# Patient Record
Sex: Female | Born: 1950
Health system: Southern US, Community
[De-identification: ages and names within clinical notes are randomized; demographics above are authoritative.]

## PROBLEM LIST (undated history)

## (undated) DIAGNOSIS — K635 Polyp of colon: Secondary | ICD-10-CM

## (undated) DIAGNOSIS — K219 Gastro-esophageal reflux disease without esophagitis: Secondary | ICD-10-CM

## (undated) DIAGNOSIS — N809 Endometriosis, unspecified: Secondary | ICD-10-CM

## (undated) DIAGNOSIS — J309 Allergic rhinitis, unspecified: Secondary | ICD-10-CM

## (undated) DIAGNOSIS — I10 Essential (primary) hypertension: Secondary | ICD-10-CM

## (undated) DIAGNOSIS — M1990 Unspecified osteoarthritis, unspecified site: Secondary | ICD-10-CM

## (undated) DIAGNOSIS — Z9889 Other specified postprocedural states: Secondary | ICD-10-CM

## (undated) DIAGNOSIS — T7840XA Allergy, unspecified, initial encounter: Secondary | ICD-10-CM

## (undated) DIAGNOSIS — Z5189 Encounter for other specified aftercare: Secondary | ICD-10-CM

## (undated) DIAGNOSIS — D126 Benign neoplasm of colon, unspecified: Secondary | ICD-10-CM

## (undated) DIAGNOSIS — K589 Irritable bowel syndrome without diarrhea: Secondary | ICD-10-CM

## (undated) DIAGNOSIS — E785 Hyperlipidemia, unspecified: Secondary | ICD-10-CM

## (undated) DIAGNOSIS — E119 Type 2 diabetes mellitus without complications: Secondary | ICD-10-CM

## (undated) DIAGNOSIS — Z86010 Personal history of colonic polyps: Secondary | ICD-10-CM

## (undated) DIAGNOSIS — R112 Nausea with vomiting, unspecified: Secondary | ICD-10-CM

## (undated) DIAGNOSIS — H269 Unspecified cataract: Secondary | ICD-10-CM

## (undated) HISTORY — PX: COLONOSCOPY W/ POLYPECTOMY: SHX1380

## (undated) HISTORY — DX: Allergy, unspecified, initial encounter: T78.40XA

## (undated) HISTORY — DX: Gastro-esophageal reflux disease without esophagitis: K21.9

## (undated) HISTORY — DX: Unspecified cataract: H26.9

## (undated) HISTORY — DX: Endometriosis, unspecified: N80.9

## (undated) HISTORY — DX: Essential (primary) hypertension: I10

## (undated) HISTORY — DX: Irritable bowel syndrome, unspecified: K58.9

## (undated) HISTORY — DX: Benign neoplasm of colon, unspecified: D12.6

## (undated) HISTORY — DX: Polyp of colon: K63.5

## (undated) HISTORY — DX: Hyperlipidemia, unspecified: E78.5

## (undated) HISTORY — DX: Allergic rhinitis, unspecified: J30.9

## (undated) HISTORY — DX: Type 2 diabetes mellitus without complications: E11.9

## (undated) HISTORY — DX: Encounter for other specified aftercare: Z51.89

## (undated) HISTORY — DX: Personal history of colonic polyps: Z86.010

## (undated) HISTORY — DX: Unspecified osteoarthritis, unspecified site: M19.90

---
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
## 1983-01-11 HISTORY — PX: MANDIBLE SURGERY: SHX707

## 1984-01-11 HISTORY — PX: LAPAROSCOPY: SHX197

## 1985-01-10 DIAGNOSIS — Z5189 Encounter for other specified aftercare: Secondary | ICD-10-CM

## 1985-01-10 HISTORY — DX: Encounter for other specified aftercare: Z51.89

## 1988-01-11 HISTORY — PX: BREAST BIOPSY: SHX20

## 1997-06-10 ENCOUNTER — Other Ambulatory Visit: Admission: RE | Admit: 1997-06-10 | Discharge: 1997-06-10 | Payer: Self-pay | Admitting: Obstetrics and Gynecology

## 1999-07-26 ENCOUNTER — Other Ambulatory Visit: Admission: RE | Admit: 1999-07-26 | Discharge: 1999-07-26 | Payer: Self-pay | Admitting: Obstetrics and Gynecology

## 2000-12-18 ENCOUNTER — Other Ambulatory Visit: Admission: RE | Admit: 2000-12-18 | Discharge: 2000-12-18 | Payer: Self-pay | Admitting: Obstetrics and Gynecology

## 2002-05-23 ENCOUNTER — Other Ambulatory Visit: Admission: RE | Admit: 2002-05-23 | Discharge: 2002-05-23 | Payer: Self-pay | Admitting: Obstetrics and Gynecology

## 2003-06-03 ENCOUNTER — Other Ambulatory Visit: Admission: RE | Admit: 2003-06-03 | Discharge: 2003-06-03 | Payer: Self-pay | Admitting: Obstetrics and Gynecology

## 2003-06-16 DIAGNOSIS — Z860100 Personal history of colon polyps, unspecified: Secondary | ICD-10-CM

## 2003-06-16 HISTORY — DX: Personal history of colon polyps, unspecified: Z86.0100

## 2003-06-16 HISTORY — DX: Personal history of colonic polyps: Z86.010

## 2003-06-16 LAB — HM COLONOSCOPY: HM Colonoscopy: NORMAL

## 2007-02-12 ENCOUNTER — Ambulatory Visit: Payer: Self-pay | Admitting: Internal Medicine

## 2007-02-12 DIAGNOSIS — J309 Allergic rhinitis, unspecified: Secondary | ICD-10-CM | POA: Insufficient documentation

## 2007-02-12 DIAGNOSIS — Z86010 Personal history of colonic polyps: Secondary | ICD-10-CM

## 2007-02-12 LAB — CONVERTED CEMR LAB
Albumin: 4.3 g/dL (ref 3.5–5.2)
Alkaline Phosphatase: 76 units/L (ref 39–117)
BUN: 19 mg/dL (ref 6–23)
Basophils Absolute: 0 10*3/uL (ref 0.0–0.1)
Cholesterol: 211 mg/dL (ref 0–200)
Creatinine, Ser: 0.8 mg/dL (ref 0.4–1.2)
Direct LDL: 137.5 mg/dL
Eosinophils Absolute: 0.1 10*3/uL (ref 0.0–0.6)
GFR calc Af Amer: 95 mL/min
GFR calc non Af Amer: 79 mL/min
Hemoglobin: 13.5 g/dL (ref 12.0–15.0)
MCHC: 33.2 g/dL (ref 30.0–36.0)
MCV: 91.6 fL (ref 78.0–100.0)
Monocytes Absolute: 0.6 10*3/uL (ref 0.2–0.7)
Monocytes Relative: 8 % (ref 3.0–11.0)
Neutro Abs: 4 10*3/uL (ref 1.4–7.7)
Potassium: 5.4 meq/L — ABNORMAL HIGH (ref 3.5–5.1)
RDW: 12.3 % (ref 11.5–14.6)
Sodium: 143 meq/L (ref 135–145)
TSH: 0.99 microintl units/mL (ref 0.35–5.50)
Total CHOL/HDL Ratio: 4.3

## 2008-05-05 ENCOUNTER — Encounter (INDEPENDENT_AMBULATORY_CARE_PROVIDER_SITE_OTHER): Payer: Self-pay | Admitting: *Deleted

## 2010-04-06 ENCOUNTER — Encounter: Payer: Self-pay | Admitting: Internal Medicine

## 2010-07-19 ENCOUNTER — Other Ambulatory Visit (INDEPENDENT_AMBULATORY_CARE_PROVIDER_SITE_OTHER): Payer: BC Managed Care – PPO

## 2010-07-19 DIAGNOSIS — Z0000 Encounter for general adult medical examination without abnormal findings: Secondary | ICD-10-CM

## 2010-07-19 LAB — CBC WITH DIFFERENTIAL/PLATELET
Basophils Relative: 0.2 % (ref 0.0–3.0)
Eosinophils Absolute: 0.1 10*3/uL (ref 0.0–0.7)
Eosinophils Relative: 0.8 % (ref 0.0–5.0)
Hemoglobin: 13.1 g/dL (ref 12.0–15.0)
Lymphocytes Relative: 35.2 % (ref 12.0–46.0)
MCHC: 34.5 g/dL (ref 30.0–36.0)
MCV: 91.3 fl (ref 78.0–100.0)
Monocytes Absolute: 0.5 10*3/uL (ref 0.1–1.0)
Neutro Abs: 3.8 10*3/uL (ref 1.4–7.7)
Neutrophils Relative %: 56.5 % (ref 43.0–77.0)
RBC: 4.15 Mil/uL (ref 3.87–5.11)
WBC: 6.7 10*3/uL (ref 4.5–10.5)

## 2010-07-19 LAB — TSH: TSH: 1.15 u[IU]/mL (ref 0.35–5.50)

## 2010-07-19 LAB — HEPATIC FUNCTION PANEL
AST: 19 U/L (ref 0–37)
Albumin: 4.5 g/dL (ref 3.5–5.2)
Total Bilirubin: 0.4 mg/dL (ref 0.3–1.2)

## 2010-07-19 LAB — LIPID PANEL
HDL: 43.5 mg/dL (ref >39.00)
LDL Cholesterol: 109 mg/dL — ABNORMAL HIGH (ref 0–99)
Total CHOL/HDL Ratio: 4
Triglycerides: 154 mg/dL — ABNORMAL HIGH (ref 0.0–149.0)

## 2010-07-19 LAB — BASIC METABOLIC PANEL
CO2: 27 mEq/L (ref 19–32)
Chloride: 107 mEq/L (ref 96–112)
Potassium: 4.2 mEq/L (ref 3.5–5.1)
Sodium: 142 mEq/L (ref 135–145)

## 2010-07-19 LAB — POCT URINALYSIS DIPSTICK
Bilirubin, UA: NEGATIVE
Glucose, UA: NEGATIVE
Nitrite, UA: NEGATIVE
Urobilinogen, UA: 0.2

## 2010-07-21 ENCOUNTER — Encounter: Payer: Self-pay | Admitting: Internal Medicine

## 2010-07-26 ENCOUNTER — Ambulatory Visit (INDEPENDENT_AMBULATORY_CARE_PROVIDER_SITE_OTHER): Payer: BC Managed Care – PPO | Admitting: Internal Medicine

## 2010-07-26 ENCOUNTER — Encounter: Payer: Self-pay | Admitting: Internal Medicine

## 2010-07-26 VITALS — BP 138/80 | HR 80 | Temp 98.1°F | Resp 16 | Ht 62.0 in | Wt 145.0 lb

## 2010-07-26 DIAGNOSIS — Z0000 Encounter for general adult medical examination without abnormal findings: Secondary | ICD-10-CM

## 2010-07-26 MED ORDER — FLUTICASONE PROPIONATE 50 MCG/ACT NA SUSP: 2.0000 | Freq: Every day | NASAL | Status: AC

## 2010-07-26 NOTE — Progress Notes (Signed)
Subjective:    Patient ID: Stephanie Grimes, female    DOB: January 02, 1951, 60 y.o.   MRN: [id number]  HPI    History of Present Illness:   60 year-old patient seen today to reestablish with our practice. She has a history of vasomotor rhinitis resulted in rhinorrhea and some chronic cough. Otherwise, she has no concerns or complaints. She did have a colonoscopy with Dr. Jarold Motto in 2007 was did reveal a single polyp. Sees Dr. Katrinka Blazing for gynecologic here annually  Her main complaint is frequent paroxysms of coughing. This is not a new complaint and has been a problem for years. She has been evaluated by allergy and skin test and on 2 occasions has been normal. She was told that antihistamines would not be effective and this has been confirmed clinically. She does seem to benefit from nasal steroids.  Current Allergies:   ! SULF-10 (SULFACETAMIDE SODIUM)   Past Medical History:  Colonic polyps, hx of  vasomotor rhinitis   Past Surgical History:  jaw surgery in 1986  breast biopsy 1987   Family History:  father died age 40, bladder cancer, possibly pancreatic cancer  mother died age 43, lung cancer-nonsmoker  One brother one sister are in good health   Social History:  Retired Engineer, site with a masters degree  Married  presently residing at the household is a 80 year old mother-in-law, a daughter and grandchild; son-in-law is in [place]    Review of Systems  Constitutional: Negative for fever, appetite change, fatigue and unexpected weight change.  HENT: Negative for hearing loss, ear pain, nosebleeds, congestion, sore throat, mouth sores, trouble swallowing, neck stiffness, dental problem, voice change, sinus pressure and tinnitus.   Eyes: Negative for photophobia, pain, redness and visual disturbance.  Respiratory: Negative for cough, chest tightness and shortness of breath.   Cardiovascular: Negative for chest pain, palpitations and leg swelling.  Gastrointestinal: Negative for  nausea, vomiting, abdominal pain, diarrhea, constipation, blood in stool, abdominal distention and rectal pain.  Genitourinary: Negative for dysuria, urgency, frequency, hematuria, flank pain, vaginal bleeding, vaginal discharge, difficulty urinating, genital sores, vaginal pain, menstrual problem and pelvic pain.  Musculoskeletal: Negative for back pain and arthralgias.  Skin: Negative for rash.  Neurological: Negative for dizziness, syncope, speech difficulty, weakness, light-headedness, numbness and headaches.  Hematological: Negative for adenopathy. Does not bruise/bleed easily.  Psychiatric/Behavioral: Negative for suicidal ideas, behavioral problems, self-injury, dysphoric mood and agitation. The patient is not nervous/anxious.        Objective:   Physical Exam  Constitutional: She is oriented to person, place, and time. She appears well-developed and well-nourished.  HENT:  Head: Normocephalic and atraumatic.  Right Ear: External ear normal.  Left Ear: External ear normal.  Mouth/Throat: Oropharynx is clear and moist.  Eyes: Conjunctivae and EOM are normal.  Neck: Normal range of motion. Neck supple. No JVD present. No thyromegaly present.  Cardiovascular: Normal rate, regular rhythm, normal heart sounds and intact distal pulses.   No murmur heard. Pulmonary/Chest: Effort normal and breath sounds normal. She has no wheezes. She has no rales.  Abdominal: Soft. Bowel sounds are normal. She exhibits no distension and no mass. There is no tenderness. There is no rebound and no guarding.  Musculoskeletal: Normal range of motion. She exhibits no edema and no tenderness.  Neurological: She is alert and oriented to person, place, and time. She has normal reflexes. No cranial nerve deficit. She exhibits normal muscle tone. Coordination normal.  Skin: Skin is warm and dry. No rash noted.  Psychiatric: She has a normal mood and affect. Her behavior is normal.          Assessment & Plan:     Annual health examination History colonic polyps.  With our GI contacted the patient earlier this year after a five-year interval and told her she did not need a followup examination at this time. I suspect the patient perhaps had a single hyperplastic polyp 5 years ago. She will call the GI office to further investigate

## 2010-07-26 NOTE — Patient Instructions (Signed)
It is important that you exercise regularly, at least 20 minutes 3 to 4 times per week.  If you develop chest pain or shortness of breath seek  medical attention.  Call or return to clinic prn if these symptoms worsen or fail to improve as anticipated.  

## 2010-09-01 ENCOUNTER — Encounter: Payer: Self-pay | Admitting: Gastroenterology

## 2010-09-23 ENCOUNTER — Encounter: Payer: Self-pay | Admitting: *Deleted

## 2010-09-24 ENCOUNTER — Encounter: Payer: Self-pay | Admitting: Gastroenterology

## 2010-09-24 ENCOUNTER — Ambulatory Visit (INDEPENDENT_AMBULATORY_CARE_PROVIDER_SITE_OTHER): Payer: BC Managed Care – PPO | Admitting: Gastroenterology

## 2010-09-24 VITALS — BP 128/80 | HR 76 | Ht 62.0 in | Wt 143.0 lb

## 2010-09-24 DIAGNOSIS — R0989 Other specified symptoms and signs involving the circulatory and respiratory systems: Secondary | ICD-10-CM

## 2010-09-24 DIAGNOSIS — K219 Gastro-esophageal reflux disease without esophagitis: Secondary | ICD-10-CM | POA: Insufficient documentation

## 2010-09-24 DIAGNOSIS — I7300 Raynaud's syndrome without gangrene: Secondary | ICD-10-CM

## 2010-09-24 DIAGNOSIS — F458 Other somatoform disorders: Secondary | ICD-10-CM

## 2010-09-24 MED ORDER — RABEPRAZOLE SODIUM 20 MG PO TBEC: 20.0000 mg | DELAYED_RELEASE_TABLET | Freq: Every day | ORAL | Status: DC

## 2010-09-24 NOTE — Progress Notes (Signed)
History of Present Illness:  This is a 60 year old Caucasian female with several months of a globus sensation in her throat, hoarseness, and coughing without true reflux symptoms. Allergy workup has been normal, chest x-ray normal, she otherwise is in good health. There is no history of dysphagia, anorexia, weight loss, or any systemic or hepatobiliary symptoms. She has not had previous endoscopy or barium swallows. She does have a past history of irritable bowel syndrome. Previous colonoscopy was unremarkable. On close questioning, the patient does seem to have Raynaud's phenomenon but no real arthritis or recurrent skin rashes. She does suffer from recurrent oral stomatitis. She is up-to-date on her dental exams. She follows a regular diet and has no specific food intolerances.  I have reviewed this patient's present history, medical and surgical past history, allergies and medications.     ROS: The remainder of the 10 point ROS is negative     Physical Exam: Examination of the oral pharyngeal area is unremarkable General well developed well nourished patient in no acute distress, appearing her stated age Eyes PERRLA, no icterus, fundoscopic exam per opthamologist Skin no lesions noted Neck supple, no adenopathy, no thyroid enlargement, no tenderness Chest clear to percussion and auscultation Heart no significant murmurs, gallops or rubs noted Abdomen no hepatosplenomegaly masses or tenderness, BS normal. . Extremities no acute joint lesions, edema, phlebitis or evidence of cellulitis. Neurologic patient oriented x 3, cranial nerves intact, no focal neurologic deficits noted. Psychological mental status normal and normal affect.  Assessment and plan: Possible Raynaud's phenomenon with associated esophageal motility disturbance that leads to chronic GERD and hypersensitive esophagus. With a normal chest x-ray, negative allergy workup, and no history of ACE medications, it is highly likely  that her cough is related to GERD. I reviewed her reflux regime with this patient and will start AcipHex 20 mg a day. She's been advised to take this before the first meal of the day. Previous therapy with omeprazole caused severe diarrhea. If she does well I will see her back in 2 months time, and explained to her that these atypical GERD manifestations require months of regular acid suppressive therapy to improve her symptoms. Colonoscopy followup as per clinical protocol.  No diagnosis found.

## 2010-09-24 NOTE — Patient Instructions (Signed)
Take Aciphex once a day 30 min before breakfast. Make an office visit for 2 months to see Dr Jarold Motto  Gastroesophageal Reflux Disease (GERD) Your caregiver has diagnosed your chest discomfort as caused by gastroesophageal reflux disease (GERD). GERD is caused by a reflux of acid from your stomach into the digestive tube between your mouth and stomach (esophagus). Acid in contact with the esophagus causes soreness (inflammation) resulting in heartburn or chest pain. It may cause small holes in the lining of the esophagus (ulcers). CAUSES  Increased body weight puts pressure on the stomach, making acid rise.   Smoking increases acid production.   Alcohol decreases pressure on the valve between the stomach and esophagus (lower esophageal sphincter), allowing acid from the stomach into the esophagus.   Late evening meals and a full stomach increase pressure and acid production.   Lower esophageal sphincter is malformed.   Sometimes, no reason is found.  HOME CARE INSTRUCTIONS  Change the factors that you can control. Weight, smoking, or alcohol changes may be difficult to change on your own. Your caregiver can provide guidance and medical therapy.   Raising the head of your bed may help you to sleep.   Over-the-counter medicines will decrease acid production. Your caregiver can also prescribe medicines for this. Only take over-the-counter or prescription medicines for pain, discomfort, or fever as directed by your caregiver.   1/2 to 1 teaspoon of an antacid taken every hour while awake, with meals, and at bedtime, can neutralize acid.   DO NOT take aspirin, ibuprofen, or other nonsteroidal anti-inflammatory drugs (NSAIDs).  SEEK IMMEDIATE MEDICAL CARE IF:  The pain changes in location (radiates into arms, neck, jaw, teeth, or back), intensity, or duration.   You start feeling sick to your stomach (nauseous), start throwing up (vomiting), or sweating (diaphoresis).   You develop left  arm or jaw pain.   You develop pain going into your back, shortness of breath, or you pass out.   There is vomiting of fluid that is green, yellow, or looks like coffee grounds or blood.  These symptoms could signal other problems, such as heart disease. MAKE SURE YOU:  Understand these instructions.   Will watch your condition.   Will get help right away if you are not doing well or get worse.  Document Released: 10/06/2004 Document Re-Released: 03/23/2009 [facility] Patient Information 2011 [place], [place].    Diet for GERD or PUD Nutrition therapy can help ease the discomfort of gastroesophageal reflux disease (GERD) and peptic ulcer disease (PUD).  HOME CARE INSTRUCTIONS  Eat your meals slowly, in a relaxed setting.   Eat 5 to 6 small meals per day.   If a food causes distress, stop eating it for a period of time.  FOODS TO AVOID:  Coffee, regular or decaffeinated.  Cola beverages, regular or low calorie.   Tea, regular or decaffeinated.   Pepper.   Cocoa.   High fat foods including meats.   Butter, margarine, hydrogenated oil (trans fats).  Peppermint or spearmint (if you have GERD).   Fruits and vegetables as tolerated.   Alcoholic beverages.   Nicotine (smoking or chewing). This is one of the most potent stimulants to acid production in the gastrointestinal tract.   Any food that seems to aggravate your condition.   If you have questions regarding your diet, call your caregiver's office or a registered dietitian. OTHER TIPS IF YOU HAVE GERD:  Lying flat may make symptoms worse. Keep the head of your bed  raised 6 to 9 inches by using a foam wedge or blocks under the legs of the bed.   Do not lay down until 3 hours after eating a meal.   Daily physical activity may help reduce symptoms.  MAKE SURE YOU:   Understand these instructions.   Will watch your condition.   Will get help right away if you are not doing well or get worse.  Document Released:  12/27/2004 Document Re-Released: 05/15/2008 [facility] Patient Information 2011 [place], [place].

## 2010-09-27 ENCOUNTER — Encounter: Payer: Self-pay | Admitting: Gastroenterology

## 2013-05-31 ENCOUNTER — Other Ambulatory Visit (INDEPENDENT_AMBULATORY_CARE_PROVIDER_SITE_OTHER): Payer: BC Managed Care – PPO

## 2013-05-31 DIAGNOSIS — Z0000 Encounter for general adult medical examination without abnormal findings: Secondary | ICD-10-CM

## 2013-05-31 LAB — BASIC METABOLIC PANEL
BUN: 20 mg/dL (ref 6–23)
CALCIUM: 9.2 mg/dL (ref 8.4–10.5)
CO2: 28 mEq/L (ref 19–32)
Chloride: 106 mEq/L (ref 96–112)
Creatinine, Ser: 0.7 mg/dL (ref 0.4–1.2)
GFR: 97.91 mL/min (ref >60.00)
GLUCOSE: 115 mg/dL — AB (ref 70–99)
Potassium: 4.1 mEq/L (ref 3.5–5.1)
SODIUM: 141 meq/L (ref 135–145)

## 2013-05-31 LAB — CBC WITH DIFFERENTIAL/PLATELET
BASOS ABS: 0 10*3/uL (ref 0.0–0.1)
Basophils Relative: 0.4 % (ref 0.0–3.0)
Eosinophils Absolute: 0.1 10*3/uL (ref 0.0–0.7)
Eosinophils Relative: 1.4 % (ref 0.0–5.0)
HEMATOCRIT: 38.8 % (ref 36.0–46.0)
Hemoglobin: 13.1 g/dL (ref 12.0–15.0)
LYMPHS ABS: 2.4 10*3/uL (ref 0.7–4.0)
Lymphocytes Relative: 33.6 % (ref 12.0–46.0)
MCHC: 33.8 g/dL (ref 30.0–36.0)
MCV: 91.6 fl (ref 78.0–100.0)
MONO ABS: 0.5 10*3/uL (ref 0.1–1.0)
Monocytes Relative: 7.3 % (ref 3.0–12.0)
Neutro Abs: 4.1 10*3/uL (ref 1.4–7.7)
Neutrophils Relative %: 57.3 % (ref 43.0–77.0)
PLATELETS: 232 10*3/uL (ref 150.0–400.0)
RBC: 4.24 Mil/uL (ref 3.87–5.11)
RDW: 13.6 % (ref 11.5–15.5)
WBC: 7.2 10*3/uL (ref 4.0–10.5)

## 2013-05-31 LAB — LIPID PANEL
Cholesterol: 198 mg/dL (ref 0–200)
HDL: 47 mg/dL (ref >39.00)
LDL Cholesterol: 127 mg/dL — ABNORMAL HIGH (ref 0–99)
TRIGLYCERIDES: 121 mg/dL (ref 0.0–149.0)
Total CHOL/HDL Ratio: 4
VLDL: 24.2 mg/dL (ref 0.0–40.0)

## 2013-05-31 LAB — POCT URINALYSIS DIPSTICK
BILIRUBIN UA: NEGATIVE
Glucose, UA: NEGATIVE
KETONES UA: NEGATIVE
Nitrite, UA: POSITIVE
PH UA: 5
Protein, UA: NEGATIVE
Spec Grav, UA: 1.025
Urobilinogen, UA: 0.2

## 2013-05-31 LAB — HEPATIC FUNCTION PANEL
ALK PHOS: 64 U/L (ref 39–117)
ALT: 20 U/L (ref 0–35)
AST: 16 U/L (ref 0–37)
Albumin: 3.9 g/dL (ref 3.5–5.2)
BILIRUBIN DIRECT: 0 mg/dL (ref 0.0–0.3)
TOTAL PROTEIN: 6.8 g/dL (ref 6.0–8.3)
Total Bilirubin: 0.6 mg/dL (ref 0.2–1.2)

## 2013-05-31 LAB — TSH: TSH: 0.93 u[IU]/mL (ref 0.35–4.50)

## 2013-06-07 ENCOUNTER — Ambulatory Visit (INDEPENDENT_AMBULATORY_CARE_PROVIDER_SITE_OTHER): Payer: BC Managed Care – PPO | Admitting: Internal Medicine

## 2013-06-07 ENCOUNTER — Encounter: Payer: Self-pay | Admitting: Internal Medicine

## 2013-06-07 VITALS — BP 160/90 | HR 80 | Temp 98.2°F | Resp 20 | Ht 61.5 in | Wt 144.0 lb

## 2013-06-07 DIAGNOSIS — Z23 Encounter for immunization: Secondary | ICD-10-CM

## 2013-06-07 DIAGNOSIS — K219 Gastro-esophageal reflux disease without esophagitis: Secondary | ICD-10-CM

## 2013-06-07 DIAGNOSIS — I7300 Raynaud's syndrome without gangrene: Secondary | ICD-10-CM

## 2013-06-07 DIAGNOSIS — J309 Allergic rhinitis, unspecified: Secondary | ICD-10-CM

## 2013-06-07 DIAGNOSIS — Z0000 Encounter for general adult medical examination without abnormal findings: Secondary | ICD-10-CM

## 2013-06-07 DIAGNOSIS — Z86010 Personal history of colonic polyps: Secondary | ICD-10-CM

## 2013-06-07 DIAGNOSIS — R7302 Impaired glucose tolerance (oral): Secondary | ICD-10-CM | POA: Insufficient documentation

## 2013-06-07 DIAGNOSIS — R7309 Other abnormal glucose: Secondary | ICD-10-CM

## 2013-06-07 NOTE — Progress Notes (Signed)
   Subjective:    Patient ID: Stephanie Grimes, female    DOB: 05/14/1950, 63 y.o.   MRN: [id number]  HPI 63 year-old patient seen today for a health maintenance exam.  She has a history of vasomotor rhinitis with  rhinorrhea and some chronic cough. Otherwise, she has no concerns or complaints. She did have a colonoscopy with Dr. Sharlett Iles in 2005  was did reveal a single polyp. Sees Dr. Tamala Julian for gynecologic here annually   Current Allergies:  ! SULF-10 (SULFACETAMIDE SODIUM)   Past Medical History:  Colonic polyps, hx of  vasomotor rhinitis  GERD IGT  Past Surgical History:  jaw surgery in 1986  breast biopsy 1987   Family History:  father died age 63, bladder cancer, possibly pancreatic cancer  mother died age 49, lung cancer-nonsmoker  One brother one sister are in good health    Social History:  Retired Education officer, museum with a masters degree  Married 2 grandchildren      Review of Systems  Constitutional: Negative for fever, appetite change, fatigue and unexpected weight change.  HENT: Positive for postnasal drip. Negative for congestion, dental problem, ear pain, hearing loss, mouth sores, nosebleeds, sinus pressure, sore throat, tinnitus, trouble swallowing and voice change.   Eyes: Negative for photophobia, pain, redness and visual disturbance.  Respiratory: Positive for cough. Negative for chest tightness and shortness of breath.   Cardiovascular: Negative for chest pain, palpitations and leg swelling.  Gastrointestinal: Negative for nausea, vomiting, abdominal pain, diarrhea, constipation, blood in stool, abdominal distention and rectal pain.  Genitourinary: Negative for dysuria, urgency, frequency, hematuria, flank pain, vaginal bleeding, vaginal discharge, difficulty urinating, genital sores, vaginal pain, menstrual problem and pelvic pain.  Musculoskeletal: Negative for arthralgias, back pain and neck stiffness.  Skin: Negative for rash.  Neurological: Negative for  dizziness, syncope, speech difficulty, weakness, light-headedness, numbness and headaches.  Hematological: Negative for adenopathy. Does not bruise/bleed easily.  Psychiatric/Behavioral: Negative for suicidal ideas, behavioral problems, self-injury, dysphoric mood and agitation. The patient is not nervous/anxious.        Objective:   Physical Exam  Constitutional: She is oriented to person, place, and time. She appears well-developed and well-nourished.  HENT:  Head: Normocephalic and atraumatic.  Right Ear: External ear normal.  Left Ear: External ear normal.  Mouth/Throat: Oropharynx is clear and moist.  Eyes: Conjunctivae and EOM are normal.  Neck: Normal range of motion. Neck supple. No JVD present. No thyromegaly present.  Cardiovascular: Normal rate, regular rhythm, normal heart sounds and intact distal pulses.   No murmur heard. Pulmonary/Chest: Effort normal and breath sounds normal. She has no wheezes. She has no rales.  Abdominal: Soft. Bowel sounds are normal. She exhibits no distension and no mass. There is no tenderness. There is no rebound and no guarding.  Musculoskeletal: Normal range of motion. She exhibits no edema and no tenderness.  Neurological: She is alert and oriented to person, place, and time. She has normal reflexes. No cranial nerve deficit. She exhibits normal muscle tone. Coordination normal.  Skin: Skin is warm and dry. No rash noted.  Psychiatric: She has a normal mood and affect. Her behavior is normal.          Assessment & Plan:  Preventive health examination Vasomotor rhinitis.  Continue nasal steroids  Needs  follow up colonoscopy

## 2013-06-07 NOTE — Progress Notes (Signed)
Pre-visit discussion using our clinic review tool. No additional management support is needed unless otherwise documented below in the visit note.  

## 2013-06-07 NOTE — Patient Instructions (Addendum)
Health Maintenance, Female A healthy lifestyle and preventative care can promote health and wellness.  Maintain regular health, dental, and eye exams.  Eat a healthy diet. Foods like vegetables, fruits, whole grains, low-fat dairy products, and lean protein foods contain the nutrients you need without too many calories. Decrease your intake of foods high in solid fats, added sugars, and salt. Get information about a proper diet from your caregiver, if necessary.  Regular physical exercise is one of the most important things you can do for your health. Most adults should get at least 150 minutes of moderate-intensity exercise (any activity that increases your heart rate and causes you to sweat) each week. In addition, most adults need muscle-strengthening exercises on 2 or more days a week.   Maintain a healthy weight. The body mass index (BMI) is a screening tool to identify possible weight problems. It provides an estimate of body fat based on height and weight. Your caregiver can help determine your BMI, and can help you achieve or maintain a healthy weight. For adults 20 years and older:  A BMI below 18.5 is considered underweight.  A BMI of 18.5 to 24.9 is normal.  A BMI of 25 to 29.9 is considered overweight.  A BMI of 30 and above is considered obese.  Maintain normal blood lipids and cholesterol by exercising and minimizing your intake of saturated fat. Eat a balanced diet with plenty of fruits and vegetables. Blood tests for lipids and cholesterol should begin at age 45 and be repeated every 5 years. If your lipid or cholesterol levels are high, you are over 50, or you are a high risk for heart disease, you may need your cholesterol levels checked more frequently.Ongoing high lipid and cholesterol levels should be treated with medicines if diet and exercise are not effective.  If you smoke, find out from your caregiver how to quit. If you do not use tobacco, do not start.  Lung  cancer screening is recommended for adults aged 63 80 years who are at high risk for developing lung cancer because of a history of smoking. Yearly low-dose computed tomography (CT) is recommended for people who have at least a 30-pack-year history of smoking and are a current smoker or have quit within the past 15 years. A pack year of smoking is smoking an average of 1 pack of cigarettes a day for 1 year (for example: 1 pack a day for 30 years or 2 packs a day for 15 years). Yearly screening should continue until the smoker has stopped smoking for at least 15 years. Yearly screening should also be stopped for people who develop a health problem that would prevent them from having lung cancer treatment.  If you are pregnant, do not drink alcohol. If you are breastfeeding, be very cautious about drinking alcohol. If you are not pregnant and choose to drink alcohol, do not exceed 1 drink per day. One drink is considered to be 12 ounces (355 mL) of beer, 5 ounces (148 mL) of wine, or 1.5 ounces (44 mL) of liquor.  Avoid use of street drugs. Do not share needles with anyone. Ask for help if you need support or instructions about stopping the use of drugs.  High blood pressure causes heart disease and increases the risk of stroke. Blood pressure should be checked at least every 1 to 2 years. Ongoing high blood pressure should be treated with medicines, if weight loss and exercise are not effective.  If you are 55 to  63 years old, ask your caregiver if you should take aspirin to prevent strokes.  Diabetes screening involves taking a blood sample to check your fasting blood sugar level. This should be done once every 3 years, after age 10, if you are within normal weight and without risk factors for diabetes. Testing should be considered at a younger age or be carried out more frequently if you are overweight and have at least 1 risk factor for diabetes.  Breast cancer screening is essential preventative care  for women. You should practice "breast self-awareness." This means understanding the normal appearance and feel of your breasts and may include breast self-examination. Any changes detected, no matter how small, should be reported to a caregiver. Women in their 56s and 30s should have a clinical breast exam (CBE) by a caregiver as part of a regular health exam every 1 to 3 years. After age 41, women should have a CBE every year. Starting at age 2, women should consider having a mammogram (breast X-ray) every year. Women who have a family history of breast cancer should talk to their caregiver about genetic screening. Women at a high risk of breast cancer should talk to their caregiver about having an MRI and a mammogram every year.  Breast cancer gene (BRCA)-related cancer risk assessment is recommended for women who have family members with BRCA-related cancers. BRCA-related cancers include breast, ovarian, tubal, and peritoneal cancers. Having family members with these cancers may be associated with an increased risk for harmful changes (mutations) in the breast cancer genes BRCA1 and BRCA2. Results of the assessment will determine the need for genetic counseling and BRCA1 and BRCA2 testing.  The Pap test is a screening test for cervical cancer. Women should have a Pap test starting at age 30. Between ages 53 and 36, Pap tests should be repeated every 2 years. Beginning at age 25, you should have a Pap test every 3 years as long as the past 3 Pap tests have been normal. If you had a hysterectomy for a problem that was not cancer or a condition that could lead to cancer, then you no longer need Pap tests. If you are between ages 34 and 44, and you have had normal Pap tests going back 10 years, you no longer need Pap tests. If you have had past treatment for cervical cancer or a condition that could lead to cancer, you need Pap tests and screening for cancer for at least 20 years after your treatment. If Pap  tests have been discontinued, risk factors (such as a new sexual partner) need to be reassessed to determine if screening should be resumed. Some women have medical problems that increase the chance of getting cervical cancer. In these cases, your caregiver may recommend more frequent screening and Pap tests.  The human papillomavirus (HPV) test is an additional test that may be used for cervical cancer screening. The HPV test looks for the virus that can cause the cell changes on the cervix. The cells collected during the Pap test can be tested for HPV. The HPV test could be used to screen women aged 68 years and older, and should be used in women of any age who have unclear Pap test results. After the age of 26, women should have HPV testing at the same frequency as a Pap test.  Colorectal cancer can be detected and often prevented. Most routine colorectal cancer screening begins at the age of 24 and continues through age 27. However, your caregiver  may recommend screening at an earlier age if you have risk factors for colon cancer. On a yearly basis, your caregiver may provide home test kits to check for hidden blood in the stool. Use of a small camera at the end of a tube, to directly examine the colon (sigmoidoscopy or colonoscopy), can detect the earliest forms of colorectal cancer. Talk to your caregiver about this at age 48, when routine screening begins. Direct examination of the colon should be repeated every 5 to 10 years through age 57, unless early forms of pre-cancerous polyps or small growths are found.  Hepatitis C blood testing is recommended for all people born from [birth year] through 1965 and any individual with known risks for hepatitis C.  Practice safe sex. Use condoms and avoid high-risk sexual practices to reduce the spread of sexually transmitted infections (STIs). Sexually active women aged 74 and younger should be checked for Chlamydia, which is a common sexually transmitted infection.  Older women with new or multiple partners should also be tested for Chlamydia. Testing for other STIs is recommended if you are sexually active and at increased risk.  Osteoporosis is a disease in which the bones lose minerals and strength with aging. This can result in serious bone fractures. The risk of osteoporosis can be identified using a bone density scan. Women ages 21 and over and women at risk for fractures or osteoporosis should discuss screening with their caregivers. Ask your caregiver whether you should be taking a calcium supplement or vitamin D to reduce the rate of osteoporosis.  Menopause can be associated with physical symptoms and risks. Hormone replacement therapy is available to decrease symptoms and risks. You should talk to your caregiver about whether hormone replacement therapy is right for you.  Use sunscreen. Apply sunscreen liberally and repeatedly throughout the day. You should seek shade when your shadow is shorter than you. Protect yourself by wearing long sleeves, pants, a wide-brimmed hat, and sunglasses year round, whenever you are outdoors.  Notify your caregiver of new moles or changes in moles, especially if there is a change in shape or color. Also notify your caregiver if a mole is larger than the size of a pencil eraser.  Stay current with your immunizations. Document Released: 07/12/2010 Document Revised: 04/23/2012 Document Reviewed: 07/12/2010 [facility] Patient Information 2014 [place].  Schedule your colonoscopy to help detect colon cancer.

## 2013-06-12 ENCOUNTER — Encounter: Payer: Self-pay | Admitting: Internal Medicine

## 2013-07-09 ENCOUNTER — Encounter: Payer: Self-pay | Admitting: Gynecology

## 2013-07-18 ENCOUNTER — Ambulatory Visit (AMBULATORY_SURGERY_CENTER): Payer: BC Managed Care – PPO | Admitting: *Deleted

## 2013-07-18 VITALS — Ht 61.75 in | Wt 143.6 lb

## 2013-07-18 DIAGNOSIS — Z1211 Encounter for screening for malignant neoplasm of colon: Secondary | ICD-10-CM

## 2013-07-18 NOTE — Progress Notes (Signed)
No allergies to eggs or soy. No problems with anesthesia.  No oxygen use  No diet drug use  During PV pt says she is having problems with reflux and chronic cough due to reflux. She is taking Pepcid and says that cough is getting worse.  She says that she would like to see Dr Hilarie Fredrickson about this problem.  Colonoscopy cancelled and New pt appt scheduled for 9/15 with Dr. Hilarie Fredrickson.

## 2013-07-24 ENCOUNTER — Encounter: Payer: Self-pay | Admitting: Gynecology

## 2013-07-24 ENCOUNTER — Ambulatory Visit (INDEPENDENT_AMBULATORY_CARE_PROVIDER_SITE_OTHER): Payer: BC Managed Care – PPO | Admitting: Gynecology

## 2013-07-24 VITALS — BP 118/84 | HR 72 | Resp 16 | Ht 61.0 in | Wt 142.0 lb

## 2013-07-24 DIAGNOSIS — Z01419 Encounter for gynecological examination (general) (routine) without abnormal findings: Secondary | ICD-10-CM

## 2013-07-24 DIAGNOSIS — N762 Acute vulvitis: Secondary | ICD-10-CM

## 2013-07-24 DIAGNOSIS — R053 Chronic cough: Secondary | ICD-10-CM

## 2013-07-24 DIAGNOSIS — Z124 Encounter for screening for malignant neoplasm of cervix: Secondary | ICD-10-CM

## 2013-07-24 DIAGNOSIS — N760 Acute vaginitis: Secondary | ICD-10-CM

## 2013-07-24 DIAGNOSIS — E2839 Other primary ovarian failure: Secondary | ICD-10-CM

## 2013-07-24 DIAGNOSIS — N3941 Urge incontinence: Secondary | ICD-10-CM

## 2013-07-24 DIAGNOSIS — R05 Cough: Secondary | ICD-10-CM

## 2013-07-24 DIAGNOSIS — R059 Cough, unspecified: Secondary | ICD-10-CM

## 2013-07-24 NOTE — Patient Instructions (Addendum)
cetaphil liquid to vulvar area. Avoid harsh detergents and sprays.  Cotton underwear, keep area dry-avoid pantishields, incontinence pads absorb better, change from moist clothing when able Blow dry on cool setting after bathing. May use a small amount of aveeno in cup to make a slurry and pour over for relief of burning No loofa or wash cloths May use cocoanut oil or EVOO to keep area moist, vit E

## 2013-07-24 NOTE — Progress Notes (Signed)
63 y.o. Married Caucasian female   G1P1001 here for annual exam. Pt reports menses are absent due to Menopause.  She does not report hot flashes, does not have night sweats, does have vaginal dryness.  She is not using lubricants [place] Jelly.  She does not report post-menopasual bleeding.  Pt reports 6w h/o vulvar itching/pinching, worse at end of day, no discharge, no change in medications, detergents.  Pt reports slight loss of urine with urgency and has a cough that is reflux related.  Pt may wear a pantishield or menstrual pad. occassionally will need will to change.  Sex makes itching worse.    Patient's last menstrual period was 01/10/1998.          Sexually active: Yes.    The current method of family planning is post menopausal status.    Exercising: Yes.    walking 3x/wk Last pap: 4 years ago- WNL  Abnormal PAP: no Mammogram: 04/2013- Normal BSE: yes   Colonoscopy: June 2005- Polyp f/u in 10 years ago  DEXA:  Never had one  Alcohol: 3 drinks/wk (wine with dinner) Tobacco: no  Labs: Bluford Kaufmann, MD  Health Maintenance  Topic Date Due  . Pap Smear  08/07/1968  . Zostavax  08/08/2010  . Colonoscopy  06/15/2013  . Influenza Vaccine  08/10/2013  . Mammogram  02/05/2014  . Tetanus/tdap  06/08/2023    Family History  Problem Relation Age of Onset  . Lung cancer Mother     non smoker  . Cancer Father     bladder that spread to pancreas  . Heart attack Father   . Diabetes Father   . Colon polyps Father   . Colon cancer Neg Hx   . Prostate cancer Brother   . Diabetes Paternal Grandfather   . Heart disease Maternal Grandmother   . Heart disease Paternal Grandmother   . Hypertension Maternal Grandmother   . Hypertension Paternal Grandmother   . Osteoporosis Maternal Grandmother     Patient Active Problem List   Diagnosis Date Noted  . Impaired glucose tolerance 06/07/2013  . Esophageal reflux 09/24/2010  . Globus sensation 09/24/2010  . Raynauds phenomenon  09/24/2010  . VASOMOTOR RHINITIS 02/12/2007  . COLONIC POLYPS, HX OF 02/12/2007    Past Medical History  Diagnosis Date  . Personal history of colonic polyps 06/16/2003    hyperplastic  . VASOMOTOR RHINITIS   . IBS (irritable bowel syndrome)   . Arthritis   . Cataract   . GERD (gastroesophageal reflux disease)   . Blood transfusion without reported diagnosis 1987    after childbirth  . Endometriosis     Past Surgical History  Procedure Laterality Date  . Breast biopsy Right 1990  . Mandible surgery  1985  . Tonsillectomy  1957  . Maxillary cyst excision Left 1968    Allergies: Omeprazole and Sulfa antibiotics  Current Outpatient Prescriptions  Medication Sig Dispense Refill  . Calcium Carbonate-Vitamin D (CALCIUM 600+D) 600-200 MG-UNIT TABS Take 1 tablet by mouth daily.        . Famotidine (PEPCID PO) Take by mouth daily.      . fluticasone (FLONASE) 50 MCG/ACT nasal spray Place 2 sprays into the nose daily.  16 g  6  . Multiple Vitamin (MULTIVITAMIN) capsule Take 1 capsule by mouth daily.         No current facility-administered medications for this visit.    ROS: Pertinent items are noted in HPI.  Exam:    BP 118/84  Pulse 72  Resp 16  Ht 5\' 1"  (1.549 m)  Wt 142 lb (64.411 kg)  BMI 26.84 kg/m2  LMP 01/10/1998 Weight change: @WEIGHTCHANGE @ Last 3 height recordings:  Ht Readings from Last 3 Encounters:  07/24/13 5\' 1"  (1.549 m)  07/18/13 5' 1.75" (1.568 m)  06/07/13 5' 1.5" (1.562 m)   General appearance: alert, cooperative and appears stated age Head: Normocephalic, without obvious abnormality, atraumatic Neck: no adenopathy, no carotid bruit, no JVD, supple, symmetrical, trachea midline and thyroid not enlarged, symmetric, no tenderness/mass/nodules Lungs: clear to auscultation bilaterally Breasts: normal appearance, no masses or tenderness Heart: regular rate and rhythm, S1, S2 normal, no murmur, click, rub or gallop Abdomen: soft, non-tender; bowel  sounds normal; no masses,  no organomegaly Extremities: extremities normal, atraumatic, no cyanosis or edema Skin: Skin color, texture, turgor normal. No rashes or lesions Lymph nodes: Cervical, supraclavicular, and axillary nodes normal. no inguinal nodes palpated Neurologic: Grossly normal   Pelvic: External genitalia:  no lesions and postmenopausal changes              Urethra: normal appearing urethra with no masses, tenderness or lesions              Bartholins and Skenes: Bartholin's, Urethra, Skene's normal                 Vagina: atrophic, cystocele, grade II with valsalva              Cervix: normal appearance              Pap taken: Yes.          Bimanual Exam:  Uterus:  uterus is normal size, shape, consistency and nontender, mod descent with valsalva                                      Adnexa:    no masses                                      Rectovaginal: Confirms, rectocele                                      Anus:  normal sphincter tone, no lesions     1. Routine gynecological examination counseled on breast self exam, mammography screening, osteoporosis, adequate intake of calcium and vitamin D, diet and exercise return annually or prn Discussed PAP guideline changes, importance of weight bearing exercises, calcium, vit D and balanced diet.   2. Screening for cervical cancer  - Pap Test with HP (IPS)  3. Vulvitis No lesions appreciated today, may be related to small amounts of urine loss with cough Genital hygiene discussed Vulvar lubricants to protect skin  4. Urge incontinence of urine Most likely related to cough, recommend continence pads over menstrual  5. Estrogen deficiency Baseline DEXA - DG Bone Density; Future  6. Chronic cough Pelvic laxity associated with recurrent valsalva reviewed, expect improvement after cough resolves, can consider pessary if symptoms worsen   An After Visit Summary was printed and given to the patient.

## 2013-07-26 LAB — IPS PAP TEST WITH HPV

## 2013-07-30 ENCOUNTER — Encounter: Payer: BC Managed Care – PPO | Admitting: Internal Medicine

## 2013-09-24 ENCOUNTER — Encounter: Payer: Self-pay | Admitting: Internal Medicine

## 2013-09-24 ENCOUNTER — Ambulatory Visit: Payer: BC Managed Care – PPO | Admitting: Internal Medicine

## 2013-09-24 ENCOUNTER — Ambulatory Visit (INDEPENDENT_AMBULATORY_CARE_PROVIDER_SITE_OTHER): Payer: BC Managed Care – PPO | Admitting: Internal Medicine

## 2013-09-24 VITALS — BP 150/90 | HR 78 | Ht 61.0 in | Wt 142.8 lb

## 2013-09-24 DIAGNOSIS — Z1211 Encounter for screening for malignant neoplasm of colon: Secondary | ICD-10-CM

## 2013-09-24 DIAGNOSIS — R059 Cough, unspecified: Secondary | ICD-10-CM

## 2013-09-24 DIAGNOSIS — R05 Cough: Secondary | ICD-10-CM

## 2013-09-24 DIAGNOSIS — K219 Gastro-esophageal reflux disease without esophagitis: Secondary | ICD-10-CM

## 2013-09-24 DIAGNOSIS — R053 Chronic cough: Secondary | ICD-10-CM

## 2013-09-24 MED ORDER — MOVIPREP 100 G PO SOLR
1.0000 | Freq: Once | ORAL | Status: DC
Start: 2013-09-24 — End: 2013-10-23

## 2013-09-24 MED ORDER — PANTOPRAZOLE SODIUM 40 MG PO TBEC: 40.0000 mg | DELAYED_RELEASE_TABLET | Freq: Every day | ORAL | Status: DC

## 2013-09-24 NOTE — Patient Instructions (Addendum)
You have been scheduled for an endoscopy and colonoscopy. Please follow the written instructions given to you at your visit today. Please pick up your prep at the pharmacy within the next 1-3 days. If you use inhalers (even only as needed), please bring them with you on the day of your procedure. Your physician has requested that you go to www.[email or web address] and enter the access code given to you at your visit today. This web site gives a general overview about your procedure. However, you should still follow specific instructions given to you by our office regarding your preparation for the procedure. We have sent the following medications to your pharmacy for you to pick up at your convenience: Pantoprazole 40 mg 20-30 minutes prior to breakfast.   Stop the Pepcid. CC:  Bluford Kaufmann MD

## 2013-09-24 NOTE — Progress Notes (Signed)
Patient ID: Stephanie Grimes, female   DOB: 04-30-1950, 63 y.o.   MRN: [id number] HPI: Stephanie Grimes is a 63 year old female with a past medical history of GERD and chronic cough who is seen to evaluate chronic cough and possible reflux disease and also discuss repeat screening colonoscopy. She is here alone today. She was previously seen by Dr. Verl Blalock in September 2012 to evaluate globus sensation, hoarseness and coughing without true reflux symptoms. She is previously had an allergy workup, normal chest x-ray and had remained in good health.  She reports the cough has continued and he can be quite severe. She denies true heartburn though this will occur on occasion. She tries to watch her diet because overeating makes her symptoms worse including bloating. She occasionally has a foul taste or sour taste in her mouth. Occasional nausea but no vomiting. She is using Pepcid once daily in the morning. Cough seems to be worse in the evening and so she feels Pepcid has benefited her to some degree. Occasionally she will have a coughing "spasm" just after lying down and after waking in the morning, though cough has not awakened her from sleep. She denies dyspnea or chest pain. In the past she tried AcipHex though this was too expensive. Regarding dysphagia occasionally she feels like foods such as bread and peanut butter will "hangup" in her chest. No odynophagia. Bowel habits have been regular without blood in her stool or melena. No family history of colon cancer. Last colonoscopy was in 2005 with one hyperplastic sigmoid polyp removed  Past Medical History  Diagnosis Date  . Personal history of colonic polyps 06/16/2003    hyperplastic  . VASOMOTOR RHINITIS   . IBS (irritable bowel syndrome)   . Arthritis   . Cataract   . GERD (gastroesophageal reflux disease)   . Blood transfusion without reported diagnosis 1987    after childbirth  . Endometriosis     Past Surgical History  Procedure Laterality  Date  . Breast biopsy Right 1990  . Mandible surgery  1985  . Tonsillectomy  1957  . Maxillary cyst excision Left 1968  . Laparoscopy  1986    endometriosis-minimal    Outpatient Prescriptions Prior to Visit  Medication Sig Dispense Refill  . Calcium Carbonate-Vitamin D (CALCIUM 600+D) 600-200 MG-UNIT TABS Take 1 tablet by mouth daily.        . fluticasone (FLONASE) 50 MCG/ACT nasal spray Place 2 sprays into the nose daily.  16 g  6  . Multiple Vitamin (MULTIVITAMIN) capsule Take 1 capsule by mouth daily.        . Famotidine (PEPCID PO) Take by mouth daily.       No facility-administered medications prior to visit.    Allergies  Allergen Reactions  . Omeprazole     diarrhea  . Sulfa Antibiotics Swelling    Family History  Problem Relation Age of Onset  . Lung cancer Mother     non smoker  . Cancer Father     bladder that spread to pancreas  . Heart attack Father   . Diabetes Father   . Colon polyps Father   . Colon cancer Neg Hx   . Prostate cancer Brother   . Diabetes Paternal Grandfather   . Heart disease Maternal Grandmother   . Heart disease Paternal Grandmother   . Hypertension Maternal Grandmother   . Hypertension Paternal Grandmother   . Osteoporosis Maternal Grandmother     History  Substance Use Topics  . Smoking  status: Never Smoker   . Smokeless tobacco: Never Used  . Alcohol Use: Yes     Comment: rarely    ROS: As per history of present illness, otherwise negative  BP 150/90  Pulse 78  Ht 5\' 1"  (1.549 m)  Wt 142 lb 12.8 oz (64.774 kg)  BMI 27.00 kg/m2  LMP 01/10/1998 Constitutional: Well-developed and well-nourished. No distress. HEENT: Normocephalic and atraumatic. Oropharynx is clear and moist. No oropharyngeal exudate. Conjunctivae are normal.  No scleral icterus. Neck: Neck supple. Trachea midline. Cardiovascular: Normal rate, regular rhythm and intact distal pulses. No M/R/G Pulmonary/chest: Effort normal and breath sounds normal. No  wheezing, rales or rhonchi. Abdominal: Soft, nontender, nondistended. Bowel sounds active throughout. There are no masses palpable. No hepatosplenomegaly. Extremities: no clubbing, cyanosis, or edema Lymphadenopathy: No cervical adenopathy noted. Neurological: Alert and oriented to person place and time. Skin: Skin is warm and dry. No rashes noted. Psychiatric: Normal mood and affect. Behavior is normal.  RELEVANT LABS AND IMAGING: CBC    Component Value Date/Time   WBC 7.2 05/31/2013 0850   RBC 4.24 05/31/2013 0850   HGB 13.1 05/31/2013 0850   HCT 38.8 05/31/2013 0850   PLT 232.0 05/31/2013 0850   MCV 91.6 05/31/2013 0850   MCHC 33.8 05/31/2013 0850   RDW 13.6 05/31/2013 0850   LYMPHSABS 2.4 05/31/2013 0850   MONOABS 0.5 05/31/2013 0850   EOSABS 0.1 05/31/2013 0850   BASOSABS 0.0 05/31/2013 0850    CMP     Component Value Date/Time   NA 141 05/31/2013 0850   K 4.1 05/31/2013 0850   CL 106 05/31/2013 0850   CO2 28 05/31/2013 0850   GLUCOSE 115* 05/31/2013 0850   BUN 20 05/31/2013 0850   CREATININE 0.7 05/31/2013 0850   CALCIUM 9.2 05/31/2013 0850   PROT 6.8 05/31/2013 0850   ALBUMIN 3.9 05/31/2013 0850   AST 16 05/31/2013 0850   ALT 20 05/31/2013 0850   ALKPHOS 64 05/31/2013 0850   BILITOT 0.6 05/31/2013 0850   GFRNONAA 79 02/12/2007 1046   GFRAA 95 02/12/2007 1046    ASSESSMENT/PLAN: 63 year old female with a past medical history of GERD and chronic cough who is seen to evaluate chronic cough and possible reflux disease and also discuss repeat screening colonoscopy.   1. GERD with chronic cough -- it does seem that cough is primarily GERD related. She describes some intermittent and very mild dysphagia. She has never had an upper endoscopy, and I have recommended this test. We discussed the risks and benefits and she is agreeable to proceed. She has benefited from once daily H2 blocker and so I will prescribe pantoprazole 40 mg daily, 30 minutes before breakfast, in an attempt to control acid  more completely and help cough. She can move this dose to 30 minutes before her last meal of the day if this is more effective for her. Continue GERD diet and avoid overeating. Further recommendations based on findings at endoscopy and response to PPI  2. CRC screening -- due for repeat screening colonoscopy now. No history of adenomatous polyp or family history of colon cancer. Colonoscopy will be performed on the same day as her upper endoscopy, and we discussed the risk and benefits after which she is agreeable to proceed.

## 2013-10-23 ENCOUNTER — Ambulatory Visit (AMBULATORY_SURGERY_CENTER): Payer: BC Managed Care – PPO | Admitting: Internal Medicine

## 2013-10-23 ENCOUNTER — Encounter: Payer: Self-pay | Admitting: Internal Medicine

## 2013-10-23 VITALS — BP 160/72 | HR 73 | Temp 97.5°F | Resp 20 | Ht 61.0 in | Wt 142.0 lb

## 2013-10-23 DIAGNOSIS — D124 Benign neoplasm of descending colon: Secondary | ICD-10-CM

## 2013-10-23 DIAGNOSIS — K219 Gastro-esophageal reflux disease without esophagitis: Secondary | ICD-10-CM

## 2013-10-23 DIAGNOSIS — D122 Benign neoplasm of ascending colon: Secondary | ICD-10-CM

## 2013-10-23 DIAGNOSIS — Z1211 Encounter for screening for malignant neoplasm of colon: Secondary | ICD-10-CM

## 2013-10-23 DIAGNOSIS — R053 Chronic cough: Secondary | ICD-10-CM

## 2013-10-23 DIAGNOSIS — R05 Cough: Secondary | ICD-10-CM

## 2013-10-23 MED ORDER — SODIUM CHLORIDE 0.9 % IV SOLN: 500.0000 mL | INTRAVENOUS | Status: DC

## 2013-10-23 NOTE — Progress Notes (Signed)
Called to room to assist during endoscopic procedure.  Patient ID and intended procedure confirmed with present staff. Received instructions for my participation in the procedure from the performing physician.  

## 2013-10-23 NOTE — Progress Notes (Signed)
Rosanne Sack RN contacted per Dr. Caffie Damme request to schedule ENT referral with Dr. Janace Hoard HC:[id number] cough.   Vaughan Basta will call her back tomorrow Pt. Given this informaton.

## 2013-10-23 NOTE — Progress Notes (Signed)
A/ox3, pleased with MAC, report to RN 

## 2013-10-23 NOTE — Op Note (Signed)
[place]  [address]. [place], [id number]   COLONOSCOPY PROCEDURE REPORT  PATIENT: Donya, Hitch  MR#: [id number] BIRTHDATE: [birth year] , 16  yrs. old GENDER: female ENDOSCOPIST: Jerene Bears, MD PROCEDURE DATE:  10/23/2013 PROCEDURE:   Colonoscopy with snare polypectomy and Submucosal injection, any substance First Screening Colonoscopy - Avg.  risk and is 50 yrs.  old or older - No.  Prior Negative Screening - Now for repeat screening. 10 or more years since last screening  History of Adenoma - Now for follow-up colonoscopy & has been > or = to 3 yrs.  N/A  Polyps Removed Today? Yes. ASA CLASS:   Class II INDICATIONS:average risk for colorectal cancer and last colonoscopy completed  years ago. MEDICATIONS: Monitored anesthesia care and Propofol 300 mg IV  DESCRIPTION OF PROCEDURE:   After the risks benefits and alternatives of the procedure were thoroughly explained, informed consent was obtained.  The digital rectal exam revealed no rectal mass.   The LB [id number]-H190 [id number]  endoscope was introduced through the anus and advanced to the cecum, which was identified by both the appendix and ileocecal valve. No adverse events experienced. The quality of the prep was good, using MoviPrep  The instrument was then slowly withdrawn as the colon was fully examined.      COLON FINDINGS: A semi-pedunculated, multi-lobulated, polyp ranging from 25 to 11mm in size was found in the ascending colon.  A 1:100,000 Epinephrine solution + saline Injection was given to lift the mucosal wall.  A polypectomy was performed in a piecemeal fashion using snare cautery.  The resection was complete, the polyp tissue was completely retrieved and sent to histology. The polypectomy base was examined in forward and retroflexed view and is located approximately 2 folds distal to the IC valve. No target sign was seen. A tattoo was applied on either side of the polypectomy base with 4 cc  of Niger ink.   Two sessile polyps ranging from 5 to 5mm in size were found in the ascending colon. Polypectomies were performed.  The resection was complete, the polyp tissue was completely retrieved and sent to histology. Three sessile polyps ranging from 4 to 53mm in size were found in the descending colon.  Polypectomies were performed using snare cautery (1) and with a cold snare (2).  The resection was complete, the polyp tissue was completely retrieved and sent to histology. Retroflexed views revealed no abnormalities. The time to cecum=5 minutes 17 seconds.  Withdrawal time=36 minutes 16 seconds.  The scope was withdrawn and the procedure completed.  COMPLICATIONS: There were no immediate complications.  ENDOSCOPIC IMPRESSION: 1.   Semi-pedunculated polyp ranging from 25 to 63mm in size was found in the ascending colon; 1:10,000 Epinephrine solution was given to lift the mucosal wall.; polypectomy was performed in a piecemeal fashion using snare cautery; a tattoo was applied 2.   Two sessile polyps ranging from 5 to 83mm in size were found in the ascending colon; polypectomies were performed 3.   Three sessile polyps ranging from 4 to 41mm in size were found in the descending colon; polypectomies were performed using snare cautery and with a cold snare  RECOMMENDATIONS: 1.  Hold Aspirin and all other NSAIDS for 3 weeks. 2.  Await pathology results 3.  Repeat colonoscopy in 3-6 months in hospital setting (given availability of APC) to ensure complete resection   eSigned:  Jerene Bears, MD 10/23/2013 4:32 PM   cc: The Patient and Doretha Sou  Burnice Logan, MD   PATIENT NAME:  Marijane, Trower MR#: [id number]

## 2013-10-23 NOTE — Patient Instructions (Addendum)
YOU HAD AN ENDOSCOPIC PROCEDURE TODAY AT THE [facility]: Refer to the procedure report that was given to you for any specific questions about what was found during the examination.  If the procedure report does not answer your questions, please call your gastroenterologist to clarify.  If you requested that your care partner not be given the details of your procedure findings, then the procedure report has been included in a sealed envelope for you to review at your convenience later.  YOU SHOULD EXPECT: Some feelings of bloating in the abdomen. Passage of more gas than usual.  Walking can help get rid of the air that was put into your GI tract during the procedure and reduce the bloating. If you had a lower endoscopy (such as a colonoscopy or flexible sigmoidoscopy) you may notice spotting of blood in your stool or on the toilet paper. If you underwent a bowel prep for your procedure, then you may not have a normal bowel movement for a few days.  DIET: Your first meal following the procedure should be a light meal and then it is ok to progress to your normal diet.  A half-sandwich or bowl of soup is an example of a good first meal.  Heavy or fried foods are harder to digest and may make you feel nauseous or bloated.  Likewise meals heavy in dairy and vegetables can cause extra gas to form and this can also increase the bloating.  Drink plenty of fluids but you should avoid alcoholic beverages for 24 hours.  ACTIVITY: Your care partner should take you home directly after the procedure.  You should plan to take it easy, moving slowly for the rest of the day.  You can resume normal activity the day after the procedure however you should NOT DRIVE or use heavy machinery for 24 hours (because of the sedation medicines used during the test).    SYMPTOMS TO REPORT IMMEDIATELY: A gastroenterologist can be reached at any hour.  During normal business hours, 8:30 AM to 5:00 PM Monday through Friday,  call [phone].  After hours and on weekends, please call the GI answering service at [phone] who will take a message and have the physician on call contact you.   Following lower endoscopy (colonoscopy or flexible sigmoidoscopy):  Excessive amounts of blood in the stool  Significant tenderness or worsening of abdominal pains  Swelling of the abdomen that is new, acute  Fever of 100F or higher  Following upper endoscopy (EGD)  Vomiting of blood or coffee ground material  New chest pain or pain under the shoulder blades  Painful or persistently difficult swallowing  New shortness of breath  Fever of 100F or higher  Black, tarry-looking stools  FOLLOW UP: If any biopsies were taken you will be contacted by phone or by letter within the next 1-3 weeks.  Call your gastroenterologist if you have not heard about the biopsies in 3 weeks.  Our staff will call the home number listed on your records the next business day following your procedure to check on you and address any questions or concerns that you may have at that time regarding the information given to you following your procedure. This is a courtesy call and so if there is no answer at the home number and we have not heard from you through the emergency physician on call, we will assume that you have returned to your regular daily activities without incident.  SIGNATURES/CONFIDENTIALITY: You and/or your care   partner have signed paperwork which will be entered into your electronic medical record.  These signatures attest to the fact that that the information above on your After Visit Summary has been reviewed and is understood.  Full responsibility of the confidentiality of this discharge information lies with you and/or your care-partner.  Normal upper endoscopy.  Polyp information given.  Hold aspirin and all other NSAIDS for 3 weeks.  Await pathology reports.  Repeat colonoscopy in 3-6 months to ensure complete  resection.  To be scheduled for consult with ENT with Dr. Janace Hoard.

## 2013-10-23 NOTE — Op Note (Signed)
[place]  [address]. [place], [address]   ENDOSCOPY PROCEDURE REPORT  PATIENT: Stephanie, Grimes  MR#: [id number] BIRTHDATE: Jul 25, 1950 , 63  yrs. old GENDER: female ENDOSCOPIST: Jerene Bears, MD REFERRED BY:  Bluford Kaufmann, M.D. PROCEDURE DATE:  10/23/2013 PROCEDURE:  EGD, diagnostic ASA CLASS:     Class II INDICATIONS:  heartburn and history of GERD.  chronic cough. MEDICATIONS: Propofol 150 mg IV and Monitored anesthesia care TOPICAL ANESTHETIC: none  DESCRIPTION OF PROCEDURE: After the risks benefits and alternatives of the procedure were thoroughly explained, informed consent was obtained.  The LB [id number] endoscope was introduced through the mouth and advanced to the second portion of the duodenum , Without limitations.  The instrument was slowly withdrawn as the mucosa was fully examined.      EXAM: The esophagus and gastroesophageal junction were completely normal in appearance.  The stomach was entered and closely examined.The antrum, angularis, and lesser curvature were well visualized, including a retroflexed view of the cardia and fundus. The stomach wall was normally distensible.  The scope passed easily through the pylorus into the duodenum.  Retroflexed views revealed no abnormalities.     The scope was then withdrawn from the patient and the procedure completed.  COMPLICATIONS: There were no immediate complications.  ENDOSCOPIC IMPRESSION: Normal appearing esophagus and GE junction, the stomach was well visualized and normal in appearance, normal appearing duodenum  RECOMMENDATIONS: Continue daily PPI.  If cough continues would recommend referral to pulmonology.   eSigned:  Jerene Bears, MD 10/23/2013 4:35 PM    CC:The Patient and Marletta Lor, MD

## 2013-10-24 ENCOUNTER — Telehealth: Payer: Self-pay

## 2013-10-24 ENCOUNTER — Telehealth: Payer: Self-pay | Admitting: *Deleted

## 2013-10-24 NOTE — Telephone Encounter (Signed)
Per Dr. Hilarie Fredrickson pt needs to be seen by Dr. Janace Hoard, ENT, for chronic cough. Pt scheduled to see Dr. Janace Hoard 10/29/13 Tuesday at 3:10pm. Pt to arrive there 80min early. Spoke with pt and she is aware of appt.

## 2013-10-24 NOTE — Telephone Encounter (Signed)
  Follow up Call-  Call back number 10/23/2013  Post procedure Call Back phone  # [phone]  Permission to leave phone message Yes     Patient questions:  Do you have a fever, pain , or abdominal swelling? No. Pain Score  0 *  Have you tolerated food without any problems? Yes.    Have you been able to return to your normal activities? Yes.    Do you have any questions about your discharge instructions: Diet   No. Medications  No. Follow up visit  No.  Do you have questions or concerns about your Care? No.  Actions: * If pain score is 4 or above: No action needed, pain <4.

## 2013-10-29 ENCOUNTER — Telehealth: Payer: Self-pay | Admitting: Internal Medicine

## 2013-10-29 ENCOUNTER — Encounter: Payer: Self-pay | Admitting: Internal Medicine

## 2013-10-29 NOTE — Telephone Encounter (Signed)
Discuss pathology results with patient, polyps were tubular adenoma without evidence for malignancy She was obviously relieved My recommendation for repeat colonoscopy in 3-6 months in the hospital setting for availability of APC stands. She is aware of this recommendation

## 2013-11-11 ENCOUNTER — Encounter: Payer: Self-pay | Admitting: Internal Medicine

## 2013-12-02 ENCOUNTER — Telehealth: Payer: Self-pay | Admitting: *Deleted

## 2013-12-02 NOTE — Telephone Encounter (Signed)
I have gotten prior authorization for patient's pantoprazole from [employer] from 11/02/13-12/02/14. Case ID [id number].

## 2014-01-15 ENCOUNTER — Encounter: Payer: Self-pay | Admitting: Internal Medicine

## 2014-06-03 ENCOUNTER — Telehealth: Payer: Self-pay | Admitting: Internal Medicine

## 2014-06-03 ENCOUNTER — Other Ambulatory Visit: Payer: Self-pay

## 2014-06-03 DIAGNOSIS — Z86010 Personal history of colonic polyps: Secondary | ICD-10-CM

## 2014-06-03 NOTE — Telephone Encounter (Signed)
Left message for pt to call back. Pt scheduled for colon with mac at [facility] 06/24/14@10 :30am. Pt to arrive there at 9am.

## 2014-06-03 NOTE — Telephone Encounter (Signed)
Pt scheduled for previsit for colon 06/13/14@9am . Pt aware of appts.

## 2014-06-04 ENCOUNTER — Telehealth: Payer: Self-pay | Admitting: Family Medicine

## 2014-06-04 ENCOUNTER — Other Ambulatory Visit: Payer: Self-pay | Admitting: Family Medicine

## 2014-06-04 DIAGNOSIS — Z1239 Encounter for other screening for malignant neoplasm of breast: Secondary | ICD-10-CM

## 2014-06-04 NOTE — Telephone Encounter (Signed)
Spoke to the pt.  She has not had her yearly mammogram.  Order placed in the system and the pt notified that she will be contacted.

## 2014-06-13 ENCOUNTER — Encounter (HOSPITAL_COMMUNITY): Payer: Self-pay | Admitting: *Deleted

## 2014-06-13 ENCOUNTER — Ambulatory Visit (AMBULATORY_SURGERY_CENTER): Payer: Self-pay

## 2014-06-13 VITALS — Ht 62.0 in | Wt 145.0 lb

## 2014-06-13 DIAGNOSIS — Z860100 Personal history of colon polyps, unspecified: Secondary | ICD-10-CM

## 2014-06-13 MED ORDER — SUPREP BOWEL PREP KIT 17.5-3.13-1.6 GM/177ML PO SOLN
1.0000 | Freq: Once | ORAL | Status: DC
Start: 2014-06-13 — End: 2014-10-29

## 2014-06-13 NOTE — Progress Notes (Signed)
No allergies to eggs or soy No diet/weight loss meds No home oxygen No past problems with anesthesia EXCEPT PONV  Has email  Refused Emmi instructions

## 2014-06-23 ENCOUNTER — Encounter: Payer: Self-pay | Admitting: Gastroenterology

## 2014-06-24 ENCOUNTER — Encounter (HOSPITAL_COMMUNITY): Payer: Self-pay

## 2014-06-24 ENCOUNTER — Ambulatory Visit (HOSPITAL_COMMUNITY): Payer: BC Managed Care – PPO | Admitting: Certified Registered Nurse Anesthetist

## 2014-06-24 ENCOUNTER — Ambulatory Visit (HOSPITAL_COMMUNITY)
Admission: RE | Admit: 2014-06-24 | Discharge: 2014-06-24 | Disposition: A | Discharge status: Home / Self Care | Payer: BC Managed Care – PPO | Source: Ambulatory Visit | Attending: Internal Medicine | Admitting: Internal Medicine

## 2014-06-24 ENCOUNTER — Encounter (HOSPITAL_COMMUNITY)
Admission: RE | Disposition: A | Discharge status: Home / Self Care | Payer: Self-pay | Source: Ambulatory Visit | Attending: Internal Medicine

## 2014-06-24 DIAGNOSIS — K589 Irritable bowel syndrome without diarrhea: Secondary | ICD-10-CM | POA: Diagnosis not present

## 2014-06-24 DIAGNOSIS — M19049 Primary osteoarthritis, unspecified hand: Secondary | ICD-10-CM | POA: Insufficient documentation

## 2014-06-24 DIAGNOSIS — D123 Benign neoplasm of transverse colon: Secondary | ICD-10-CM | POA: Diagnosis not present

## 2014-06-24 DIAGNOSIS — K635 Polyp of colon: Secondary | ICD-10-CM | POA: Diagnosis not present

## 2014-06-24 DIAGNOSIS — Z86010 Personal history of colonic polyps: Secondary | ICD-10-CM | POA: Insufficient documentation

## 2014-06-24 DIAGNOSIS — Z882 Allergy status to sulfonamides status: Secondary | ICD-10-CM | POA: Insufficient documentation

## 2014-06-24 DIAGNOSIS — D122 Benign neoplasm of ascending colon: Secondary | ICD-10-CM | POA: Insufficient documentation

## 2014-06-24 DIAGNOSIS — K219 Gastro-esophageal reflux disease without esophagitis: Secondary | ICD-10-CM | POA: Insufficient documentation

## 2014-06-24 DIAGNOSIS — Z888 Allergy status to other drugs, medicaments and biological substances status: Secondary | ICD-10-CM | POA: Diagnosis not present

## 2014-06-24 HISTORY — PX: HOT HEMOSTASIS: SHX5433

## 2014-06-24 HISTORY — DX: Other specified postprocedural states: R11.2

## 2014-06-24 HISTORY — DX: Other specified postprocedural states: Z98.890

## 2014-06-24 HISTORY — PX: COLONOSCOPY: SHX5424

## 2014-06-24 SURGERY — COLONOSCOPY
Anesthesia: Monitor Anesthesia Care

## 2014-06-24 MED ORDER — LIDOCAINE HCL (CARDIAC) 20 MG/ML IV SOLN
INTRAVENOUS | Status: AC
  Filled 2014-06-24: qty 5

## 2014-06-24 MED ORDER — PROPOFOL INFUSION 10 MG/ML OPTIME
INTRAVENOUS | Status: DC | PRN
  Administered 2014-06-24: 140 ug/kg/min via INTRAVENOUS

## 2014-06-24 MED ORDER — PROPOFOL 10 MG/ML IV BOLUS
INTRAVENOUS | Status: AC
  Filled 2014-06-24: qty 20

## 2014-06-24 MED ORDER — ONDANSETRON HCL 4 MG/2ML IJ SOLN
INTRAMUSCULAR | Status: AC
  Filled 2014-06-24: qty 2

## 2014-06-24 MED ORDER — LIDOCAINE HCL (CARDIAC) 20 MG/ML IV SOLN
INTRAVENOUS | Status: DC | PRN
  Administered 2014-06-24: 100 mg via INTRAVENOUS

## 2014-06-24 MED ORDER — SODIUM CHLORIDE 0.9 % IV SOLN: INTRAVENOUS | Status: DC

## 2014-06-24 MED ORDER — ONDANSETRON HCL 4 MG/2ML IJ SOLN
INTRAMUSCULAR | Status: DC | PRN
  Administered 2014-06-24: 4 mg via INTRAVENOUS

## 2014-06-24 MED ORDER — LACTATED RINGERS IV SOLN
INTRAVENOUS | Status: DC
  Administered 2014-06-24: 1000 mL via INTRAVENOUS

## 2014-06-24 NOTE — Op Note (Signed)
[facility] [place], [address]   COLONOSCOPY PROCEDURE REPORT  PATIENT: Stephanie Grimes, Stephanie Grimes  MR#: [id number] BIRTHDATE: 1950/04/23 , 80  yrs. old GENDER: female ENDOSCOPIST: Jerene Bears, MD PROCEDURE DATE:  06/24/2014 PROCEDURE:   Colonoscopy, surveillance , Colonoscopy with cold biopsy polypectomy, and Colonoscopy with snare polypectomy First Screening Colonoscopy - Avg.  risk and is 50 yrs.  old or older - No.  Prior Negative Screening - Now for repeat screening. N/A  History of Adenoma - Now for follow-up colonoscopy & has been > or = to 3 yrs.  No.  It has been less than 3 yrs since last colonoscopy.  Medical reason.  Polyps removed today? Yes ASA CLASS:   Class II INDICATIONS:surveillance of previously piecemeal resection of semi-pedunculated adenomatous colon polyp in the ascending colon, last colonoscopy 10/23/2013. MEDICATIONS: Monitored anesthesia care and Per Anesthesia  DESCRIPTION OF PROCEDURE:   After the risks benefits and alternatives of the procedure were thoroughly explained, informed consent was obtained.  The digital rectal exam revealed no rectal mass.   The Pentax Ped Colon [id number]  endoscope was introduced through the anus and advanced to the terminal ileum which was intubated for a short distance. No adverse events experienced. The quality of the prep was good.  (Suprep was used)  The instrument was then slowly withdrawn as the colon was fully examined. Estimated blood loss is zero unless otherwise noted in this procedure report.   COLON FINDINGS: The examined terminal ileum appeared to be normal. Mucosal tattoo in the ascending colon at site of prior polypectomy. The site was carefully examined in forward and retroflexed view and no residual adenomatous tissue seen.   Four sessile polyps ranging from 2 to 56mm in size were found in the ascending colon (3) and at the ileocecal valve (1) (none of these diminutive polyps was near  mucosal tattoo).  Polypectomies were performed with a cold snare (3) and with cold forceps (1).  The resection was complete, the polyp tissue was completely retrieved and sent to histology. Two sessile polyps ranging between 3-26mm in size were found in the transverse colon.  Polypectomies were performed with a cold snare. The resection was complete, the polyp tissue was completely retrieved and sent to histology.   The examination was otherwise normal.  Retroflexed views revealed no abnormalities. The time to cecum = 4.8 Withdrawal time = 17.2   The scope was withdrawn and the procedure completed. COMPLICATIONS: There were no immediate complications.  ENDOSCOPIC IMPRESSION: 1.   The examined terminal ileum appeared to be normal 2.   Mucosal tattoo in the ascending colon at site of prior polypectomy.  The site was carefully examined in forward and retroflexed view and no residual adenomatous tissue seen 3.   Four sessile polyps ranging from 2 to 72mm in size were found in the ascending colon and at the ileocecal valve; polypectomies were performed with a cold snare and with cold forceps 4.   Two sessile polyps ranging between 3-51mm in size were found in the transverse colon; polypectomies were performed with a cold snare 5.   The examination was otherwise normal  RECOMMENDATIONS: 1.  Await pathology results 2.  Timing of repeat colonoscopy will be determined by pathology findings. 3.  You will receive a letter within 1-2 weeks with the results of your biopsy as well as final recommendations.  Please call my office if you have not received a letter after 3 weeks.  eSigned:  Lajuan Lines  Pyrtle, MD 06/24/2014 10:10 AM   cc: Marletta Lor, MD and The Patient   PATIENT NAME:  Stephanie Grimes, Stephanie Grimes MR#: [id number]

## 2014-06-24 NOTE — Anesthesia Preprocedure Evaluation (Signed)
Anesthesia Evaluation  Patient identified by MRN, date of birth, ID band Patient awake    Reviewed: Allergy & Precautions, H&P , NPO status , Patient's Chart, lab work & pertinent test results  History of Anesthesia Complications (+) PONV  Airway Mallampati: II  TM Distance: >3 FB Neck ROM: full    Dental no notable dental hx. (+) Teeth Intact, Dental Advisory Given   Pulmonary neg pulmonary ROS,  breath sounds clear to auscultation  Pulmonary exam normal       Cardiovascular Exercise Tolerance: Good negative cardio ROS Normal cardiovascular examRhythm:regular Rate:Normal     Neuro/Psych Raynaud's syndrome negative neurological ROS  negative psych ROS   GI/Hepatic negative GI ROS, Neg liver ROS, GERD-  Medicated and Controlled,  Endo/Other  negative endocrine ROSImpaired glucose tolerance  Renal/GU negative Renal ROS  negative genitourinary   Musculoskeletal   Abdominal   Peds  Hematology negative hematology ROS (+)   Anesthesia Other Findings   Reproductive/Obstetrics negative OB ROS                             Anesthesia Physical Anesthesia Plan  ASA: II  Anesthesia Plan: MAC   Post-op Pain Management:    Induction:   Airway Management Planned:   Additional Equipment:   Intra-op Plan:   Post-operative Plan:   Informed Consent: I have reviewed the patients History and Physical, chart, labs and discussed the procedure including the risks, benefits and alternatives for the proposed anesthesia with the patient or authorized representative who has indicated his/her understanding and acceptance.   Dental Advisory Given  Plan Discussed with: CRNA and Surgeon  Anesthesia Plan Comments:         Anesthesia Quick Evaluation

## 2014-06-24 NOTE — Transfer of Care (Signed)
Immediate Anesthesia Transfer of Care Note  Patient: Stephanie Grimes  Procedure(s) Performed: Procedure(s): COLONOSCOPY (N/A) HOT HEMOSTASIS (ARGON PLASMA COAGULATION/BICAP) (N/A)  Patient Location: endoscopy  Anesthesia Type:MAC  Level of Consciousness:  awake, patient cooperative and responds to stimulation  Airway & Oxygen Therapy:Patient Spontanous Breathing and Patient connected to face mask oxgen  Post-op Assessment:  Report given to endo RN and Post -op Vital signs reviewed and stable  Post vital signs:  Reviewed and stable  Last Vitals:  Filed Vitals:   06/24/14 1011  BP: 134/73  Pulse:   Temp:   Resp: 21    Complications: No apparent anesthesia complications

## 2014-06-24 NOTE — H&P (Signed)
HPI: Stephanie Grimes is a 64 year old female with a history of adenomatous colon polyps, IBS, GERD, remote endometriosis who presents for surveillance colonoscopy. She underwent screening colonoscopy on 10/23/2013. This revealed a 3 cm semi-pedunculated polyp in the ascending colon. EMR was performed in piecemeal fashion and a tattoo was applied at the polypectomy face. 5 other polyps were removed from the right and left colon at that colonoscopy. Given piecemeal resection the decision was made to repeat the colonoscopy in 3-6 months to ensure complete resection. She denies complaint today including abdominal pain. No nausea or vomiting. No blood in her stool or melena. Bowel habits been regular for her. She denies chest pain and dyspnea.  Past Medical History  Diagnosis Date  . Personal history of colonic polyps 06/16/2003    hyperplastic  . VASOMOTOR RHINITIS   . IBS (irritable bowel syndrome)   . Cataract   . GERD (gastroesophageal reflux disease)   . Blood transfusion without reported diagnosis 1987    after childbirth  . Endometriosis   . PONV (postoperative nausea and vomiting)   . Arthritis     fingers    Past Surgical History  Procedure Laterality Date  . Breast biopsy Right 1990  . Mandible surgery  1985  . Tonsillectomy  1957  . Maxillary cyst excision Left 1968  . Laparoscopy  1986    endometriosis-minimal  . Colonoscopy w/ polypectomy       (Not in an outpatient encounter)  Allergies  Allergen Reactions  . Omeprazole     diarrhea  . Sulfa Antibiotics Swelling    Family History  Problem Relation Age of Onset  . Lung cancer Mother     non smoker  . Cancer Father     bladder that spread to pancreas  . Heart attack Father   . Diabetes Father   . Colon polyps Father   . Colon cancer Neg Hx   . Prostate cancer Brother   . Diabetes Paternal Grandfather   . Heart disease Maternal Grandmother   . Heart disease Paternal Grandmother   . Hypertension Maternal  Grandmother   . Hypertension Paternal Grandmother   . Osteoporosis Maternal Grandmother     History  Substance Use Topics  . Smoking status: Never Smoker   . Smokeless tobacco: Never Used  . Alcohol Use: Yes     Comment: rarely    ROS: As per history of present illness, otherwise negative  PE BP 162/76 mmHg  Pulse 70  Temp(Src) 97.8 F (36.6 C) (Oral)  Resp 22  Ht 5\' 2"  (1.575 m)  Wt 145 lb (65.772 kg)  BMI 26.51 kg/m2  SpO2 94%  LMP 01/10/1998 Gen: awake, alert, NAD HEENT: anicteric, op clear CV: RRR, no mrg Pulm: CTA b/l Abd: soft, NT/ND, +BS throughout Ext: no c/c/e Neuro: nonfocal    RELEVANT LABS AND IMAGING: CBC    Component Value Date/Time   WBC 7.2 05/31/2013 0850   RBC 4.24 05/31/2013 0850   HGB 13.1 05/31/2013 0850   HCT 38.8 05/31/2013 0850   PLT 232.0 05/31/2013 0850   MCV 91.6 05/31/2013 0850   MCHC 33.8 05/31/2013 0850   RDW 13.6 05/31/2013 0850   LYMPHSABS 2.4 05/31/2013 0850   MONOABS 0.5 05/31/2013 0850   EOSABS 0.1 05/31/2013 0850   BASOSABS 0.0 05/31/2013 0850    CMP     Component Value Date/Time   NA 141 05/31/2013 0850   K 4.1 05/31/2013 0850   CL 106 05/31/2013 0850   CO2  28 05/31/2013 0850   GLUCOSE 115* 05/31/2013 0850   BUN 20 05/31/2013 0850   CREATININE 0.7 05/31/2013 0850   CALCIUM 9.2 05/31/2013 0850   PROT 6.8 05/31/2013 0850   ALBUMIN 3.9 05/31/2013 0850   AST 16 05/31/2013 0850   ALT 20 05/31/2013 0850   ALKPHOS 64 05/31/2013 0850   BILITOT 0.6 05/31/2013 0850   GFRNONAA 79 02/12/2007 1046   GFRAA 95 02/12/2007 1046    ASSESSMENT/PLAN: 64 year old female with a history of adenomatous colon polyps, IBS, GERD, remote endometriosis who presents for surveillance colonoscopy.  1. Surveillance colonoscopy/history of adenomatous colon polyps -- patient presents for surveillance colonoscopy at short interval given piecemeal resection of large ascending colon polyp. The nature of the procedure, as well as the risks,  benefits, and alternatives were carefully and thoroughly reviewed with the patient. Ample time for discussion and questions allowed. The patient understood, was satisfied, and agreed to proceed.

## 2014-06-24 NOTE — Anesthesia Postprocedure Evaluation (Signed)
  Anesthesia Post-op Note  Patient: Stephanie Grimes  Procedure(s) Performed: Procedure(s) (LRB): COLONOSCOPY (N/A) HOT HEMOSTASIS (ARGON PLASMA COAGULATION/BICAP) (N/A)  Patient Location: PACU  Anesthesia Type: MAC  Level of Consciousness: awake and alert   Airway and Oxygen Therapy: Patient Spontanous Breathing  Post-op Pain: mild  Post-op Assessment: Post-op Vital signs reviewed, Patient's Cardiovascular Status Stable, Respiratory Function Stable, Patent Airway and No signs of Nausea or vomiting  Last Vitals:  Filed Vitals:   06/24/14 1033  BP: 168/72  Pulse: 64  Temp:   Resp: 18    Post-op Vital Signs: stable   Complications: No apparent anesthesia complications

## 2014-06-24 NOTE — Discharge Instructions (Signed)

## 2014-06-25 ENCOUNTER — Encounter (HOSPITAL_COMMUNITY): Payer: Self-pay | Admitting: Internal Medicine

## 2014-06-26 ENCOUNTER — Encounter: Payer: Self-pay | Admitting: Internal Medicine

## 2014-07-28 ENCOUNTER — Ambulatory Visit: Payer: BC Managed Care – PPO | Admitting: Gynecology

## 2014-07-29 ENCOUNTER — Encounter: Payer: Self-pay | Admitting: Nurse Practitioner

## 2014-07-29 ENCOUNTER — Ambulatory Visit (INDEPENDENT_AMBULATORY_CARE_PROVIDER_SITE_OTHER): Payer: BC Managed Care – PPO | Admitting: Nurse Practitioner

## 2014-07-29 VITALS — BP 130/88 | HR 78 | Ht 62.0 in | Wt 143.8 lb

## 2014-07-29 DIAGNOSIS — Z01419 Encounter for gynecological examination (general) (routine) without abnormal findings: Secondary | ICD-10-CM

## 2014-07-29 DIAGNOSIS — N3941 Urge incontinence: Secondary | ICD-10-CM

## 2014-07-29 DIAGNOSIS — Z860100 Personal history of colon polyps, unspecified: Secondary | ICD-10-CM

## 2014-07-29 NOTE — Progress Notes (Signed)
64 y.o. G42P1001 Married  Caucasian Fe here for annual exam.  Having no new problems.  Cough is better - seeing ENT and may have damage to the nerve.  May have "damaged the nerves".  Some stress incontinence and wears a pad prn.  Patient's last menstrual period was 01/10/1998.          Sexually active: Yes.    The current method of family planning is post menopausal status.    Exercising: Yes.    walk every day 7x / wk Smoker:  no  Health Maintenance: Pap:  07/24/13 wnl neg HR HPV MMG:  03/31/10 bi-rads 2: benign findings; February 2015 normal per pt. Colonoscopy:  06/24/14 benign f/u in 2019 BMD:   Never had one TDaP:  06/07/13 Shingles: not yet Prevnar 13: not yet Labs: PCP ; Urine: PCP   reports that she has never smoked. She has never used smokeless tobacco. She reports that she drinks alcohol. She reports that she does not use illicit drugs.  Past Medical History  Diagnosis Date  . Personal history of colonic polyps 06/16/2003    hyperplastic  . VASOMOTOR RHINITIS   . IBS (irritable bowel syndrome)   . Cataract   . GERD (gastroesophageal reflux disease)   . Blood transfusion without reported diagnosis 1987    after childbirth  . Endometriosis   . PONV (postoperative nausea and vomiting)   . Arthritis     fingers    Past Surgical History  Procedure Laterality Date  . Breast biopsy Right 1990  . Mandible surgery  1985  . Tonsillectomy  1957  . Maxillary cyst excision Left 1968  . Laparoscopy  1986    endometriosis-minimal  . Colonoscopy w/ polypectomy    . Colonoscopy N/A 06/24/2014    Procedure: COLONOSCOPY;  Surgeon: Jerene Bears, MD;  Location: [facility];  Service: Gastroenterology;  Laterality: N/A;  . Hot hemostasis N/A 06/24/2014    Procedure: HOT HEMOSTASIS (ARGON PLASMA COAGULATION/BICAP);  Surgeon: Jerene Bears, MD;  Location: [facility];  Service: Gastroenterology;  Laterality: N/A;    Current Outpatient Prescriptions  Medication Sig Dispense Refill  .  Calcium Carbonate-Vitamin D (CALCIUM 600+D) 600-200 MG-UNIT TABS Take 1 tablet by mouth daily.      . fluticasone (FLONASE) 50 MCG/ACT nasal spray Place 2 sprays into the nose daily. 16 g 6  . ibuprofen (ADVIL,MOTRIN) 200 MG tablet Take 200 mg by mouth every 6 (six) hours as needed for mild pain.    . Multiple Vitamin (MULTIVITAMIN) capsule Take 1 capsule by mouth daily.      . pantoprazole (PROTONIX) 40 MG tablet TK 1 T PO BID 30 MIN B BRE AND DINNER  5  . SUPREP BOWEL PREP SOLN Take 1 kit by mouth once. (Patient not taking: Reported on 07/29/2014) 354 mL 0   No current facility-administered medications for this visit.    Family History  Problem Relation Age of Onset  . Lung cancer Mother 11    non smoker  . Bladder Cancer Father 8    bladder that spread to pancreas  . Heart attack Father   . Diabetes Father   . Colon polyps Father   . Colon cancer Neg Hx   . Prostate cancer Brother   . Diabetes Paternal Grandfather   . Heart disease Maternal Grandmother   . Hypertension Maternal Grandmother   . Osteoporosis Maternal Grandmother   . Heart disease Paternal Grandmother   . Hypertension Paternal Grandmother  ROS:  Pertinent items are noted in HPI.  Otherwise, a comprehensive ROS was negative.  Exam:   BP 130/88 mmHg  Pulse 78  Ht 5' 2"  (1.575 m)  Wt 143 lb 12.8 oz (65.227 kg)  BMI 26.29 kg/m2  LMP 01/10/1998 Height: 5' 2"  (157.5 cm) Ht Readings from Last 3 Encounters:  07/29/14 5' 2"  (1.575 m)  06/24/14 5' 2"  (1.575 m)  06/13/14 5' 2"  (1.575 m)    General appearance: alert, cooperative and appears stated age Head: Normocephalic, without obvious abnormality, atraumatic Neck: no adenopathy, supple, symmetrical, trachea midline and thyroid normal to inspection and palpation Lungs: clear to auscultation bilaterally Breasts: normal appearance, no masses or tenderness Heart: regular rate and rhythm Abdomen: soft, non-tender; no masses,  no organomegaly Extremities:  extremities normal, atraumatic, no cyanosis or edema Skin: Skin color, texture, turgor normal. No rashes or lesions Lymph nodes: Cervical, supraclavicular, and axillary nodes normal. No abnormal inguinal nodes palpated Neurologic: Grossly normal   Pelvic: External genitalia:  no lesions              Urethra:  normal appearing urethra with no masses, tenderness or lesions              Bartholin's and Skene's: normal                 Vagina: normal appearing vagina with normal color and discharge, no lesions, grade I cystocele              Cervix: anteverted              Pap taken: No. Bimanual Exam:  Uterus:  normal size, contour, position, consistency, mobility, non-tender              Adnexa: no mass, fullness, tenderness               Rectovaginal: Confirms               Anus:  normal sphincter tone, no lesions  Chaperone present: No  A:  Well Woman with normal exam  Postmenopausal no HRT  Chronic cough   Stress incontinence - controlled at times  History of colonic polyps - recheck every 3 years   P:   Reviewed health and wellness pertinent to exam  Pap smear as above  Mammogram is past due and will schedule  -note for BMD faxed to [facility] on breast self exam, mammography screening, adequate intake of calcium and vitamin D, diet and exercise return annually or prn  An After Visit Summary was printed and given to the patient.

## 2014-07-29 NOTE — Patient Instructions (Addendum)

## 2014-07-30 NOTE — Progress Notes (Signed)
Encounter reviewed by Dr. Brook Amundson C. Silva.  

## 2014-10-07 ENCOUNTER — Telehealth: Payer: Self-pay

## 2014-10-07 NOTE — Telephone Encounter (Signed)
Spoke with patient. Advised BMD from 09/17/2014 shows Osteoporosis per Melvia Heaps CNM paper result note. Patient will need consult with Dr.Silva. Patient is agreeable. Appointment scheduled for 10/29/2014 at 9 am with Dr.Silva. Agreeable to date and time.  Routing to provider for final review. Patient agreeable to disposition. Will close encounter.

## 2014-10-29 ENCOUNTER — Ambulatory Visit (INDEPENDENT_AMBULATORY_CARE_PROVIDER_SITE_OTHER): Payer: BC Managed Care – PPO | Admitting: Obstetrics and Gynecology

## 2014-10-29 ENCOUNTER — Encounter: Payer: Self-pay | Admitting: Obstetrics and Gynecology

## 2014-10-29 VITALS — BP 158/80 | HR 70 | Ht 62.0 in | Wt 144.8 lb

## 2014-10-29 DIAGNOSIS — M810 Age-related osteoporosis without current pathological fracture: Secondary | ICD-10-CM | POA: Diagnosis not present

## 2014-10-29 MED ORDER — RALOXIFENE HCL 60 MG PO TABS: 60.0000 mg | ORAL_TABLET | Freq: Every day | ORAL | Status: DC

## 2014-10-29 NOTE — Progress Notes (Signed)
Patient ID: Stephanie Grimes, female   DOB: 05/02/1950, 64 y.o.   MRN: [id number] GYNECOLOGY  VISIT   HPI: 64 y.o.   Married  Caucasian  female   G1P1001 with Patient's last menstrual period was 01/10/1998.   here for consult to discuss bone density results.   Bone density showing T score of left hip -2.5, right hip -2.4, spine 0.2.  Menopause age 64 or 7.  No HRT.  Taking calcium irregularly due to "upset stomach." Calcium in 600 mg and vit D 200 mg.   Has reflux and globus sensation.  Has had a normal evaluation with ENT. Has a chronic cough for 20 years.  Has allergies.  Treating with Protonix.   Has IBS. This is diarrhea a couple of times per month due to dietary choices.  No prior fracture.   Nonsmoker.  1 glass of wine couple of times per week.   States her grandmother has osteoporosis.  FH of fatal stroke in grandmother.   She had uncontrolled HTN.   GYNECOLOGIC HISTORY: Patient's last menstrual period was 01/10/1998. Contraception:Postmenopausal Menopausal hormone therapy: None Last mammogram: 09-17-14 Density Cat.B/Neg/BiRads 1:[facility]. Last pap smear:  07-24-13 Neg:Neg HR HPV        OB History    Gravida Para Term Preterm AB TAB SAB Ectopic Multiple Living   1 1 1  0 0 0 0 0 1 1         Patient Active Problem List   Diagnosis Date Noted  . History of colonic polyps   . Benign neoplasm of ascending colon   . Benign neoplasm of transverse colon   . Impaired glucose tolerance 06/07/2013  . Esophageal reflux 09/24/2010  . Globus sensation 09/24/2010  . Raynauds phenomenon 09/24/2010  . VASOMOTOR RHINITIS 02/12/2007  . COLONIC POLYPS, HX OF 02/12/2007    Past Medical History  Diagnosis Date  . Personal history of colonic polyps 06/16/2003    hyperplastic  . VASOMOTOR RHINITIS   . IBS (irritable bowel syndrome)   . Cataract   . GERD (gastroesophageal reflux disease)   . Blood transfusion without reported diagnosis 1987    after childbirth  . Endometriosis   .  PONV (postoperative nausea and vomiting)   . Arthritis     fingers    Past Surgical History  Procedure Laterality Date  . Breast biopsy Right 1990  . Mandible surgery  1985  . Tonsillectomy  1957  . Maxillary cyst excision Left 1968  . Laparoscopy  1986    endometriosis-minimal  . Colonoscopy w/ polypectomy    . Colonoscopy N/A 06/24/2014    Procedure: COLONOSCOPY;  Surgeon: Jerene Bears, MD;  Location: [facility];  Service: Gastroenterology;  Laterality: N/A;  . Hot hemostasis N/A 06/24/2014    Procedure: HOT HEMOSTASIS (ARGON PLASMA COAGULATION/BICAP);  Surgeon: Jerene Bears, MD;  Location: [facility];  Service: Gastroenterology;  Laterality: N/A;    Current Outpatient Prescriptions  Medication Sig Dispense Refill  . Calcium Carbonate-Vitamin D (CALCIUM 600+D) 600-200 MG-UNIT TABS Take 1 tablet by mouth daily.      . fluticasone (FLONASE) 50 MCG/ACT nasal spray Place 2 sprays into the nose daily. 16 g 6  . ibuprofen (ADVIL,MOTRIN) 200 MG tablet Take 200 mg by mouth every 6 (six) hours as needed for mild pain.    . Multiple Vitamin (MULTIVITAMIN) capsule Take 1 capsule by mouth daily.      . pantoprazole (PROTONIX) 40 MG tablet TK 1 T PO BID 30 MIN B BRE  AND DINNER  5   No current facility-administered medications for this visit.     ALLERGIES: Omeprazole and Sulfa antibiotics  Family History  Problem Relation Age of Onset  . Lung cancer Mother 56    non smoker  . Bladder Cancer Father 70    bladder that spread to pancreas  . Heart attack Father   . Diabetes Father   . Colon polyps Father   . Colon cancer Neg Hx   . Prostate cancer Brother   . Diabetes Paternal Grandfather   . Heart disease Maternal Grandmother   . Hypertension Maternal Grandmother   . Osteoporosis Maternal Grandmother   . Heart disease Paternal Grandmother   . Hypertension Paternal Grandmother     Social History   Social History  . Marital Status: Married    Spouse Name: N/A  . Number of  Children: 1  . Years of Education: N/A   Occupational History  . retired Education officer, museum    Social History Main Topics  . Smoking status: Never Smoker   . Smokeless tobacco: Never Used  . Alcohol Use: 0.0 oz/week    0 Standard drinks or equivalent per week     Comment: rarely  . Drug Use: No  . Sexual Activity:    Partners: Male    Birth Control/ Protection: Post-menopausal   Other Topics Concern  . Not on file   Social History Narrative    ROS:  Pertinent items are noted in HPI.  PHYSICAL EXAMINATION:    BP 158/80 mmHg  Pulse 70  Ht 5\' 2"  (1.575 m)  Wt 144 lb 12.8 oz (65.681 kg)  BMI 26.48 kg/m2  LMP 01/10/1998    General appearance: alert, cooperative and appears stated age   ASSESSMENT  Osteoporosis.  Significant reflux.   PLAN  Counseled regarding osteoporosis - diagnosis, risk factors, treatment options, fall risk reduction.  Discussed options for bisphosphonates, Evista, calcitonin, Prolia, Reclast.  Will check PTH and vit D level. Will start Evista 60 mg daily.  #30, RF one.  Discussed side effects and risk of fatal stroke and thromboembolic events.  Follow up in one month. Repeat BMD in 2 years.  An After Visit Summary was printed and given to the patient.  _25_____ minutes face to face time of which over 50% was spent in counseling.

## 2014-10-29 NOTE — Patient Instructions (Addendum)
Osteoporosis Osteoporosis is the thinning and loss of density in the bones. Osteoporosis makes the bones more brittle, fragile, and likely to break (fracture). Over time, osteoporosis can cause the bones to become so weak that they fracture after a simple fall. The bones most likely to fracture are the bones in the hip, wrist, and spine. CAUSES  The exact cause is not known. RISK FACTORS Anyone can develop osteoporosis. You may be at greater risk if you have a family history of the condition or have poor nutrition. You may also have a higher risk if you are:   Female.   63 years old or older.  A smoker.  Not physically active.   White or Asian.  Slender. SIGNS AND SYMPTOMS  A fracture might be the first sign of the disease, especially if it results from a fall or injury that would not usually cause a bone to break. Other signs and symptoms include:   Low back and neck pain.  Stooped posture.  Height loss. DIAGNOSIS  To make a diagnosis, your health care provider may:  Take a medical history.  Perform a physical exam.  Order tests, such as:  A bone mineral density test.  A dual-energy X-ray absorptiometry test. TREATMENT  The goal of osteoporosis treatment is to strengthen your bones to reduce your risk of a fracture. Treatment may involve:  Making lifestyle changes, such as:  Eating a diet rich in calcium.  Doing weight-bearing and muscle-strengthening exercises.  Stopping tobacco use.  Limiting alcohol intake.  Taking medicine to slow the process of bone loss or to increase bone density.  Monitoring your levels of calcium and vitamin D. HOME CARE INSTRUCTIONS  Include calcium and vitamin D in your diet. Calcium is important for bone health, and vitamin D helps the body absorb calcium.  Perform weight-bearing and muscle-strengthening exercises as directed by your health care provider.  Do not use any tobacco products, including cigarettes, chewing  tobacco, and electronic cigarettes. If you need help quitting, ask your health care provider.  Limit your alcohol intake.  Take medicines only as directed by your health care provider.  Keep all follow-up visits as directed by your health care provider. This is important.  Take precautions at home to lower your risk of falling, such as:  Keeping rooms well lit and clutter free.  Installing safety rails on stairs.  Using rubber mats in the bathroom and other areas that are often wet or slippery. SEEK IMMEDIATE MEDICAL CARE IF:  You fall or injure yourself.    This information is not intended to replace advice given to you by your health care provider. Make sure you discuss any questions you have with your health care provider.   Document Released: 10/06/2004 Document Revised: 01/17/2014 Document Reviewed: 06/06/2013 Elsevier Interactive Patient Education 2016 Elsevier Inc.    Raloxifene tablets What is this medicine? RALOXIFENE (ral OX i feen) reduces the amount of calcium lost from bones. It is used to treat and prevent osteoporosis in women who have experienced menopause. This medicine may be used for other purposes; ask your health care provider or pharmacist if you have questions. What should I tell my health care provider before I take this medicine? They need to know if you have any of these conditions: -a history of blood clots -cancer -heart failure -liver disease -premenopausal -an unusual or allergic reaction to raloxifene, other medicines, foods, dyes, or preservatives -pregnant or trying to get pregnant -breast-feeding How should I use this medicine?  Take this medicine by mouth with a glass of water. Follow the directions on the prescription label. The tablets can be taken with or without food. Take your doses at regular intervals. Do not take your medicine more often than directed. Talk to your pediatrician regarding the use of this medicine in children. Special  care may be needed. Overdosage: If you think you have taken too much of this medicine contact a poison control center or emergency room at once. NOTE: This medicine is only for you. Do not share this medicine with others. What if I miss a dose? If you miss a dose, take it as soon as you can. If it is almost time for your next dose, take only that dose. Do not take double or extra doses. What may interact with this medicine? -ampicillin -cholestyramine -colestipol -diazepam -diazoxide -female hormones like hormone replacement therapy -lidocaine -warfarin This list may not describe all possible interactions. Give your health care provider a list of all the medicines, herbs, non-prescription drugs, or dietary supplements you use. Also tell them if you smoke, drink alcohol, or use illegal drugs. Some items may interact with your medicine. What should I watch for while using this medicine? Visit your doctor or health care professional for regular checks on your progress. Do not stop taking this medicine except on the advice of your doctor or health care professional. You should make sure you get enough calcium and vitamin D in your diet while you are taking this medicine. Discuss your dietary needs with your health care professional or nutritionist. Exercise may help to prevent bone loss. Discuss your exercise needs with your doctor or health care professional. This medicine can rarely cause blood clots. You should avoid long periods of bed rest while taking this medicine. If you are going to have surgery, tell your doctor or health care professional that you are taking this medicine. This medicine should be stopped at least 3 days before surgery. After surgery, it should be restarted only after you are walking again. It should not be restarted while you still need long periods of bed rest. You should not smoke while taking this medicine. Smoking may also increase your risk of blood clots. Smoking can  also decrease the effects of this medicine. This medicine does not prevent hot flashes. It may cause hot flashes in some patients at the start of therapy. What side effects may I notice from receiving this medicine? Side effects that you should report to your doctor or health care professional as soon as possible: -change in vision -chest pain -difficulty breathing -leg pain or swelling -skin rash, itching Side effects that usually do not require medical attention (report to your doctor or health care professional if they continue or are bothersome): -fluid build-up -leg cramps -stomach pain -sweating This list may not describe all possible side effects. Call your doctor for medical advice about side effects. You may report side effects to FDA at 1-800-FDA-1088. Where should I keep my medicine? Keep out of the reach of children. Store at room temperature between 15 and 30 degrees C (59 and 86 degrees F). Throw away any unused medicine after the expiration date. NOTE: This sheet is a summary. It may not cover all possible information. If you have questions about this medicine, talk to your doctor, pharmacist, or health care provider.    2016, Elsevier/Gold Standard. (2007-12-13 15:15:14)

## 2014-10-30 LAB — PARATHYROID HORMONE, INTACT (NO CA): PTH: 40 pg/mL (ref 14–64)

## 2014-10-30 LAB — VITAMIN D 25 HYDROXY (VIT D DEFICIENCY, FRACTURES): Vit D, 25-Hydroxy: 27 ng/mL — ABNORMAL LOW (ref 30–100)

## 2014-10-31 ENCOUNTER — Telehealth: Payer: Self-pay

## 2014-10-31 NOTE — Telephone Encounter (Signed)
Spoke with patient. Advised of results as seen below from Dr.Silva. Patient is agreeable and verbalizes understanding.  Routing to provider for final review. Patient agreeable to disposition. Will close encounter.     

## 2014-10-31 NOTE — Telephone Encounter (Signed)
-----   Message from Nunzio Cobbs, MD sent at 10/31/2014  5:09 AM EDT ----- Please let patient know of her test results. Her parathyroid hormone is normal.  Her vit D is slightly low, and I would like her to add vit D 1000 IU additional to her daily routine.  She may being her Evista.   Cc- Marisa Sprinkles

## 2014-11-28 ENCOUNTER — Telehealth: Payer: Self-pay | Admitting: Internal Medicine

## 2014-11-28 MED ORDER — PANTOPRAZOLE SODIUM 40 MG PO TBEC: 40.0000 mg | DELAYED_RELEASE_TABLET | Freq: Two times a day (BID) | ORAL | Status: DC

## 2014-11-28 NOTE — Telephone Encounter (Signed)
Rx sent 

## 2014-12-03 ENCOUNTER — Ambulatory Visit: Payer: BC Managed Care – PPO | Admitting: Obstetrics and Gynecology

## 2014-12-10 ENCOUNTER — Ambulatory Visit (INDEPENDENT_AMBULATORY_CARE_PROVIDER_SITE_OTHER): Payer: BC Managed Care – PPO | Admitting: Obstetrics and Gynecology

## 2014-12-10 ENCOUNTER — Encounter: Payer: Self-pay | Admitting: Obstetrics and Gynecology

## 2014-12-10 VITALS — BP 140/88 | HR 84 | Resp 16 | Ht 62.0 in | Wt 145.0 lb

## 2014-12-10 DIAGNOSIS — M810 Age-related osteoporosis without current pathological fracture: Secondary | ICD-10-CM | POA: Diagnosis not present

## 2014-12-10 MED ORDER — RALOXIFENE HCL 60 MG PO TABS: 60.0000 mg | ORAL_TABLET | Freq: Every day | ORAL | Status: DC

## 2014-12-10 NOTE — Progress Notes (Signed)
GYNECOLOGY  VISIT   HPI: 64 y.o.   Married  Caucasian  female   G1P1001 with Patient's last menstrual period was 01/10/1998.   here for Medication Follow up - Evista 60 mg daily for osteoporosis.  Having leg cramps and hot flashes at night.  Takes Evista at supper time.  Did have some achy feeling after starting Evista, but this went away.  The symptoms are not enough for her to want to stop the Evista.   Also calcium with vit D twice a day and taking a MVI which has calcium 500 mg per day and vit D 1000 IU.  Taking an additional 2000 IU daily after office called about vit D level being 27.   Has reflux and globus sensation. Has had a normal evaluation with ENT. Has a chronic cough for 20 years.  Has allergies.  Treating with Protonix   GYNECOLOGIC HISTORY: Patient's last menstrual period was 01/10/1998. Contraception: Post menopausal  Menopausal hormone therapy: None Last mammogram: 09/17/14 BIRADS1:Neg Last pap smear: 07/24/13 Neg. HR HPV:neg        OB History    Gravida Para Term Preterm AB TAB SAB Ectopic Multiple Living   1 1 1  0 0 0 0 0 1 1         Patient Active Problem List   Diagnosis Date Noted  . History of colonic polyps   . Benign neoplasm of ascending colon   . Benign neoplasm of transverse colon   . Impaired glucose tolerance 06/07/2013  . Esophageal reflux 09/24/2010  . Globus sensation 09/24/2010  . Raynauds phenomenon 09/24/2010  . VASOMOTOR RHINITIS 02/12/2007  . COLONIC POLYPS, HX OF 02/12/2007    Past Medical History  Diagnosis Date  . Personal history of colonic polyps 06/16/2003    hyperplastic  . VASOMOTOR RHINITIS   . IBS (irritable bowel syndrome)   . Cataract   . GERD (gastroesophageal reflux disease)   . Blood transfusion without reported diagnosis 1987    after childbirth  . Endometriosis   . PONV (postoperative nausea and vomiting)   . Arthritis     fingers    Past Surgical History  Procedure Laterality Date  . Breast  biopsy Right 1990  . Mandible surgery  1985  . Tonsillectomy  1957  . Maxillary cyst excision Left 1968  . Laparoscopy  1986    endometriosis-minimal  . Colonoscopy w/ polypectomy    . Colonoscopy N/A 06/24/2014    Procedure: COLONOSCOPY;  Surgeon: Jerene Bears, MD;  Location: [facility];  Service: Gastroenterology;  Laterality: N/A;  . Hot hemostasis N/A 06/24/2014    Procedure: HOT HEMOSTASIS (ARGON PLASMA COAGULATION/BICAP);  Surgeon: Jerene Bears, MD;  Location: [facility];  Service: Gastroenterology;  Laterality: N/A;    Current Outpatient Prescriptions  Medication Sig Dispense Refill  . Calcium Carbonate-Vit D-Min (CALCIUM 1200 PO) Take by mouth daily.    . Cholecalciferol (VITAMIN D) 2000 UNITS tablet Take 2,000 Units by mouth daily.    . fluticasone (FLONASE) 50 MCG/ACT nasal spray Place 2 sprays into the nose daily. 16 g 6  . ibuprofen (ADVIL,MOTRIN) 200 MG tablet Take 200 mg by mouth every 6 (six) hours as needed for mild pain.    . Multiple Vitamin (MULTIVITAMIN) capsule Take 1 capsule by mouth daily.      . pantoprazole (PROTONIX) 40 MG tablet Take 1 tablet (40 mg total) by mouth 2 (two) times daily. 60 tablet 2  . raloxifene (EVISTA) 60 MG tablet Take  1 tablet (60 mg total) by mouth daily. 30 tablet 1   No current facility-administered medications for this visit.     ALLERGIES: Omeprazole and Sulfa antibiotics  Family History  Problem Relation Age of Onset  . Lung cancer Mother 29    non smoker  . Bladder Cancer Father 59    bladder that spread to pancreas  . Heart attack Father   . Diabetes Father   . Colon polyps Father   . Colon cancer Neg Hx   . Prostate cancer Brother   . Diabetes Paternal Grandfather   . Heart disease Maternal Grandmother   . Hypertension Maternal Grandmother   . Osteoporosis Maternal Grandmother   . Heart disease Paternal Grandmother   . Hypertension Paternal Grandmother     Social History   Social History  . Marital Status:  Married    Spouse Name: N/A  . Number of Children: 1  . Years of Education: N/A   Occupational History  . retired Education officer, museum    Social History Main Topics  . Smoking status: Never Smoker   . Smokeless tobacco: Never Used  . Alcohol Use: 0.0 oz/week    0 Standard drinks or equivalent per week     Comment: rarely  . Drug Use: No  . Sexual Activity:    Partners: Male    Birth Control/ Protection: Post-menopausal   Other Topics Concern  . Not on file   Social History Narrative    ROS:  Pertinent items are noted in HPI.  PHYSICAL EXAMINATION:    BP 140/88 mmHg  Pulse 84  Resp 16  Ht 5\' 2"  (1.575 m)  Wt 145 lb (65.772 kg)  BMI 26.51 kg/m2  LMP 01/10/1998    General appearance: alert, cooperative and appears stated age  Chaperone was present for exam.  ASSESSMENT  Osteoporosis.  Mildly elevated BP today.  Has white coat syndrome.   PCP aware.   PLAN  Counseled regarding osteoporosis and medical options - Evista, Prolia, Reclast.  Will continue with Evista 60 mg daily but take in the am.  Will eliminate one calcium and vit D combo tablet per day.  Follow up with PCP for blood pressure recheck and routine exam.  I did discuss again the risk of fatal stroke of 1:1000 while taking Evista.  If risk factors for CVD and stroke increase, will need to consider coming off the Evista.  Next bone density in 2 years. Continue weight bearing exercise.  Follow up for annual exam with [facility] in July 2017.   An After Visit Summary was printed and given to the patient.  _15_____ minutes face to face time of which over 50% was spent in counseling.

## 2014-12-26 ENCOUNTER — Telehealth: Payer: Self-pay | Admitting: *Deleted

## 2014-12-26 NOTE — Telephone Encounter (Signed)
Pantoprazole has been approved from 11/18/14-12/18/15. Case  ID [id number].

## 2015-06-09 ENCOUNTER — Other Ambulatory Visit: Payer: Self-pay | Admitting: Internal Medicine

## 2015-06-15 ENCOUNTER — Other Ambulatory Visit: Payer: Self-pay | Admitting: Internal Medicine

## 2015-06-17 ENCOUNTER — Telehealth: Payer: Self-pay | Admitting: Internal Medicine

## 2015-06-17 MED ORDER — PANTOPRAZOLE SODIUM 40 MG PO TBEC: 40.0000 mg | DELAYED_RELEASE_TABLET | Freq: Two times a day (BID) | ORAL | Status: DC

## 2015-06-17 NOTE — Telephone Encounter (Signed)
Rx sent 

## 2015-07-24 ENCOUNTER — Encounter: Payer: Self-pay | Admitting: *Deleted

## 2015-08-25 ENCOUNTER — Encounter (INDEPENDENT_AMBULATORY_CARE_PROVIDER_SITE_OTHER): Payer: Self-pay

## 2015-08-25 ENCOUNTER — Ambulatory Visit (INDEPENDENT_AMBULATORY_CARE_PROVIDER_SITE_OTHER): Payer: Medicare Other | Admitting: Internal Medicine

## 2015-08-25 ENCOUNTER — Encounter: Payer: Self-pay | Admitting: Internal Medicine

## 2015-08-25 VITALS — BP 148/88 | HR 100 | Ht 62.0 in | Wt 146.2 lb

## 2015-08-25 DIAGNOSIS — Z86010 Personal history of colonic polyps: Secondary | ICD-10-CM

## 2015-08-25 DIAGNOSIS — K219 Gastro-esophageal reflux disease without esophagitis: Secondary | ICD-10-CM | POA: Diagnosis not present

## 2015-08-25 MED ORDER — PANTOPRAZOLE SODIUM 40 MG PO TBEC: 40.0000 mg | DELAYED_RELEASE_TABLET | Freq: Every day | ORAL | 10 refills | Status: DC

## 2015-08-25 NOTE — Patient Instructions (Signed)
We have sent the following medications to your pharmacy for you to pick up at your convenience: Pantoprazole 40 mg daily  Please follow up with Dr Hilarie Fredrickson in 1 year.  If you are age 65 or older, your body mass index should be between 23-30. Your Body mass index is 26.74 kg/m. If this is out of the aforementioned range listed, please consider follow up with your Primary Care Provider.  If you are age 27 or younger, your body mass index should be between 19-25. Your Body mass index is 26.74 kg/m. If this is out of the aformentioned range listed, please consider follow up with your Primary Care Provider.

## 2015-08-27 NOTE — Progress Notes (Signed)
   Subjective:    Patient ID: Stephanie Grimes, female    DOB: 09-06-1950, 65 y.o.   MRN: [id number]  HPI Stephanie Grimes is a 65 year old female with history of adenomatous colon polyps, GERD and chronic cough who is here for follow-up. She reports that she has been doing very well recently. She has been maintained on pantoprazole 40 mg daily. With this medication her reflux and cough have improved significantly. She still has some cough but overall this is better than prior to starting PPI. She denies dysphagia or odynophagia. No heartburn. No nausea or vomiting. No abdominal pain. Normal bowel movements without blood in her stool or melena.   She had 2 colonoscopies between 2015 and June 2016. At colonoscopy in 2015 she had peas no resection of ascending pedunculated adenomatous polyp in the ascending colon. Surveillance colonoscopy was done on 06/24/2014 to ensure complete resection. Mucosal tattoo was found in the ascending colon with no residual adenomatous tissue seen. 4 other small polyps were removed with cold snare and cold forceps from the right colon, 2 small polyps from the transverse colon also removed. All biopsies showed benign polypoid colorectal mucosa and hyperplastic polyps without adenomatous change.  Review of Systems As per history of present illness, otherwise negative  Current Medications, Allergies, Past Medical History, Past Surgical History, Family History and Social History were reviewed in [employer] record.     Objective:   Physical Exam BP (!) 148/88 (BP Location: Left Arm, Patient Position: Sitting, Cuff Size: Normal)   Pulse 100   Ht 5\' 2"  (1.575 m)   Wt 146 lb 3.2 oz (66.3 kg)   LMP 01/10/1998   BMI 26.74 kg/m  Constitutional: Well-developed and well-nourished. No distress. HEENT: Normocephalic and atraumatic. Oropharynx is clear and moist. No oropharyngeal exudate. Conjunctivae are normal.  No scleral icterus. Neck: Neck supple. Trachea  midline. Cardiovascular: Normal rate, regular rhythm and intact distal pulses. No M/R/G Pulmonary/chest: Effort normal and breath sounds normal. No wheezing, rales or rhonchi. Abdominal: Soft, nontender, nondistended. Bowel sounds active throughout. There are no masses palpable. No hepatosplenomegaly. Extremities: no clubbing, cyanosis, or edema Neurological: Alert and oriented to person place and time. Skin: Skin is warm and dry. Psychiatric: Normal mood and affect. Behavior is normal.      Assessment & Plan:   65 year old female with history of adenomatous colon polyps, GERD and chronic cough who is here for follow-up.   1. GERD with LPR symptoms/chronic cough -- Well-controlled heartburn and improvement in cough with PPI therapy. EGD in October 2015 normal. We'll continue pantoprazole 40 mg daily, reflux precautions and GERD diet. Follow-up in one year, sooner if necessary  2. History of adenomatous colon polyps -- surveillance colonoscopy recommended June 2019  15 minutes spent with the patient today. Greater than 50% was spent in counseling and coordination of care with the patient

## 2015-10-22 ENCOUNTER — Encounter: Payer: Self-pay | Admitting: Nurse Practitioner

## 2016-02-10 ENCOUNTER — Other Ambulatory Visit: Payer: Self-pay | Admitting: Nurse Practitioner

## 2016-02-10 MED ORDER — RALOXIFENE HCL 60 MG PO TABS: 60.0000 mg | ORAL_TABLET | Freq: Every day | ORAL | 5 refills | Status: DC

## 2016-02-10 NOTE — Telephone Encounter (Signed)
Medication refill request: Raloxifene Last AEX:  07/29/14 PG Next AEX: 07/29/16 PG Last MMG (if hormonal medication request): 10/14/15 BIRADS1, D[facility] A, [facility] Refill authorized: 12/10/14 #[id number] 7R. Please advise. Thank you.

## 2016-02-10 NOTE — Telephone Encounter (Signed)
Patient needing refill for Raloxisene sent to [facility] on [address] at [phone].

## 2016-05-06 ENCOUNTER — Encounter: Payer: Self-pay | Admitting: Internal Medicine

## 2016-05-06 ENCOUNTER — Ambulatory Visit (INDEPENDENT_AMBULATORY_CARE_PROVIDER_SITE_OTHER): Payer: Medicare Other | Admitting: Internal Medicine

## 2016-05-06 VITALS — BP 158/88 | HR 96 | Temp 98.9°F | Ht 61.0 in | Wt 146.8 lb

## 2016-05-06 DIAGNOSIS — Z0000 Encounter for general adult medical examination without abnormal findings: Secondary | ICD-10-CM

## 2016-05-06 DIAGNOSIS — R7302 Impaired glucose tolerance (oral): Secondary | ICD-10-CM

## 2016-05-06 DIAGNOSIS — Z86010 Personal history of colonic polyps: Secondary | ICD-10-CM | POA: Diagnosis not present

## 2016-05-06 LAB — COMPREHENSIVE METABOLIC PANEL WITH GFR
ALT: 46 U/L — ABNORMAL HIGH (ref 0–35)
AST: 27 U/L (ref 0–37)
Albumin: 4.3 g/dL (ref 3.5–5.2)
Alkaline Phosphatase: 75 U/L (ref 39–117)
BUN: 19 mg/dL (ref 6–23)
CO2: 29 meq/L (ref 19–32)
Calcium: 9.6 mg/dL (ref 8.4–10.5)
Chloride: 102 meq/L (ref 96–112)
Creatinine, Ser: 0.68 mg/dL (ref 0.40–1.20)
GFR: 92.08 mL/min
Glucose, Bld: 232 mg/dL — ABNORMAL HIGH (ref 70–99)
Potassium: 4.3 meq/L (ref 3.5–5.1)
Sodium: 140 meq/L (ref 135–145)
Total Bilirubin: 0.5 mg/dL (ref 0.2–1.2)
Total Protein: 7 g/dL (ref 6.0–8.3)

## 2016-05-06 LAB — CBC WITH DIFFERENTIAL/PLATELET
Basophils Absolute: 0 10*3/uL (ref 0.0–0.1)
Basophils Relative: 0.5 % (ref 0.0–3.0)
EOS PCT: 2 % (ref 0.0–5.0)
Eosinophils Absolute: 0.2 10*3/uL (ref 0.0–0.7)
HCT: 39.8 % (ref 36.0–46.0)
Hemoglobin: 13.5 g/dL (ref 12.0–15.0)
LYMPHS ABS: 2.5 10*3/uL (ref 0.7–4.0)
Lymphocytes Relative: 32.4 % (ref 12.0–46.0)
MCHC: 34 g/dL (ref 30.0–36.0)
MCV: 92.1 fl (ref 78.0–100.0)
MONO ABS: 0.5 10*3/uL (ref 0.1–1.0)
Monocytes Relative: 6.8 % (ref 3.0–12.0)
NEUTROS ABS: 4.5 10*3/uL (ref 1.4–7.7)
NEUTROS PCT: 58.3 % (ref 43.0–77.0)
PLATELETS: 262 10*3/uL (ref 150.0–400.0)
RBC: 4.33 Mil/uL (ref 3.87–5.11)
RDW: 13.4 % (ref 11.5–15.5)
WBC: 7.7 10*3/uL (ref 4.0–10.5)

## 2016-05-06 LAB — LIPID PANEL
CHOLESTEROL: 224 mg/dL — AB (ref 0–200)
HDL: 41.8 mg/dL (ref >39.00)
NonHDL: 182.68
Total CHOL/HDL Ratio: 5
Triglycerides: 237 mg/dL — ABNORMAL HIGH (ref 0.0–149.0)
VLDL: 47.4 mg/dL — ABNORMAL HIGH (ref 0.0–40.0)

## 2016-05-06 LAB — LDL CHOLESTEROL, DIRECT: Direct LDL: 146 mg/dL

## 2016-05-06 LAB — TSH: TSH: 1.3 u[IU]/mL (ref 0.35–4.50)

## 2016-05-06 NOTE — Patient Instructions (Addendum)
Health Maintenance for Postmenopausal Women Menopause is a normal process in which your reproductive ability comes to an end. This process happens gradually over a span of months to years, usually between the ages of 48 and 55. Menopause is complete when you have missed 12 consecutive menstrual periods. It is important to talk with your health care provider about some of the most common conditions that affect postmenopausal women, such as heart disease, cancer, and bone loss (osteoporosis). Adopting a healthy lifestyle and getting preventive care can help to promote your health and wellness. Those actions can also lower your chances of developing some of these common conditions. What should I know about menopause? During menopause, you may experience a number of symptoms, such as:  Moderate-to-severe hot flashes.  Night sweats.  Decrease in sex drive.  Mood swings.  Headaches.  Tiredness.  Irritability.  Memory problems.  Insomnia. Choosing to treat or not to treat menopausal changes is an individual decision that you make with your health care provider. What should I know about hormone replacement therapy and supplements? Hormone therapy products are effective for treating symptoms that are associated with menopause, such as hot flashes and night sweats. Hormone replacement carries certain risks, especially as you become older. If you are thinking about using estrogen or estrogen with progestin treatments, discuss the benefits and risks with your health care provider. What should I know about heart disease and stroke? Heart disease, heart attack, and stroke become more likely as you age. This may be due, in part, to the hormonal changes that your body experiences during menopause. These can affect how your body processes dietary fats, triglycerides, and cholesterol. Heart attack and stroke are both medical emergencies. There are many things that you can do to help prevent heart disease  and stroke:  Have your blood pressure checked at least every 1-2 years. High blood pressure causes heart disease and increases the risk of stroke.  If you are 55-79 years old, ask your health care provider if you should take aspirin to prevent a heart attack or a stroke.  Do not use any tobacco products, including cigarettes, chewing tobacco, or electronic cigarettes. If you need help quitting, ask your health care provider.  It is important to eat a healthy diet and maintain a healthy weight.  Be sure to include plenty of vegetables, fruits, low-fat dairy products, and lean protein.  Avoid eating foods that are high in solid fats, added sugars, or salt (sodium).  Get regular exercise. This is one of the most important things that you can do for your health.  Try to exercise for at least 150 minutes each week. The type of exercise that you do should increase your heart rate and make you sweat. This is known as moderate-intensity exercise.  Try to do strengthening exercises at least twice each week. Do these in addition to the moderate-intensity exercise.  Know your numbers.Ask your health care provider to check your cholesterol and your blood glucose. Continue to have your blood tested as directed by your health care provider. What should I know about cancer screening? There are several types of cancer. Take the following steps to reduce your risk and to catch any cancer development as early as possible. Breast Cancer  Practice breast self-awareness.  This means understanding how your breasts normally appear and feel.  It also means doing regular breast self-exams. Let your health care provider know about any changes, no matter how small.  If you are 40 or older,   have a clinician do a breast exam (clinical breast exam or CBE) every year. Depending on your age, family history, and medical history, it may be recommended that you also have a yearly breast X-ray (mammogram).  If you  have a family history of breast cancer, talk with your health care provider about genetic screening.  If you are at high risk for breast cancer, talk with your health care provider about having an MRI and a mammogram every year.  Breast cancer (BRCA) gene test is recommended for women who have family members with BRCA-related cancers. Results of the assessment will determine the need for genetic counseling and BRCA1 and for BRCA2 testing. BRCA-related cancers include these types:  Breast. This occurs in males or females.  Ovarian.  Tubal. This may also be called fallopian tube cancer.  Cancer of the abdominal or pelvic lining (peritoneal cancer).  Prostate.  Pancreatic. Cervical, Uterine, and Ovarian Cancer  Your health care provider may recommend that you be screened regularly for cancer of the pelvic organs. These include your ovaries, uterus, and vagina. This screening involves a pelvic exam, which includes checking for microscopic changes to the surface of your cervix (Pap test).  For women ages 21-65, health care providers may recommend a pelvic exam and a Pap test every three years. For women ages 30-65, they may recommend the Pap test and pelvic exam, combined with testing for human papilloma virus (HPV), every five years. Some types of HPV increase your risk of cervical cancer. Testing for HPV may also be done on women of any age who have unclear Pap test results.  Other health care providers may not recommend any screening for nonpregnant women who are considered low risk for pelvic cancer and have no symptoms. Ask your health care provider if a screening pelvic exam is right for you.  If you have had past treatment for cervical cancer or a condition that could lead to cancer, you need Pap tests and screening for cancer for at least 20 years after your treatment. If Pap tests have been discontinued for you, your risk factors (such as having a new sexual partner) need to be reassessed  to determine if you should start having screenings again. Some women have medical problems that increase the chance of getting cervical cancer. In these cases, your health care provider may recommend that you have screening and Pap tests more often.  If you have a family history of uterine cancer or ovarian cancer, talk with your health care provider about genetic screening.  If you have vaginal bleeding after reaching menopause, tell your health care provider.  There are currently no reliable tests available to screen for ovarian cancer. Lung Cancer  Lung cancer screening is recommended for adults 55-80 years old who are at high risk for lung cancer because of a history of smoking. A yearly low-dose CT scan of the lungs is recommended if you:  Currently smoke.  Have a history of at least 30 pack-years of smoking and you currently smoke or have quit within the past 15 years. A pack-year is smoking an average of one pack of cigarettes per day for one year. Yearly screening should:  Continue until it has been 15 years since you quit.  Stop if you develop a health problem that would prevent you from having lung cancer treatment. Colorectal Cancer  This type of cancer can be detected and can often be prevented.  Routine colorectal cancer screening usually begins at age 50 and continues   through age 75.  If you have risk factors for colon cancer, your health care provider may recommend that you be screened at an earlier age.  If you have a family history of colorectal cancer, talk with your health care provider about genetic screening.  Your health care provider may also recommend using home test kits to check for hidden blood in your stool.  A small camera at the end of a tube can be used to examine your colon directly (sigmoidoscopy or colonoscopy). This is done to check for the earliest forms of colorectal cancer.  Direct examination of the colon should be repeated every 5-10 years until  age 75. However, if early forms of precancerous polyps or small growths are found or if you have a family history or genetic risk for colorectal cancer, you may need to be screened more often. Skin Cancer  Check your skin from head to toe regularly.  Monitor any moles. Be sure to tell your health care provider:  About any new moles or changes in moles, especially if there is a change in a mole's shape or color.  If you have a mole that is larger than the size of a pencil eraser.  If any of your family members has a history of skin cancer, especially at a young age, talk with your health care provider about genetic screening.  Always use sunscreen. Apply sunscreen liberally and repeatedly throughout the day.  Whenever you are outside, protect yourself by wearing long sleeves, pants, a wide-brimmed hat, and sunglasses. What should I know about osteoporosis? Osteoporosis is a condition in which bone destruction happens more quickly than new bone creation. After menopause, you may be at an increased risk for osteoporosis. To help prevent osteoporosis or the bone fractures that can happen because of osteoporosis, the following is recommended:  If you are 19-50 years old, get at least 1,000 mg of calcium and at least 600 mg of vitamin D per day.  If you are older than age 50 but younger than age 70, get at least 1,200 mg of calcium and at least 600 mg of vitamin D per day.  If you are older than age 70, get at least 1,200 mg of calcium and at least 800 mg of vitamin D per day. Smoking and excessive alcohol intake increase the risk of osteoporosis. Eat foods that are rich in calcium and vitamin D, and do weight-bearing exercises several times each week as directed by your health care provider. What should I know about how menopause affects my mental health? Depression may occur at any age, but it is more common as you become older. Common symptoms of depression include:  Low or sad  mood.  Changes in sleep patterns.  Changes in appetite or eating patterns.  Feeling an overall lack of motivation or enjoyment of activities that you previously enjoyed.  Frequent crying spells. Talk with your health care provider if you think that you are experiencing depression. What should I know about immunizations? It is important that you get and maintain your immunizations. These include:  Tetanus, diphtheria, and pertussis (Tdap) booster vaccine.  Influenza every year before the flu season begins.  Pneumonia vaccine.  Shingles vaccine. Your health care provider may also recommend other immunizations. This information is not intended to replace advice given to you by your health care provider. Make sure you discuss any questions you have with your health care provider. Document Released: 02/18/2005 Document Revised: 07/17/2015 Document Reviewed: 09/30/2014 Elsevier Interactive Patient   Education  2017 [employer] Inc.  

## 2016-05-06 NOTE — Progress Notes (Signed)
Pre visit review using our clinic review tool, if applicable. No additional management support is needed unless otherwise documented below in the visit note. 

## 2016-05-06 NOTE — Progress Notes (Signed)
Subjective:    Patient ID: Stephanie Grimes, female    DOB: April 28, 1950, 66 y.o.   MRN: [id number]  HPI  66 year old patient who is seen today for a welcome to Medicare visit She is followed by gynecology and GI.  She does have a history of colonic polyps Complaints the include osteoarthritic pain involving the small joints of the hands. Otherwise, doing quite well.  Current Allergies:  ! SULF-10 (SULFACETAMIDE SODIUM)   Past Medical History:  Colonic polyps, hx of  vasomotor rhinitis  GERD IGT  Past Surgical History:  jaw surgery in 1986  breast biopsy 1987   Family History:  father died age 27, bladder cancer, possibly pancreatic cancer  mother died age 60, lung cancer-nonsmoker  One brother one sister are in good health    Social History:  Retired Education officer, museum with a masters degree  Married 2 grandchildren   Medicare wellness  1. Risk factors, based on past  M,S,F history.  No significant cardiac vascular risk factors  2.  Physical activities: remains quite active.  Enjoys gardening and does walk most days of the week  3.  Depression/mood:no history major depression or mood disorder  4.  Hearing: mild deficits  5.  ADL's:independent  6.  Fall risk:low  7.  Home safety:no prongs identified  8.  Height weight, and visual acuity;height and weight stable no change in visual acuity does have periodic bone density studies  9.  Counseling:continue active lifestyle and heart healthy diet  10. Lab orders based on risk factors:laboratory update will be reviewed.  Lipid profile will be reviewed  11. Referral :follow-up OB/GYN and GI  12. Care plan:continue efforts at aggressive risk factor modification  13. Cognitive assessment: alert and appropriate normal affect no cognitive dysfunction 14. Screening: Patient provided with a written and personalized 5-10 year screening schedule in the AVS.    15. Provider List Update: primary care OB/GYN.  GI  She is also  followed by dermatology and ophthalmology     Review of Systems  Constitutional: Negative.   HENT: Negative for congestion, dental problem, hearing loss, rhinorrhea, sinus pressure, sore throat and tinnitus.   Eyes: Negative for pain, discharge and visual disturbance.  Respiratory: Positive for cough. Negative for shortness of breath.   Cardiovascular: Negative for chest pain, palpitations and leg swelling.  Gastrointestinal: Negative for abdominal distention, abdominal pain, blood in stool, constipation, diarrhea, nausea and vomiting.  Genitourinary: Negative for difficulty urinating, dysuria, flank pain, frequency, hematuria, pelvic pain, urgency, vaginal bleeding, vaginal discharge and vaginal pain.  Musculoskeletal: Negative for arthralgias, gait problem and joint swelling.  Skin: Negative for rash.  Neurological: Negative for dizziness, syncope, speech difficulty, weakness, numbness and headaches.  Hematological: Negative for adenopathy.  Psychiatric/Behavioral: Negative for agitation, behavioral problems and dysphoric mood. The patient is not nervous/anxious.        Objective:   Physical Exam  Constitutional: She is oriented to person, place, and time. She appears well-developed and well-nourished.  HENT:  Head: Normocephalic.  Right Ear: External ear normal.  Left Ear: External ear normal.  Mouth/Throat: Oropharynx is clear and moist.  Eyes: Conjunctivae and EOM are normal. Pupils are equal, round, and reactive to light.  Neck: Normal range of motion. Neck supple. No thyromegaly present.  Cardiovascular: Normal rate, regular rhythm, normal heart sounds and intact distal pulses.   Pedal pulses full  Pulmonary/Chest: Effort normal and breath sounds normal.  Abdominal: Soft. Bowel sounds are normal. She exhibits no mass. There is no  tenderness.  Musculoskeletal: Normal range of motion.  Osteoarthritic changes involving the small joints of the hands  Lymphadenopathy:    She  has no cervical adenopathy.  Neurological: She is alert and oriented to person, place, and time.  Skin: Skin is warm and dry. No rash noted.  Scattered superficial varicosities lower extremities area.  No edema  Psychiatric: She has a normal mood and affect. Her behavior is normal.          Assessment & Plan:   Welcome to Medicare visit Preventive health exam Chronic cough.  This seems to be multi-factorial.  At times, clearly related to a upper airway cough syndrome.  She also is on PPI therapy for reflux that at times aggravates cough.  Continue fluticasone and as needed.  Antihistamines.  She has had a ENT evaluation History colonic polyps   Preventive health.  We'll check screening lab  [employer]

## 2016-05-09 ENCOUNTER — Telehealth: Payer: Self-pay | Admitting: Internal Medicine

## 2016-05-09 ENCOUNTER — Other Ambulatory Visit: Payer: Self-pay | Admitting: Internal Medicine

## 2016-05-09 MED ORDER — METFORMIN HCL ER 500 MG PO TB24: 1000.0000 mg | ORAL_TABLET | Freq: Every day | ORAL | 0 refills | Status: DC

## 2016-05-09 NOTE — Telephone Encounter (Signed)
Paty pt returned your call °

## 2016-05-10 NOTE — Telephone Encounter (Signed)
Patient informed that blood work revealed an elevated blood sugar consistent with early diabetes. Pt verbalized understanding  Return office visit within the next 2 weeks. Pt scheduled.

## 2016-05-24 ENCOUNTER — Encounter: Payer: Self-pay | Admitting: Internal Medicine

## 2016-05-24 ENCOUNTER — Ambulatory Visit (INDEPENDENT_AMBULATORY_CARE_PROVIDER_SITE_OTHER): Payer: Medicare Other | Admitting: Internal Medicine

## 2016-05-24 DIAGNOSIS — E119 Type 2 diabetes mellitus without complications: Secondary | ICD-10-CM | POA: Diagnosis not present

## 2016-05-24 DIAGNOSIS — E118 Type 2 diabetes mellitus with unspecified complications: Secondary | ICD-10-CM | POA: Insufficient documentation

## 2016-05-24 LAB — POCT GLYCOSYLATED HEMOGLOBIN (HGB A1C): Hemoglobin A1C: 7.4

## 2016-05-24 NOTE — Progress Notes (Signed)
Subjective:    Patient ID: Stephanie Grimes, female    DOB: Apr 15, 1950, 66 y.o.   MRN: [id number]  HPI  66 year old patient who is seen today with new onset diabetes.  Laboratory screen.  2 weeks ago revealed a fasting blood sugar greater than 200.  She presently is on metformin therapy 1000 mg daily with breakfast.  She has tolerated this medication well. Strong family history of diabetes.  Patient has a prior history of impaired glucose tolerance Otherwise, doing well  Past Medical History:  Diagnosis Date  . Adenomatous colon polyp   . Arthritis    fingers  . Blood transfusion without reported diagnosis 1987   after childbirth  . Cataract   . Endometriosis   . GERD (gastroesophageal reflux disease)   . IBS (irritable bowel syndrome)   . Personal history of colonic polyps 06/16/2003   hyperplastic  . PONV (postoperative nausea and vomiting)   . VASOMOTOR RHINITIS      Social History   Social History  . Marital status: Married    Spouse name: N/A  . Number of children: 1  . Years of education: N/A   Occupational History  . retired Education officer, [employer]    Social History Main Topics  . Smoking status: Never Smoker  . Smokeless tobacco: Never Used  . Alcohol use 0.0 oz/week     Comment: rarely  . Drug use: No  . Sexual activity: Yes    Partners: Male    Birth control/ protection: Post-menopausal   Other Topics Concern  . Not on file   Social History Narrative  . No narrative on file    Past Surgical History:  Procedure Laterality Date  . BREAST BIOPSY Right 1990  . COLONOSCOPY N/A 06/24/2014   Procedure: COLONOSCOPY;  Surgeon: Jerene Bears, MD;  Location: [facility];  Service: Gastroenterology;  Laterality: N/A;  . COLONOSCOPY W/ POLYPECTOMY    . HOT HEMOSTASIS N/A 06/24/2014   Procedure: HOT HEMOSTASIS (ARGON PLASMA COAGULATION/BICAP);  Surgeon: Jerene Bears, MD;  Location: [facility];  Service: Gastroenterology;  Laterality: N/A;  . [name]  . MAXILLARY CYST EXCISION Left 1968  . TONSILLECTOMY  1957    Family History  Problem Relation Age of Onset  . Lung cancer Mother 85       non smoker  . Bladder Cancer Father 6       bladder that spread to pancreas  . Heart attack Father   . Diabetes Father   . Colon polyps Father   . Prostate cancer Brother   . Diabetes Paternal Grandfather   . Heart disease Maternal Grandmother   . Hypertension Maternal Grandmother   . Osteoporosis Maternal Grandmother   . Heart disease Paternal Grandmother   . Hypertension Paternal Grandmother   . Colon cancer Neg Hx     Allergies  Allergen Reactions  . Omeprazole     diarrhea  . Sulfa Antibiotics Swelling    Current Outpatient Prescriptions on File Prior to Visit  Medication Sig Dispense Refill  . Calcium Carbonate-Vit D-Min (CALCIUM 1200 PO) Take by mouth daily.    . Cholecalciferol (VITAMIN D) 2000 UNITS tablet Take 2,000 Units by mouth daily.    . fluticasone (FLONASE) 50 MCG/ACT nasal spray Place 2 sprays into the nose daily. (Patient taking differently: Place 2 sprays into the nose as needed. ) 16 g 6  . ibuprofen (ADVIL,MOTRIN) 200 MG tablet Take 200 mg  by mouth every 6 (six) hours as needed for mild pain.    . metFORMIN (GLUCOPHAGE-XR) 500 MG 24 hr tablet Take 2 tablets (1,000 mg total) by mouth daily with breakfast. 180 tablet 0  . Multiple Vitamin (MULTIVITAMIN) capsule Take 1 capsule by mouth daily.      . pantoprazole (PROTONIX) 40 MG tablet Take 1 tablet (40 mg total) by mouth daily. 30 tablet 10  . raloxifene (EVISTA) 60 MG tablet Take 1 tablet (60 mg total) by mouth daily. 30 tablet 5   No current facility-administered medications on file prior to visit.     BP 132/82 (BP Location: Left Arm, Patient Position: Sitting, Cuff Size: Normal)   Pulse 68   Temp 98 F (36.7 C) (Oral)   Wt 142 lb 9.6 oz (64.7 kg)   LMP 01/10/1998   SpO2 98%   BMI 26.94 kg/m     Review  of Systems  Constitutional: Negative.   HENT: Negative for congestion, dental problem, hearing loss, rhinorrhea, sinus pressure, sore throat and tinnitus.   Eyes: Negative for pain, discharge and visual disturbance.  Respiratory: Negative for cough and shortness of breath.   Cardiovascular: Negative for chest pain, palpitations and leg swelling.  Gastrointestinal: Negative for abdominal distention, abdominal pain, blood in stool, constipation, diarrhea, nausea and vomiting.  Genitourinary: Negative for difficulty urinating, dysuria, flank pain, frequency, hematuria, pelvic pain, urgency, vaginal bleeding, vaginal discharge and vaginal pain.  Musculoskeletal: Negative for arthralgias, gait problem and joint swelling.  Skin: Negative for rash.  Neurological: Negative for dizziness, syncope, speech difficulty, weakness, numbness and headaches.  Hematological: Negative for adenopathy.  Psychiatric/Behavioral: Negative for agitation, behavioral problems and dysphoric mood. The patient is not nervous/anxious.        Objective:   Physical Exam  Constitutional: She appears well-developed and well-nourished. No distress.  Blood pressure well controlled          Assessment & Plan:   New-onset diabetes.  Patient information dispensed.  She will consider seeing a nutritionist.  She was instructed on the use of a home glucometer Check hemoglobin A1c today and again in 3 months  [place]

## 2016-05-24 NOTE — Patient Instructions (Addendum)
WE NOW OFFER   [facility]ST [facility]!!!  SAME DAY Appointments for ACUTE CARE  Such as: Sprains, Injuries, cuts, abrasions, rashes, muscle pain, joint pain, back pain Colds, flu, sore throats, headache, allergies, cough, fever  Ear pain, sinus and eye infections Abdominal pain, nausea, vomiting, diarrhea, upset stomach Animal/insect bites  3 Easy Ways to Schedule: Walk-In Scheduling Call in scheduling Mychart Sign-up: [email or web address]     Please check your hemoglobin A1c every 3 months    It is important that you exercise regularly, at least 20 minutes 3 to 4 times per week.  If you develop chest pain or shortness of breath seek  medical attention.     Blood Glucose Monitoring, Adult Monitoring your blood sugar (glucose) helps you manage your diabetes. It also helps you and your health care provider determine how well your diabetes management plan is working. Blood glucose monitoring involves checking your blood glucose as often as directed, and keeping a record (log) of your results over time. Why should I monitor my blood glucose? Checking your blood glucose regularly can:  Help you understand how food, exercise, illnesses, and medicines affect your blood glucose.  Let you know what your blood glucose is at any time. You can quickly tell if you are having low blood glucose (hypoglycemia) or high blood glucose (hyperglycemia).  Help you and your health care provider adjust your medicines as needed. When should I check my blood glucose? Follow instructions from your health care provider about how often to check your blood glucose. This may depend on:  The type of diabetes you have.  How well-controlled your diabetes is.  Medicines you are taking. If you have type 1 diabetes:   Check your blood glucose at least 2 times a day.  Also check your blood glucose:  Before every insulin injection.  Before and after exercise.  Between meals.  2  hours after a meal.  Occasionally between 2:00 a.m. and 3:00 a.m., as directed.  Before potentially dangerous tasks, like driving or using heavy machinery.  At bedtime.  You may need to check your blood glucose more often, up to 6-10 times a day:  If you use an insulin pump.  If you need multiple daily injections (MDI).  If your diabetes is not well-controlled.  If you are ill.  If you have a history of severe hypoglycemia.  If you have a history of not knowing when your blood glucose is getting low (hypoglycemia unawareness). If you have type 2 diabetes:   If you take insulin or other diabetes medicines, check your blood glucose at least 2 times a day.  If you are on intensive insulin therapy, check your blood glucose at least 4 times a day. Occasionally, you may also need to check between 2:00 a.m. and 3:00 a.m., as directed.  Also check your blood glucose:  Before and after exercise.  Before potentially dangerous tasks, like driving or using heavy machinery.  You may need to check your blood glucose more often if:  Your medicine is being adjusted.  Your diabetes is not well-controlled.  You are ill. What is a blood glucose log?  A blood glucose log is a record of your blood glucose readings. It helps you and your health care provider:  Look for patterns in your blood glucose over time.  Adjust your diabetes management plan as needed.  Every time you check your blood glucose, write down your result and notes about things that may be affecting your blood  glucose, such as your diet and exercise for the day.  Most glucose meters store a record of glucose readings in the meter. Some meters allow you to download your records to a computer. How do I check my blood glucose? Follow these steps to get accurate readings of your blood glucose: Supplies needed    Blood glucose meter.  Test strips for your meter. Each meter has its own strips. You must use the strips  that come with your meter.  A needle to prick your finger (lancet). Do not use lancets more than once.  A device that holds the lancet (lancing device).  A journal or log book to write down your results. Procedure   Wash your hands with soap and water.  Prick the side of your finger (not the tip) with the lancet. Use a different finger each time.  Gently rub the finger until a small drop of blood appears.  Follow instructions that come with your meter for inserting the test strip, applying blood to the strip, and using your blood glucose meter.  Write down your result and any notes. Alternative testing sites   Some meters allow you to use areas of your body other than your finger (alternative sites) to test your blood.  If you think you may have hypoglycemia, or if you have hypoglycemia unawareness, do not use alternative sites. Use your finger instead.  Alternative sites may not be as accurate as the fingers, because blood flow is slower in these areas. This means that the result you get may be delayed, and it may be different from the result that you would get from your finger.  The most common alternative sites are:  Forearm.  Thigh.  Palm of the hand. Additional tips   Always keep your supplies with you.  If you have questions or need help, all blood glucose meters have a 24-hour "hotline" number that you can call. You may also contact your health care provider.  After you use a few boxes of test strips, adjust (calibrate) your blood glucose meter by following instructions that came with your meter. This information is not intended to replace advice given to you by your health care provider. Make sure you discuss any questions you have with your health care provider. Document Released: 12/30/2002 Document Revised: 07/17/2015 Document Reviewed: 06/08/2015 Elsevier Interactive Patient Education  2017 [place]. Diabetes Mellitus and Food It is important for you to  manage your blood sugar (glucose) level. Your blood glucose level can be greatly affected by what you eat. Eating healthier foods in the appropriate amounts throughout the day at about the same time each day will help you control your blood glucose level. It can also help slow or prevent worsening of your diabetes mellitus. Healthy eating may even help you improve the level of your blood pressure and reach or maintain a healthy weight. General recommendations for healthful eating and cooking habits include:  Eating meals and snacks regularly. Avoid going long periods of time without eating to lose weight.  Eating a diet that consists mainly of plant-based foods, such as fruits, vegetables, nuts, legumes, and whole grains.  Using low-heat cooking methods, such as baking, instead of high-heat cooking methods, such as deep frying. Work with your dietitian to make sure you understand how to use the Nutrition Facts information on food labels. How can food affect me? Carbohydrates  Carbohydrates affect your blood glucose level more than any other type of food. Your dietitian will help you  determine how many carbohydrates to eat at each meal and teach you how to count carbohydrates. Counting carbohydrates is important to keep your blood glucose at a healthy level, especially if you are using insulin or taking certain medicines for diabetes mellitus. Alcohol  Alcohol can cause sudden decreases in blood glucose (hypoglycemia), especially if you use insulin or take certain medicines for diabetes mellitus. Hypoglycemia can be a life-threatening condition. Symptoms of hypoglycemia (sleepiness, dizziness, and disorientation) are similar to symptoms of having too much alcohol. If your health care provider has given you approval to drink alcohol, do so in moderation and use the following guidelines:  Women should not have more than one drink per day, and men should not have more than two drinks per day. One drink  is equal to:  12 oz of beer.  5 oz of wine.  1 oz of hard liquor.  Do not drink on an empty stomach.  Keep yourself hydrated. Have water, diet soda, or unsweetened iced tea.  Regular soda, juice, and other mixers might contain a lot of carbohydrates and should be counted. What foods are not recommended? As you make food choices, it is important to remember that all foods are not the same. Some foods have fewer nutrients per serving than other foods, even though they might have the same number of calories or carbohydrates. It is difficult to get your body what it needs when you eat foods with fewer nutrients. Examples of foods that you should avoid that are high in calories and carbohydrates but low in nutrients include:  Trans fats (most processed foods list trans fats on the Nutrition Facts label).  Regular soda.  Juice.  Candy.  Sweets, such as cake, pie, doughnuts, and cookies.  Fried foods. What foods can I eat? Eat nutrient-rich foods, which will nourish your body and keep you healthy. The food you should eat also will depend on several factors, including:  The calories you need.  The medicines you take.  Your weight.  Your blood glucose level.  Your blood pressure level.  Your cholesterol level. You should eat a variety of foods, including:  Protein.  Lean cuts of meat.  Proteins low in saturated fats, such as fish, egg whites, and beans. Avoid processed meats.  Fruits and vegetables.  Fruits and vegetables that may help control blood glucose levels, such as apples, mangoes, and yams.  Dairy products.  Choose fat-free or low-fat dairy products, such as milk, yogurt, and cheese.  Grains, bread, pasta, and rice.  Choose whole grain products, such as multigrain bread, whole oats, and brown rice. These foods may help control blood pressure.  Fats.  Foods containing healthful fats, such as nuts, avocado, olive oil, canola oil, and fish. Does everyone  with diabetes mellitus have the same meal plan? Because every person with diabetes mellitus is different, there is not one meal plan that works for everyone. It is very important that you meet with a dietitian who will help you create a meal plan that is just right for you. This information is not intended to replace advice given to you by your health care provider. Make sure you discuss any questions you have with your health care provider. Document Released: 09/23/2004 Document Revised: 06/04/2015 Document Reviewed: 11/23/2012 Elsevier Interactive Patient Education  2017 [place] for Eating Away From Home If You Have Diabetes Controlling your level of blood glucose, also known as blood sugar, can be challenging. It can be even more difficult  when you do not prepare your own meals. The following tips can help you manage your diabetes when you eat away from home. Planning ahead Plan ahead if you know you will be eating away from home:  Ask your health care provider how to time meals and medicine if you are taking insulin.  Make a list of restaurants near you that offer healthy choices. If they have a carry-out menu, take it home and plan what you will order ahead of time.  Look up the restaurant you want to eat at online. Many chain and fast-food restaurants list nutritional information online. Use this information to choose the healthiest options and to calculate how many carbohydrates will be in your meal.  Use a carbohydrate-counting book or mobile app to look up the carbohydrate content and serving size of the foods you want to eat.  Become familiar with serving sizes and learn to recognize how many servings are in a portion. This will allow you to estimate how many carbohydrates you can eat. Free foods A "free food" is any food or drink that has less than 5 g of carbohydrates per serving. Free foods include:  Many vegetables.  Hard boiled eggs.  Nuts or  seeds.  Olives.  Cheeses.  Meats. These types of foods make good appetizer choices and are often available at salad bars. Lemon juice, vinegar, or a low-calorie salad dressing of fewer than 20 calories per serving can be used as a "free" salad dressing. Choices to reduce carbohydrates  Substitute nonfat sweetened yogurt with a sugar-free yogurt. Yogurt made from soy milk may also be used, but you will still want a sugar-free or plain option to choose a lower carbohydrate amount.  Ask your server to take away the bread basket or chips from your table.  Order fresh fruit. A salad bar often offers fresh fruit choices. Avoid canned fruit because it is usually packed in sugar or syrup.  Order a salad, and eat it without dressing. Or, create a "free" salad dressing.  Ask for substitutions. For example, instead of [place] fries, request an order of a vegetable such as salad, green beans, or broccoli. Other tips  If you take insulin, take the insulin once your food arrives to your table. This will ensure your insulin and food are timed correctly.  Ask your server about the portion size before your order, and ask for a take-out box if the portion has more servings than you should have. When your food comes, leave the amount you should have on the plate, and put the rest in the take-out box.  Consider splitting an entree with someone and ordering a side salad. This information is not intended to replace advice given to you by your health care provider. Make sure you discuss any questions you have with your health care provider. Document Released: 12/27/2004 Document Revised: 06/04/2015 Document Reviewed: 03/26/2013 Elsevier Interactive Patient Education  2017 [place].  Type 2 Diabetes Mellitus, Diagnosis, Adult Type 2 diabetes (type 2 diabetes mellitus) is a long-term (chronic) disease. In type 2 diabetes, one or both of these problems may be present:  The pancreas does not make enough of  a hormone called insulin.  Cells in the body do not respond properly to insulin that the body makes (insulin resistance). Normally, insulin allows blood sugar (glucose) to enter cells in the body. The cells use glucose for energy. Insulin resistance or lack of insulin causes excess glucose to build up in the blood instead of  going into cells. As a result, high blood glucose (hyperglycemia) develops. What increases the risk? The following factors may make you more likely to develop type 2 diabetes:  Having a family member with type 2 diabetes.  Being overweight or obese.  Having an inactive (sedentary) lifestyle.  Having been diagnosed with insulin resistance.  Having a history of prediabetes, gestational diabetes, or polycystic ovarian syndrome (PCOS).  Being of American-Indian, African-American, Hispanic/Latino, or Asian/Pacific Islander descent. What are the signs or symptoms? In the early stage of this condition, you may not have symptoms. Symptoms develop slowly and may include:  Increased thirst (polydipsia).  Increased hunger(polyphagia).  Increased urination (polyuria).  Increased urination during the night (nocturia).  Unexplained weight loss.  Frequent infections that keep coming back (recurring).  Fatigue.  Weakness.  Vision changes, such as blurry vision.  Cuts or bruises that are slow to heal.  Tingling or numbness in the hands or feet.  Dark patches on the skin (acanthosis nigricans). How is this diagnosed?   This condition is diagnosed based on your symptoms, your medical history, a physical exam, and your blood glucose level. Your blood glucose may be checked with one or more of the following blood tests:  A fasting blood glucose (FBG) test. You will not be allowed to eat (you will fast) for at least 8 hours before a blood sample is taken.  A random blood glucose test. This checks blood glucose at any time of day regardless of when you ate.  An A1c  (hemoglobin A1c) blood test. This provides information about blood glucose control over the previous 2-3 months.  An oral glucose tolerance test (OGTT). This measures your blood glucose at two times:  After fasting. This is your baseline blood glucose level.  Two hours after drinking a beverage that contains glucose. You may be diagnosed with type 2 diabetes if:  Your FBG level is 126 mg/dL (7.0 mmol/L) or higher.  Your random blood glucose level is 200 mg/dL (11.1 mmol/L) or higher.  Your A1c level is 6.5% or higher.  Your OGGT result is higher than 200 mg/dL (11.1 mmol/L). These blood tests may be repeated to confirm your diagnosis. How is this treated?   Your treatment may be managed by a specialist called an endocrinologist. Type 2 diabetes may be treated by following instructions from your health care provider about:  Making diet and lifestyle changes. This may include:  Following an individualized nutrition plan that is developed by a diet and nutrition specialist (registered dietitian).  Exercising regularly.  Finding ways to manage stress.  Checking your blood glucose level as often as directed.  Taking diabetes medicines or insulin daily. This helps to keep your blood glucose levels in the healthy range.  If you use insulin, you may need to adjust the dosage depending on how physically active you are and what foods you eat. Your health care provider will tell you how to adjust your dosage.  Taking medicines to help prevent complications from diabetes, such as:  Aspirin.  Medicine to lower cholesterol.  Medicine to control blood pressure. Your health care provider will set individualized treatment goals for you. Your goals will be based on your age, other medical conditions you have, and how you respond to diabetes treatment. Generally, the goal of treatment is to maintain the following blood glucose levels:  Before meals (preprandial): 80-130 mg/dL (4.4-7.2  mmol/L).  After meals (postprandial): below 180 mg/dL (10 mmol/L).  A1c level: less than 7%. Follow these  instructions at home: Questions to [place] Provider  Consider asking the following questions:  Do I need to meet with a diabetes educator?  Where can I find a support group for people with diabetes?  What equipment will I need to manage my diabetes at home?  What diabetes medicines do I need, and when should I take them?  How often do I need to check my blood glucose?  What number can I call if I have questions?  When is my next appointment? General instructions   Take over-the-counter and prescription medicines only as told by your health care provider.  Keep all follow-up visits as told by your health care provider. This is important.  For more information about diabetes, visit:  American Diabetes Association (ADA): www.diabetes.org  American Association of Diabetes Educators (AADE): www.diabeteseducator.org/patient-resources Contact a health care provider if:  Your blood glucose is at or above 240 mg/dL (13.3 mmol/L) for 2 days in a row.  You have been sick or have had a fever for 2 days or longer and you are not getting better.  You have any of the following problems for more than 6 hours:  You cannot eat or drink.  You have nausea and vomiting.  You have diarrhea. Get help right away if:  Your blood glucose is lower than 54 mg/dL (3.0 mmol/L).  You become confused or you have trouble thinking clearly.  You have difficulty breathing.  You have moderate or large ketone levels in your urine. This information is not intended to replace advice given to you by your health care provider. Make sure you discuss any questions you have with your health care provider. Document Released: 12/27/2004 Document Revised: 06/04/2015 Document Reviewed: 01/30/2015 Elsevier Interactive Patient Education  2017 [employer].

## 2016-07-11 ENCOUNTER — Telehealth: Payer: Self-pay | Admitting: Nurse Practitioner

## 2016-07-11 NOTE — Telephone Encounter (Signed)
Left message regarding upcoming appointment has been canceled and needs to be rescheduled. °

## 2016-07-29 ENCOUNTER — Ambulatory Visit: Payer: BC Managed Care – PPO | Admitting: Nurse Practitioner

## 2016-08-01 ENCOUNTER — Ambulatory Visit: Payer: BC Managed Care – PPO | Admitting: Nurse Practitioner

## 2016-08-03 ENCOUNTER — Encounter: Payer: Self-pay | Admitting: Obstetrics and Gynecology

## 2016-08-03 ENCOUNTER — Ambulatory Visit (INDEPENDENT_AMBULATORY_CARE_PROVIDER_SITE_OTHER): Payer: Medicare Other | Admitting: Obstetrics and Gynecology

## 2016-08-03 ENCOUNTER — Other Ambulatory Visit (HOSPITAL_COMMUNITY)
Admission: RE | Admit: 2016-08-03 | Discharge: 2016-08-03 | Disposition: A | Discharge status: Home / Self Care | Payer: Medicare Other | Source: Ambulatory Visit | Attending: Obstetrics and Gynecology | Admitting: Obstetrics and Gynecology

## 2016-08-03 VITALS — BP 142/78 | HR 84 | Resp 16 | Ht 61.25 in | Wt 133.0 lb

## 2016-08-03 DIAGNOSIS — M810 Age-related osteoporosis without current pathological fracture: Secondary | ICD-10-CM

## 2016-08-03 DIAGNOSIS — R3915 Urgency of urination: Secondary | ICD-10-CM

## 2016-08-03 DIAGNOSIS — Z124 Encounter for screening for malignant neoplasm of cervix: Secondary | ICD-10-CM | POA: Diagnosis present

## 2016-08-03 DIAGNOSIS — Z01419 Encounter for gynecological examination (general) (routine) without abnormal findings: Secondary | ICD-10-CM | POA: Diagnosis not present

## 2016-08-03 LAB — POCT URINALYSIS DIPSTICK
Bilirubin, UA: NEGATIVE
Glucose, UA: NEGATIVE
Ketones, UA: NEGATIVE
Nitrite, UA: NEGATIVE
PH UA: 5 (ref 5.0–8.0)
PROTEIN UA: NEGATIVE
UROBILINOGEN UA: 0.2 U/dL

## 2016-08-03 NOTE — Patient Instructions (Signed)
EXERCISE AND DIET:  We recommended that you start or continue a regular exercise program for good health. Regular exercise means any activity that makes your heart beat faster and makes you sweat.  We recommend exercising at least 30 minutes per day at least 3 days a week, preferably 4 or 5.  We also recommend a diet low in fat and sugar.  Inactivity, poor dietary choices and obesity can cause diabetes, heart attack, stroke, and kidney damage, among others.    ALCOHOL AND SMOKING:  Women should limit their alcohol intake to no more than 7 drinks/beers/glasses of wine (combined, not each!) per week. Moderation of alcohol intake to this level decreases your risk of breast cancer and liver damage. And of course, no recreational drugs are part of a healthy lifestyle.  And absolutely no smoking or even second hand smoke. Most people know smoking can cause heart and lung diseases, but did you know it also contributes to weakening of your bones? Aging of your skin?  Yellowing of your teeth and nails?  CALCIUM AND VITAMIN D:  Adequate intake of calcium and Vitamin D are recommended.  The recommendations for exact amounts of these supplements seem to change often, but generally speaking 600 mg of calcium (either carbonate or citrate) and 800 units of Vitamin D per day seems prudent. Certain women may benefit from higher intake of Vitamin D.  If you are among these women, your doctor will have told you during your visit.    PAP SMEARS:  Pap smears, to check for cervical cancer or precancers,  have traditionally been done yearly, although recent scientific advances have shown that most women can have pap smears less often.  However, every woman still should have a physical exam from her gynecologist every year. It will include a breast check, inspection of the vulva and vagina to check for abnormal growths or skin changes, a visual exam of the cervix, and then an exam to evaluate the size and shape of the uterus and  ovaries.  And after 66 years of age, a rectal exam is indicated to check for rectal cancers. We will also provide age appropriate advice regarding health maintenance, like when you should have certain vaccines, screening for sexually transmitted diseases, bone density testing, colonoscopy, mammograms, etc.   MAMMOGRAMS:  All women over 52 years old should have a yearly mammogram. Many facilities now offer a "3D" mammogram, which may cost around $50 extra out of pocket. If possible,  we recommend you accept the option to have the 3D mammogram performed.  It both reduces the number of women who will be called back for extra views which then turn out to be normal, and it is better than the routine mammogram at detecting truly abnormal areas.    COLONOSCOPY:  Colonoscopy to screen for colon cancer is recommended for all women at age 18.  We know, you hate the idea of the prep.  We agree, BUT, having colon cancer and not knowing it is worse!!  Colon cancer so often starts as a polyp that can be seen and removed at colonscopy, which can quite literally save your life!  And if your first colonoscopy is normal and you have no family history of colon cancer, most women don't have to have it again for 10 years.  Once every ten years, you can do something that may end up saving your life, right?  We will be happy to help you get it scheduled when you are ready.  Be sure to check your insurance coverage so you understand how much it will cost.  It may be covered as a preventative service at no cost, but you should check your particular policy.     Denosumab injection What is this medicine? DENOSUMAB (den oh sue mab) slows bone breakdown. Prolia is used to treat osteoporosis in women after menopause and in men. Xgeva is used to treat a high calcium level due to cancer and to prevent bone fractures and other bone problems caused by multiple myeloma or cancer bone metastases. Xgeva is also used to treat giant cell tumor  of the bone. This medicine may be used for other purposes; ask your health care provider or pharmacist if you have questions. COMMON BRAND NAME(S): Prolia, XGEVA What should I tell my health care provider before I take this medicine? They need to know if you have any of these conditions: -dental disease -having surgery or tooth extraction -infection -kidney disease -low levels of calcium or Vitamin D in the blood -malnutrition -on hemodialysis -skin conditions or sensitivity -thyroid or parathyroid disease -an unusual reaction to denosumab, other medicines, foods, dyes, or preservatives -pregnant or trying to get pregnant -breast-feeding How should I use this medicine? This medicine is for injection under the skin. It is given by a health care professional in a hospital or clinic setting. If you are getting Prolia, a special MedGuide will be given to you by the pharmacist with each prescription and refill. Be sure to read this information carefully each time. For Prolia, talk to your pediatrician regarding the use of this medicine in children. Special care may be needed. For Xgeva, talk to your pediatrician regarding the use of this medicine in children. While this drug may be prescribed for children as young as 13 years for selected conditions, precautions do apply. Overdosage: If you think you have taken too much of this medicine contact a poison control center or emergency room at once. NOTE: This medicine is only for you. Do not share this medicine with others. What if I miss a dose? It is important not to miss your dose. Call your doctor or health care professional if you are unable to keep an appointment. What may interact with this medicine? Do not take this medicine with any of the following medications: -other medicines containing denosumab This medicine may also interact with the following medications: -medicines that lower your chance of fighting infection -steroid medicines  like prednisone or cortisone This list may not describe all possible interactions. Give your health care provider a list of all the medicines, herbs, non-prescription drugs, or dietary supplements you use. Also tell them if you smoke, drink alcohol, or use illegal drugs. Some items may interact with your medicine. What should I watch for while using this medicine? Visit your doctor or health care professional for regular checks on your progress. Your doctor or health care professional may order blood tests and other tests to see how you are doing. Call your doctor or health care professional for advice if you get a fever, chills or sore throat, or other symptoms of a cold or flu. Do not treat yourself. This drug may decrease your body's ability to fight infection. Try to avoid being around people who are sick. You should make sure you get enough calcium and vitamin D while you are taking this medicine, unless your doctor tells you not to. Discuss the foods you eat and the vitamins you take with your health care professional. See   your dentist regularly. Brush and floss your teeth as directed. Before you have any dental work done, tell your dentist you are receiving this medicine. Do not become pregnant while taking this medicine or for 5 months after stopping it. Talk with your doctor or health care professional about your birth control options while taking this medicine. Women should inform their doctor if they wish to become pregnant or think they might be pregnant. There is a potential for serious side effects to an unborn child. Talk to your health care professional or pharmacist for more information. What side effects may I notice from receiving this medicine? Side effects that you should report to your doctor or health care professional as soon as possible: -allergic reactions like skin rash, itching or hives, swelling of the face, lips, or tongue -bone pain -breathing problems -dizziness -jaw  pain, especially after dental work -redness, blistering, peeling of the skin -signs and symptoms of infection like fever or chills; cough; sore throat; pain or trouble passing urine -signs of low calcium like fast heartbeat, muscle cramps or muscle pain; pain, tingling, numbness in the hands or feet; seizures -unusual bleeding or bruising -unusually weak or tired Side effects that usually do not require medical attention (report to your doctor or health care professional if they continue or are bothersome): -constipation -diarrhea -headache -joint pain -loss of appetite -muscle pain -runny nose -tiredness -upset stomach This list may not describe all possible side effects. Call your doctor for medical advice about side effects. You may report side effects to FDA at 1-800-FDA-1088. Where should I keep my medicine? This medicine is only given in a clinic, doctor's office, or other health care setting and will not be stored at home. NOTE: This sheet is a summary. It may not cover all possible information. If you have questions about this medicine, talk to your doctor, pharmacist, or health care provider.  2018 Elsevier/Gold Standard (2016-01-19 19:17:21)

## 2016-08-03 NOTE — Progress Notes (Signed)
66 y.o. G9P1001 Married Caucasian female here for annual exam. Patient complains of having urgency to urinate. Urine dipstick done and resulted.Trace Leuks, and trace blood.  Some frequency.  Has mostly urgency.  No leakage with cough or sneeze.  One coffee per day. One ice coffee per day.  No sodas.  Does drink seltzer water.  Like lemon in her water.   Started Metformin.  Hgb A1C 7.4 on 05/24/16.  Lost 10 pounds or more.  States FH of diabetes.  Has follow up in August with Dr. Raliegh Ip.   States she has white coat syndrome and BP elevated at office visits.  Bp at home is 130/---. No CP or SOB.  Osteoporosis.  On Evista.  Helps to keep two grandchildren.   PCP:  Lossie Faes, MD  Patient's last menstrual period was 01/10/1998.           Sexually active: No.  The current method of family planning is post menopausal status.    Exercising: Yes.    walking Smoker:  no  Health Maintenance: Pap:  07/24/13 wnl neg HR HPV History of abnormal Pap:  no MMG:  10/14/15 BIRADS 1 negative/density a Colonoscopy:  06/24/14 benign f/u in 2019 BMD: 09/17/14    Result  Osteoporosis TDaP:  2015 HIV: done with Dr. Nori Riis during pregnancy Hep C: done with Dr. Nori Riis during pregnancy Screening Labs: PCP takes care of labs  Urine: trace WBC; trace RBC   reports that she has never smoked. She has never used smokeless tobacco. She reports that she drinks alcohol. She reports that she does not use drugs.  Past Medical History:  Diagnosis Date  . Adenomatous colon polyp   . Arthritis    fingers  . Blood transfusion without reported diagnosis 1987   after childbirth  . Cataract   . Diabetes mellitus without complication ([place])   . Endometriosis   . GERD (gastroesophageal reflux disease)   . IBS (irritable bowel syndrome)   . Personal history of colonic polyps 06/16/2003   hyperplastic  . PONV (postoperative nausea and vomiting)   . VASOMOTOR RHINITIS     Past Surgical History:  Procedure  Laterality Date  . BREAST BIOPSY Right 1990  . COLONOSCOPY N/A 06/24/2014   Procedure: COLONOSCOPY;  Surgeon: Jerene Bears, MD;  Location: [facility];  Service: Gastroenterology;  Laterality: N/A;  . COLONOSCOPY W/ POLYPECTOMY    . HOT HEMOSTASIS N/A 06/24/2014   Procedure: HOT HEMOSTASIS (ARGON PLASMA COAGULATION/BICAP);  Surgeon: Jerene Bears, MD;  Location: [facility];  Service: Gastroenterology;  Laterality: N/A;  . [place]  . MAXILLARY CYST EXCISION Left 1968  . TONSILLECTOMY  1957    Current Outpatient Prescriptions  Medication Sig Dispense Refill  . Calcium Carbonate-Vit D-Min (CALCIUM 1200 PO) Take by mouth daily.    . Cholecalciferol (VITAMIN D) 2000 UNITS tablet Take 2,000 Units by mouth daily.    . fluticasone (FLONASE) 50 MCG/ACT nasal spray Place 2 sprays into the nose daily. (Patient taking differently: Place 2 sprays into the nose as needed. ) 16 g 6  . ibuprofen (ADVIL,MOTRIN) 200 MG tablet Take 200 mg by mouth every 6 (six) hours as needed for mild pain.    . metFORMIN (GLUCOPHAGE-XR) 500 MG 24 hr tablet Take 2 tablets (1,000 mg total) by mouth daily with breakfast. 180 tablet 0  . Multiple Vitamin (MULTIVITAMIN) capsule Take 1 capsule by mouth daily.      Marland Kitchen  pantoprazole (PROTONIX) 40 MG tablet Take 1 tablet (40 mg total) by mouth daily. 30 tablet 10  . raloxifene (EVISTA) 60 MG tablet Take 1 tablet (60 mg total) by mouth daily. 30 tablet 5   No current facility-administered medications for this visit.     Family History  Problem Relation Age of Onset  . Lung cancer Mother 50       non smoker  . Bladder Cancer Father 58       bladder that spread to pancreas  . Heart attack Father   . Diabetes Father   . Colon polyps Father   . Prostate cancer Brother   . Diabetes Paternal Grandfather   . Heart disease Maternal Grandmother   . Hypertension Maternal Grandmother   . Osteoporosis Maternal Grandmother    . Heart disease Paternal Grandmother   . Hypertension Paternal Grandmother   . Colon cancer Neg Hx     ROS:  Pertinent items are noted in HPI.  Otherwise, a comprehensive ROS was negative.  Exam:   BP (!) 142/78 (BP Location: Right Arm, Patient Position: Sitting, Cuff Size: Normal)   Pulse 84   Resp 16   Ht 5' 1.25" (1.556 m)   Wt 133 lb (60.3 kg)   LMP 01/10/1998   BMI 24.93 kg/m     General appearance: alert, cooperative and appears stated age Head: Normocephalic, without obvious abnormality, atraumatic Neck: no adenopathy, supple, symmetrical, trachea midline and thyroid normal to inspection and palpation Lungs: clear to auscultation bilaterally Breasts: normal appearance, no masses or tenderness, No nipple retraction or dimpling, No nipple discharge or bleeding, No axillary or supraclavicular adenopathy Heart: regular rate and rhythm Abdomen: soft, non-tender; no masses, no organomegaly Extremities: extremities normal, atraumatic, no cyanosis or edema Skin: Skin color, texture, turgor normal. No rashes or lesions Lymph nodes: Cervical, supraclavicular, and axillary nodes normal. No abnormal inguinal nodes palpated Neurologic: Grossly normal  Pelvic: External genitalia:  no lesions              Urethra:  normal appearing urethra with no masses, tenderness or lesions              Bartholins and Skenes: normal                 Vagina: normal appearing vagina with normal color and discharge, no lesions              Cervix: no lesions              Pap taken: Yes.   Bimanual Exam:  Uterus:  normal size, contour, position, consistency, mobility, non-tender              Adnexa: no mass, fullness, tenderness              Rectal exam: Yes.  .  Confirms.              Anus:  normal sphincter tone, no lesions  Chaperone was present for exam.  Assessment:   Well woman visit with normal exam. Hx DM.  Osteoporosis.  On Evista.  Hx reflux with globus symptoms.  DM.  White coat  syndrome. Over active bladder.   Plan: Mammogram screening discussed. Recommended self breast awareness. Pap and HR HPV as above. Guidelines for Calcium, Vitamin D, regular exercise program including cardiovascular and weight bearing exercise. Stop Evista due to cardiovascular risk factors. Will do BMD and mammogram.  Consider Prolia. Discussed with patient and written information given.  Avoid  bladder irritants and have good glucose control.  I did mention medication for OAB but will try lifestyle changes and good sugar control first.  Urine micro and culture.  Follow up annually and prn.   After visit summary provided.

## 2016-08-04 LAB — URINALYSIS, MICROSCOPIC ONLY
BACTERIA UA: NONE SEEN
Casts: NONE SEEN /lpf

## 2016-08-04 LAB — URINE CULTURE

## 2016-08-05 LAB — CYTOLOGY - PAP: DIAGNOSIS: NEGATIVE

## 2016-08-10 ENCOUNTER — Other Ambulatory Visit: Payer: Self-pay | Admitting: Internal Medicine

## 2016-08-24 ENCOUNTER — Ambulatory Visit (INDEPENDENT_AMBULATORY_CARE_PROVIDER_SITE_OTHER): Payer: Medicare Other | Admitting: Internal Medicine

## 2016-08-24 ENCOUNTER — Encounter: Payer: Self-pay | Admitting: Internal Medicine

## 2016-08-24 VITALS — BP 138/84 | HR 80 | Temp 98.0°F | Ht 61.0 in | Wt 132.6 lb

## 2016-08-24 DIAGNOSIS — M8000XA Age-related osteoporosis with current pathological fracture, unspecified site, initial encounter for fracture: Secondary | ICD-10-CM

## 2016-08-24 DIAGNOSIS — E119 Type 2 diabetes mellitus without complications: Secondary | ICD-10-CM | POA: Diagnosis not present

## 2016-08-24 DIAGNOSIS — K219 Gastro-esophageal reflux disease without esophagitis: Secondary | ICD-10-CM

## 2016-08-24 DIAGNOSIS — M810 Age-related osteoporosis without current pathological fracture: Secondary | ICD-10-CM | POA: Insufficient documentation

## 2016-08-24 LAB — POCT GLYCOSYLATED HEMOGLOBIN (HGB A1C): HEMOGLOBIN A1C: 6.2

## 2016-08-24 NOTE — Progress Notes (Signed)
Subjective:    Patient ID: Stephanie Grimes, female    DOB: [birth year], 66 y.o.   MRN: [id number]  HPI  66 year old patient who has type 2 diabetes.  Presently she is on metformin 1000 mg daily in the morning. Feels quite well. She does have a history of osteoporosis, which has been treated with Evista.  This has been discontinued due to cardiovascular concerns.  She is scheduled for a follow-up bone density study in 2 months.  She is followed closely by gynecology  She is on a diet and has lost 10 pounds in weight.  Feels well  Past Medical History:  Diagnosis Date  . Adenomatous colon polyp   . Arthritis    fingers  . Blood transfusion without reported diagnosis 1987   after childbirth  . Cataract   . Diabetes mellitus without complication ([place])   . Endometriosis   . GERD (gastroesophageal reflux disease)   . IBS (irritable bowel syndrome)   . Personal history of colonic polyps 06/16/2003   hyperplastic  . PONV (postoperative nausea and vomiting)   . VASOMOTOR RHINITIS      Social History   Social History  . Marital status: Married    Spouse name: N/A  . Number of children: 1  . Years of education: N/A   Occupational History  . retired Education officer, [employer]    Social History Main Topics  . Smoking status: Never Smoker  . Smokeless tobacco: Never Used  . Alcohol use 0.0 oz/week     Comment: rarely  . Drug use: No  . Sexual activity: Not Currently    Partners: Male    Birth control/ protection: Post-menopausal   Other Topics Concern  . Not on file   Social History Narrative  . No narrative on file    Past Surgical History:  Procedure Laterality Date  . BREAST BIOPSY Right 1990  . COLONOSCOPY N/A 06/24/2014   Procedure: COLONOSCOPY;  Surgeon: Jerene Bears, MD;  Location: [facility];  Service: Gastroenterology;  Laterality: N/A;  . COLONOSCOPY W/ POLYPECTOMY    . HOT HEMOSTASIS N/A 06/24/2014   Procedure: HOT HEMOSTASIS (ARGON PLASMA COAGULATION/BICAP);  Surgeon:  Jerene Bears, MD;  Location: [facility];  Service: Gastroenterology;  Laterality: N/A;  . [place]  . MAXILLARY CYST EXCISION Left 1968  . TONSILLECTOMY  1957    Family History  Problem Relation Age of Onset  . Lung cancer Mother 51       non smoker  . Bladder Cancer Father 91       bladder that spread to pancreas  . Heart attack Father   . Diabetes Father   . Colon polyps Father   . Prostate cancer Brother   . Diabetes Paternal Grandfather   . Heart disease Maternal Grandmother   . Hypertension Maternal Grandmother   . Osteoporosis Maternal Grandmother   . Heart disease Paternal Grandmother   . Hypertension Paternal Grandmother   . Colon cancer Neg Hx     Allergies  Allergen Reactions  . Omeprazole     diarrhea  . Sulfa Antibiotics Swelling    Current Outpatient Prescriptions on File Prior to Visit  Medication Sig Dispense Refill  . Calcium Carbonate-Vit D-Min (CALCIUM 1200 PO) Take by mouth daily.    . Cholecalciferol (VITAMIN D) 2000 UNITS tablet Take 2,000 Units by mouth daily.    . fluticasone (FLONASE) 50 MCG/ACT nasal spray Place 2 sprays into  the nose daily. (Patient taking differently: Place 2 sprays into the nose as needed. ) 16 g 6  . ibuprofen (ADVIL,MOTRIN) 200 MG tablet Take 200 mg by mouth every 6 (six) hours as needed for mild pain.    . metFORMIN (GLUCOPHAGE-XR) 500 MG 24 hr tablet TAKE 2 TABLETS(1000 MG) BY MOUTH DAILY WITH BREAKFAST 180 tablet 0  . Multiple Vitamin (MULTIVITAMIN) capsule Take 1 capsule by mouth daily.      . pantoprazole (PROTONIX) 40 MG tablet Take 1 tablet (40 mg total) by mouth daily. 30 tablet 10   No current facility-administered medications on file prior to visit.     BP 138/84 (BP Location: Left Arm, Patient Position: Sitting, Cuff Size: Normal)   Pulse 80   Temp 98 F (36.7 C) (Oral)   Ht 5\' 1"  (1.549 m)   Wt 132 lb 9.6 oz (60.1 kg)   LMP 01/10/1998   SpO2 97%    BMI 25.05 kg/m     Review of Systems  Constitutional: Negative.   HENT: Negative for congestion, dental problem, hearing loss, rhinorrhea, sinus pressure, sore throat and tinnitus.   Eyes: Negative for pain, discharge and visual disturbance.  Respiratory: Negative for cough and shortness of breath.   Cardiovascular: Negative for chest pain, palpitations and leg swelling.  Gastrointestinal: Negative for abdominal distention, abdominal pain, blood in stool, constipation, diarrhea, nausea and vomiting.  Genitourinary: Negative for difficulty urinating, dysuria, flank pain, frequency, hematuria, pelvic pain, urgency, vaginal bleeding, vaginal discharge and vaginal pain.  Musculoskeletal: Negative for arthralgias, gait problem and joint swelling.  Skin: Negative for rash.  Neurological: Negative for dizziness, syncope, speech difficulty, weakness, numbness and headaches.  Hematological: Negative for adenopathy.  Psychiatric/Behavioral: Negative for agitation, behavioral problems and dysphoric mood. The patient is not nervous/anxious.        Objective:   Physical Exam  Constitutional: She is oriented to person, place, and time. She appears well-developed and well-nourished.  HENT:  Head: Normocephalic.  Right Ear: External ear normal.  Left Ear: External ear normal.  Mouth/Throat: Oropharynx is clear and moist.  Eyes: Pupils are equal, round, and reactive to light. Conjunctivae and EOM are normal.  Neck: Normal range of motion. Neck supple. No thyromegaly present.  Cardiovascular: Normal rate, regular rhythm, normal heart sounds and intact distal pulses.   Pulmonary/Chest: Effort normal and breath sounds normal.  Abdominal: Soft. Bowel sounds are normal. She exhibits no mass. There is no tenderness.  Musculoskeletal: Normal range of motion.  Lymphadenopathy:    She has no cervical adenopathy.  Neurological: She is alert and oriented to person, place, and time.  Skin: Skin is warm  and dry. No rash noted.  Psychiatric: She has a normal mood and affect. Her behavior is normal.          Assessment & Plan:   Diabetes mellitus.  Will review hemoglobin A1c Osteoporosis.  Follow-up bone density study scheduled GERD stable.  Continue PPI therapy  Nyoka Cowden

## 2016-08-24 NOTE — Patient Instructions (Signed)
Please check your hemoglobin A1c every 3 months    It is important that you exercise regularly, at least 20 minutes 3 to 4 times per week.  If you develop chest pain or shortness of breath seek  medical attention.  Take a calcium supplement, plus [phone] units of vitamin D

## 2016-09-19 ENCOUNTER — Other Ambulatory Visit: Payer: Self-pay | Admitting: Internal Medicine

## 2016-09-29 ENCOUNTER — Encounter: Payer: Self-pay | Admitting: Internal Medicine

## 2016-10-11 ENCOUNTER — Telehealth: Payer: Self-pay | Admitting: Obstetrics and Gynecology

## 2016-10-11 NOTE — Telephone Encounter (Signed)
Patient states bone density order was sent o [facility]. Would like order sent to [facility]. Please advise.

## 2016-10-11 NOTE — Telephone Encounter (Signed)
Spoke with patient, advised new order can be sent to [facility] for BMD. Will have Dr. Quincy Simmonds sign order when she returns to the office on 10/3 and fax to [facility]. Advised patient will return call to notify once this is done. Patient thankful and verbalizes understanding.  Dr. Quincy Simmonds -order placed on desk for signature.

## 2016-10-12 NOTE — Telephone Encounter (Signed)
Signed BMD order faxed to [facility], patient notified. Patient aware to f/u with [facility] for scheduling. Will close encounter.

## 2016-10-12 NOTE — Telephone Encounter (Signed)
I signed the order this morning.  Thanks.

## 2016-10-23 ENCOUNTER — Telehealth: Payer: Self-pay | Admitting: Obstetrics and Gynecology

## 2016-10-23 NOTE — Telephone Encounter (Signed)
Please contact patient regarding her BMD from 10/18/16.  She has osteoporosis of the right hip.  (This was osteopenia in 2016.) She has osteopenia of the left hip (This is where she had osteoporosis in 2016.) Her spine showed normal bone density both now in 2018 and in 2016.  I would recommend Prolia, which we discussed at her last office visit.  I also gave her written material at that time. This would need to be precerted.

## 2016-10-24 NOTE — Telephone Encounter (Signed)
Spoke with patient. Advised of results as seen below from [place]. Patient verbalizes understanding. Would like to start on Prolia at this time. Advised this will need to be authorized with her insurance company. Will be in contact with her once this has been completed. If approved the specialty pharmacy will contact the patient to discuss cost and shipping. Patient is agreeable.  Routing to [employer]  for [employer].

## 2016-10-26 MED ORDER — DENOSUMAB 60 MG/ML ~~LOC~~ SOLN: 60.0000 mg | SUBCUTANEOUS | 1 refills | Status: DC

## 2016-10-26 NOTE — Telephone Encounter (Signed)
Spoke with [place] [facility] [employer] and no prior authorization is needed for codes [phone], [id number] and [phone] with diagnosis code of M81.01. [facility] is the specialty pharmacy [phone]. Per representative patient can get this at her local pharmacy.

## 2016-10-26 NOTE — Telephone Encounter (Signed)
Spoke with Cherlyn Cushing at [facility] who states Prolia will need to be ordered through [facility] for delivery. Rx for Prolia 60 mg/ml inject into the skin every 6 months in upper arm, thigh, or abdomen #1 1RF sent to [facility].  Left message for patient to call [facility] at [phone].

## 2016-10-28 NOTE — Telephone Encounter (Signed)
Spoke with Stephanie Grimes at [facility]. Patient has provided consent for shipment. Authorized shipment to the office for 11/01/2016. Spoke with patient who is agreeable. First Prolia injection scheduled for 11/02/2016 at 8:45 am. Patient is agreeable to date and time.  Routing to provider for final review. Patient agreeable to disposition. Will close encounter.

## 2016-11-01 ENCOUNTER — Encounter: Payer: Self-pay | Admitting: Obstetrics and Gynecology

## 2016-11-01 DIAGNOSIS — M810 Age-related osteoporosis without current pathological fracture: Secondary | ICD-10-CM

## 2016-11-02 ENCOUNTER — Ambulatory Visit (INDEPENDENT_AMBULATORY_CARE_PROVIDER_SITE_OTHER): Payer: Medicare Other

## 2016-11-02 VITALS — BP 128/70 | HR 68 | Resp 16 | Wt 133.0 lb

## 2016-11-02 DIAGNOSIS — M810 Age-related osteoporosis without current pathological fracture: Secondary | ICD-10-CM | POA: Diagnosis not present

## 2016-11-02 MED ORDER — DENOSUMAB 60 MG/ML ~~LOC~~ SOLN
60.0000 mg | Freq: Once | SUBCUTANEOUS | Status: AC
  Administered 2016-11-02: 60 mg via SUBCUTANEOUS

## 2016-11-02 NOTE — Progress Notes (Signed)
Patient in office for first Prolia injection. Injection administered in Left Arm and was tolerated well.  Routing to provider for final review. Patient agreeable to disposition. Will close encounter.

## 2016-11-07 ENCOUNTER — Other Ambulatory Visit: Payer: Self-pay | Admitting: Internal Medicine

## 2016-11-10 ENCOUNTER — Telehealth: Payer: Self-pay | Admitting: Internal Medicine

## 2016-11-10 MED ORDER — PANTOPRAZOLE SODIUM 40 MG PO TBEC: DELAYED_RELEASE_TABLET | ORAL | 1 refills | Status: DC

## 2016-11-10 NOTE — Telephone Encounter (Signed)
Rx sent 

## 2016-11-24 ENCOUNTER — Encounter: Payer: Self-pay | Admitting: Internal Medicine

## 2016-11-24 ENCOUNTER — Ambulatory Visit: Payer: Medicare Other | Admitting: Internal Medicine

## 2016-11-24 VITALS — BP 142/72 | HR 81 | Temp 98.2°F | Ht 61.0 in | Wt 130.8 lb

## 2016-11-24 DIAGNOSIS — Z23 Encounter for immunization: Secondary | ICD-10-CM | POA: Diagnosis not present

## 2016-11-24 DIAGNOSIS — Z86010 Personal history of colonic polyps: Secondary | ICD-10-CM | POA: Diagnosis not present

## 2016-11-24 DIAGNOSIS — M818 Other osteoporosis without current pathological fracture: Secondary | ICD-10-CM

## 2016-11-24 DIAGNOSIS — E119 Type 2 diabetes mellitus without complications: Secondary | ICD-10-CM

## 2016-11-24 LAB — POCT GLYCOSYLATED HEMOGLOBIN (HGB A1C): HEMOGLOBIN A1C: 6.1

## 2016-11-24 NOTE — Progress Notes (Signed)
Subjective:    Patient ID: Stephanie Grimes, female    DOB: 09-27-1950, 66 y.o.   MRN: [id number]  HPI Lab Results  Component Value Date   HGBA1C 6.2 08/24/2016   66 year old patient seen today for her six-month follow-up.  She has a history of type 2 diabetes controlled on metformin therapy.  Hemoglobin A1c's have been very well controlled. She is scheduled to see GI next year.  She remains on PPI therapy for possible reflux related cough.  She anticipates a trial off PPI therapy after GI follow-up.  She is also scheduled for follow-up colonoscopy in about 7 months She is followed by gynecology and now is on Prolia for osteoporosis.  She  received her first injection last month Doing well without concerns or complaints  Past Medical History:  Diagnosis Date  . Adenomatous colon polyp   . Arthritis    fingers  . Blood transfusion without reported diagnosis 1987   after childbirth  . Cataract   . Diabetes mellitus without complication ([place])   . Endometriosis   . GERD (gastroesophageal reflux disease)   . IBS (irritable bowel syndrome)   . Personal history of colonic polyps 06/16/2003   hyperplastic  . PONV (postoperative nausea and vomiting)   . VASOMOTOR RHINITIS      Social History   Socioeconomic History  . Marital status: Married    Spouse name: Not on file  . Number of children: 1  . Years of education: Not on file  . Highest education level: Not on file  Social Needs  . Financial resource strain: Not on file  . Food insecurity - worry: Not on file  . Food insecurity - inability: Not on file  . Transportation needs - medical: Not on file  . Transportation needs - non-medical: Not on file  Occupational History  . Occupation: retired Education officer, museum  Tobacco Use  . Smoking status: Never Smoker  . Smokeless tobacco: Never Used  Substance and Sexual Activity  . Alcohol use: Yes    Alcohol/week: 0.0 oz    Comment: rarely  . Drug use: No  . Sexual activity: Not  Currently    Partners: Male    Birth control/protection: Post-menopausal  Other Topics Concern  . Not on file  Social History Narrative  . Not on file    Past Surgical History:  Procedure Laterality Date  . BREAST BIOPSY Right 1990  . COLONOSCOPY N/A 06/24/2014   Procedure: COLONOSCOPY;  Surgeon: Jerene Bears, MD;  Location: [facility];  Service: Gastroenterology;  Laterality: N/A;  . COLONOSCOPY W/ POLYPECTOMY    . HOT HEMOSTASIS N/A 06/24/2014   Procedure: HOT HEMOSTASIS (ARGON PLASMA COAGULATION/BICAP);  Surgeon: Jerene Bears, MD;  Location: [facility];  Service: Gastroenterology;  Laterality: N/A;  . [place]  . MAXILLARY CYST EXCISION Left 1968  . TONSILLECTOMY  1957    Family History  Problem Relation Age of Onset  . Lung cancer Mother 73       non smoker  . Bladder Cancer Father 23       bladder that spread to pancreas  . Heart attack Father   . Diabetes Father   . Colon polyps Father   . Prostate cancer Brother   . Diabetes Paternal Grandfather   . Heart disease Maternal Grandmother   . Hypertension Maternal Grandmother   . Osteoporosis Maternal Grandmother   . Heart disease Paternal Grandmother   .  Hypertension Paternal Grandmother   . Colon cancer Neg Hx     Allergies  Allergen Reactions  . Omeprazole     diarrhea  . Sulfa Antibiotics Swelling    Current Outpatient Medications on File Prior to Visit  Medication Sig Dispense Refill  . Calcium Carbonate-Vit D-Min (CALCIUM 1200 PO) Take by mouth daily.    Mar[facility] denosumab (PROLIA) 60 MG/ML SOLN injection Inject 60 mg into the skin every 6 (six) months. Administer in upper arm, thigh, or abdomen 1 mL 1  . fluticasone (FLONASE) 50 MCG/ACT nasal spray Place 2 sprays into the nose daily. (Patient taking differently: Place 2 sprays into the nose as needed. ) 16 g 6  . ibuprofen (ADVIL,MOTRIN) 200 MG tablet Take 200 mg by mouth every 6 (six) hours as  needed for mild pain.    . metFORMIN (GLUCOPHAGE-XR) 500 MG 24 hr tablet TAKE 2 TABLETS(1000 MG) BY MOUTH DAILY WITH BREAKFAST 180 tablet 0  . Multiple Vitamin (MULTIVITAMIN) capsule Take 1 capsule by mouth daily.      . pantoprazole (PROTONIX) 40 MG tablet TAKE 1 TABLET(40 MG) BY MOUTH DAILY 30 tablet 1   No current facility-administered medications on file prior to visit.     BP (!) 142/72 (BP Location: Left Arm, Patient Position: Sitting, Cuff Size: Normal)   Pulse 81   Temp 98.2 F (36.8 C) (Oral)   Ht 5\' 1"  (1.549 m)   Wt 130 lb 12.8 oz (59.3 kg)   LMP 01/10/1998   SpO2 96%   BMI 24.71 kg/m     Review of Systems  Constitutional: Negative.   HENT: Negative for congestion, dental problem, hearing loss, rhinorrhea, sinus pressure, sore throat and tinnitus.   Eyes: Negative for pain, discharge and visual disturbance.  Respiratory: Negative for cough and shortness of breath.   Cardiovascular: Negative for chest pain, palpitations and leg swelling.  Gastrointestinal: Negative for abdominal distention, abdominal pain, blood in stool, constipation, diarrhea, nausea and vomiting.  Genitourinary: Negative for difficulty urinating, dysuria, flank pain, frequency, hematuria, pelvic pain, urgency, vaginal bleeding, vaginal discharge and vaginal pain.  Musculoskeletal: Negative for arthralgias, gait problem and joint swelling.  Skin: Negative for rash.  Neurological: Negative for dizziness, syncope, speech difficulty, weakness, numbness and headaches.  Hematological: Negative for adenopathy.  Psychiatric/Behavioral: Negative for agitation, behavioral problems and dysphoric mood. The patient is not nervous/anxious.        Objective:   Physical Exam  Constitutional: She is oriented to person, place, and time. She appears well-developed and well-nourished.  HENT:  Head: Normocephalic.  Right Ear: External ear normal.  Left Ear: External ear normal.  Mouth/Throat: Oropharynx is clear  and moist.  Eyes: Conjunctivae and EOM are normal. Pupils are equal, round, and reactive to light.  Neck: Normal range of motion. Neck supple. No thyromegaly present.  Cardiovascular: Normal rate, regular rhythm, normal heart sounds and intact distal pulses.  Pulmonary/Chest: Effort normal and breath sounds normal.  Abdominal: Soft. Bowel sounds are normal. She exhibits no mass. There is no tenderness.  Musculoskeletal: Normal range of motion.  Lymphadenopathy:    She has no cervical adenopathy.  Neurological: She is alert and oriented to person, place, and time.  Skin: Skin is warm and dry. No rash noted.  Psychiatric: She has a normal mood and affect. Her behavior is normal.          Assessment & Plan:  Diabetes mellitus.  Will check hemoglobin A1c.  Follow-up 6 months if Remains stable  osteoporosis.  Continue calcium vitamin D supplement and Prolia  GERD.  Follow GI  Return here 6 months or as needed Flu vaccine administered  Nyoka Cowden

## 2016-11-24 NOTE — Patient Instructions (Signed)
Take a calcium supplement, plus [phone] units of vitamin D   Please check your hemoglobin A1c every 6  Months  Return in 6 months for follow-up  GI follow-up as scheduled

## 2016-12-28 ENCOUNTER — Telehealth: Payer: Self-pay | Admitting: *Deleted

## 2016-12-28 MED ORDER — ATORVASTATIN CALCIUM 20 MG PO TABS: 20.0000 mg | ORAL_TABLET | Freq: Every day | ORAL | 3 refills | Status: DC

## 2016-12-28 NOTE — Telephone Encounter (Signed)
Atorvastatin 20 mg daily.  If patient has questions or concerns okay to wait until next office visit to discuss further

## 2016-12-28 NOTE — Telephone Encounter (Signed)
Patient is agreeable to initiating statin therapy. Medication filled to pharmacy as requested.

## 2016-12-28 NOTE — Telephone Encounter (Signed)
[facility] recommends patient be prescribed a statin due to having a diagnosis of diabetes. If you agree, please advise medication and I will contact patient and send to pharmacy.  Thanks! 

## 2017-01-02 ENCOUNTER — Encounter: Payer: Self-pay | Admitting: *Deleted

## 2017-01-16 ENCOUNTER — Encounter: Payer: Self-pay | Admitting: Internal Medicine

## 2017-01-16 ENCOUNTER — Ambulatory Visit: Payer: Medicare Other | Admitting: Internal Medicine

## 2017-01-16 VITALS — BP 124/80 | HR 74 | Ht 61.5 in | Wt 134.2 lb

## 2017-01-16 DIAGNOSIS — K219 Gastro-esophageal reflux disease without esophagitis: Secondary | ICD-10-CM | POA: Diagnosis not present

## 2017-01-16 DIAGNOSIS — Z86010 Personal history of colonic polyps: Secondary | ICD-10-CM | POA: Diagnosis not present

## 2017-01-16 MED ORDER — PANTOPRAZOLE SODIUM 40 MG PO TBEC: DELAYED_RELEASE_TABLET | ORAL | 11 refills | Status: DC

## 2017-01-16 NOTE — Progress Notes (Signed)
   Subjective:    Patient ID: Stephanie Grimes, female    DOB: Jan 11, 1950, 67 y.o.   MRN: [id number]  HPI Stephanie Grimes is a 67 year old female with a history of adenomatous colon polyps, GERD with chronic cough and LPR who is here for follow-up.  She was last seen on 08/25/2015.  She is here alone today.  She reports that she has been doing very well.  She uses pantoprazole 40 mg daily.  She has experimented with stopping this medication and after 3 or 4 days she has recurrent heartburn and regurgitation type symptoms but more notably cough and throat clearing.  The symptoms are well controlled on therapy.  No dysphagia or odynophagia.  No abdominal pain.  Her bowel habits have been regular.  She can feel slightly constipated but this improves if she increases water in her diet and also fiber.  No blood in her stool or melena.  Review of Systems As per HPI, otherwise negative  Current Medications, Allergies, Past Medical History, Past Surgical History, Family History and Social History were reviewed in [facility] record.     Objective:   Physical Exam BP 124/80   Pulse 74   Ht 5' 1.5" (1.562 m)   Wt 134 lb 4 oz (60.9 kg)   LMP 01/10/1998   BMI 24.96 kg/m  Constitutional: Well-developed and well-nourished. No distress. HEENT: Normocephalic and atraumatic.  Conjunctivae are normal.  No scleral icterus. Neck: Neck supple. Trachea midline. Cardiovascular: Normal rate, regular rhythm and intact distal pulses. No M/R/G Pulmonary/chest: Effort normal and breath sounds normal. No wheezing, rales or rhonchi. Abdominal: Soft, nontender, nondistended. Bowel sounds active throughout.  Extremities: no clubbing, cyanosis, or edema Neurological: Alert and oriented to person place and time. Skin: Skin is warm and dry. Psychiatric: Normal mood and affect. Behavior is normal.      Assessment & Plan:   67 year old female with a history of adenomatous colon polyps, GERD with chronic  cough and LPR who is here for follow-up.   1.  GERD with LPR/chronic cough --symptoms well controlled on PPI therapy.  Her symptoms return off therapy.  We discussed the risk, benefits and alternatives to chronic PPI therapy and she wishes to continue therapy.  Prior EGD in 2015 was normal.  Continue Protonix 40 mg daily with GERD diet and reflux precautions.  1-2-year follow-up for this issue, sooner if needed  2.  History of adenomatous colon polyps --surveillance colonoscopy due in June 2019.  I reminded her of this, she was already aware.  She will get a reminder in the summer 2019 to schedule surveillance colonoscopy.  15 minutes spent with the patient today. Greater than 50% was spent in counseling and coordination of care with the patient

## 2017-01-16 NOTE — Patient Instructions (Addendum)
If you are age 67 or older, your body mass index should be between 23-30. Your Body mass index is 24.96 kg/m. If this is out of the aforementioned range listed, please consider follow up with your Primary Care Provider.  If you are age 38 or younger, your body mass index should be between 19-25. Your Body mass index is 24.96 kg/m. If this is out of the aformentioned range listed, please consider follow up with your Primary Care Provider.   We have sent the following medications to your pharmacy for you to pick up at your convenience:   Protonix  You will be due for a recall colonoscopy in June 2019. We will send you a reminder in the mail when it gets closer to that time.  You will be due for a one year office visit follow up in January of 2020. We will send you a reminder in the mail when it gets closer to that time.  Thank you.

## 2017-02-11 ENCOUNTER — Other Ambulatory Visit: Payer: Self-pay | Admitting: Internal Medicine

## 2017-05-09 ENCOUNTER — Other Ambulatory Visit: Payer: Self-pay | Admitting: Internal Medicine

## 2017-05-24 ENCOUNTER — Encounter: Payer: Self-pay | Admitting: Internal Medicine

## 2017-05-24 ENCOUNTER — Ambulatory Visit: Payer: Medicare Other | Admitting: Internal Medicine

## 2017-05-24 VITALS — BP 152/86 | HR 70 | Temp 97.8°F | Wt 134.0 lb

## 2017-05-24 DIAGNOSIS — J301 Allergic rhinitis due to pollen: Secondary | ICD-10-CM | POA: Diagnosis not present

## 2017-05-24 DIAGNOSIS — E785 Hyperlipidemia, unspecified: Secondary | ICD-10-CM

## 2017-05-24 DIAGNOSIS — K219 Gastro-esophageal reflux disease without esophagitis: Secondary | ICD-10-CM

## 2017-05-24 DIAGNOSIS — E119 Type 2 diabetes mellitus without complications: Secondary | ICD-10-CM

## 2017-05-24 DIAGNOSIS — M818 Other osteoporosis without current pathological fracture: Secondary | ICD-10-CM | POA: Diagnosis not present

## 2017-05-24 LAB — CBC WITH DIFFERENTIAL/PLATELET
Basophils Absolute: 0.1 10*3/uL (ref 0.0–0.1)
Basophils Relative: 0.7 % (ref 0.0–3.0)
EOS ABS: 0.1 10*3/uL (ref 0.0–0.7)
Eosinophils Relative: 2 % (ref 0.0–5.0)
HCT: 41.1 % (ref 36.0–46.0)
Hemoglobin: 13.9 g/dL (ref 12.0–15.0)
LYMPHS ABS: 2.8 10*3/uL (ref 0.7–4.0)
Lymphocytes Relative: 37.1 % (ref 12.0–46.0)
MCHC: 33.8 g/dL (ref 30.0–36.0)
MCV: 93.3 fl (ref 78.0–100.0)
MONO ABS: 0.6 10*3/uL (ref 0.1–1.0)
Monocytes Relative: 8.3 % (ref 3.0–12.0)
NEUTROS PCT: 51.9 % (ref 43.0–77.0)
Neutro Abs: 3.9 10*3/uL (ref 1.4–7.7)
Platelets: 264 10*3/uL (ref 150.0–400.0)
RBC: 4.4 Mil/uL (ref 3.87–5.11)
RDW: 13.5 % (ref 11.5–15.5)
WBC: 7.6 10*3/uL (ref 4.0–10.5)

## 2017-05-24 LAB — COMPREHENSIVE METABOLIC PANEL
ALK PHOS: 49 U/L (ref 39–117)
ALT: 21 U/L (ref 0–35)
AST: 15 U/L (ref 0–37)
Albumin: 4.4 g/dL (ref 3.5–5.2)
BILIRUBIN TOTAL: 0.6 mg/dL (ref 0.2–1.2)
BUN: 18 mg/dL (ref 6–23)
CO2: 27 mEq/L (ref 19–32)
CREATININE: 0.75 mg/dL (ref 0.40–1.20)
Calcium: 9.8 mg/dL (ref 8.4–10.5)
Chloride: 104 mEq/L (ref 96–112)
GFR: 81.97 mL/min (ref >60.00)
GLUCOSE: 145 mg/dL — AB (ref 70–99)
POTASSIUM: 4.9 meq/L (ref 3.5–5.1)
SODIUM: 140 meq/L (ref 135–145)
TOTAL PROTEIN: 7 g/dL (ref 6.0–8.3)

## 2017-05-24 LAB — POCT GLYCOSYLATED HEMOGLOBIN (HGB A1C): HEMOGLOBIN A1C: 5.9

## 2017-05-24 LAB — LIPID PANEL
CHOLESTEROL: 150 mg/dL (ref 0–200)
HDL: 53.8 mg/dL (ref >39.00)
LDL Cholesterol: 74 mg/dL (ref 0–99)
NONHDL: 95.96
Total CHOL/HDL Ratio: 3
Triglycerides: 110 mg/dL (ref 0.0–149.0)
VLDL: 22 mg/dL (ref 0.0–40.0)

## 2017-05-24 LAB — TSH: TSH: 1.41 u[IU]/mL (ref 0.35–4.50)

## 2017-05-24 NOTE — Patient Instructions (Addendum)
Limit your sodium (Salt) intake  Please check your blood pressure on a regular basis.  If it is consistently greater than 140/90, please make an office appointment.     DASH Eating Plan DASH stands for "Dietary Approaches to Stop Hypertension." The DASH eating plan is a healthy eating plan that has been shown to reduce high blood pressure (hypertension). It may also reduce your risk for type 2 diabetes, heart disease, and stroke. The DASH eating plan may also help with weight loss. What are tips for following this plan? General guidelines  Avoid eating more than 2,300 mg (milligrams) of salt (sodium) a day. If you have hypertension, you may need to reduce your sodium intake to 1,500 mg a day.  Limit alcohol intake to no more than 1 drink a day for nonpregnant women and 2 drinks a day for men. One drink equals 12 oz of beer, 5 oz of wine, or 1 oz of hard liquor.  Work with your health care provider to maintain a healthy body weight or to lose weight. Ask what an ideal weight is for you.  Get at least 30 minutes of exercise that causes your heart to beat faster (aerobic exercise) most days of the week. Activities may include walking, swimming, or biking.  Work with your health care provider or diet and nutrition specialist (dietitian) to adjust your eating plan to your individual calorie needs. Reading food labels  Check food labels for the amount of sodium per serving. Choose foods with less than 5 percent of the Daily Value of sodium. Generally, foods with less than 300 mg of sodium per serving fit into this eating plan.  To find whole grains, look for the word "whole" as the first word in the ingredient list. Shopping  Buy products labeled as "low-sodium" or "no salt added."  Buy fresh foods. Avoid canned foods and premade or frozen meals. Cooking  Avoid adding salt when cooking. Use salt-free seasonings or herbs instead of table salt or sea salt. Check with your health care  provider or pharmacist before using salt substitutes.  Do not fry foods. Cook foods using healthy methods such as baking, boiling, grilling, and broiling instead.  Cook with heart-healthy oils, such as olive, canola, soybean, or sunflower oil. Meal planning   Eat a balanced diet that includes: ? 5 or more servings of fruits and vegetables each day. At each meal, try to fill half of your plate with fruits and vegetables. ? Up to 6-8 servings of whole grains each day. ? Less than 6 oz of lean meat, poultry, or fish each day. A 3-oz serving of meat is about the same size as a deck of cards. One egg equals 1 oz. ? 2 servings of low-fat dairy each day. ? A serving of nuts, seeds, or beans 5 times each week. ? Heart-healthy fats. Healthy fats called Omega-3 fatty acids are found in foods such as flaxseeds and coldwater fish, like sardines, salmon, and mackerel.  Limit how much you eat of the following: ? Canned or prepackaged foods. ? Food that is high in trans fat, such as fried foods. ? Food that is high in saturated fat, such as fatty meat. ? Sweets, desserts, sugary drinks, and other foods with added sugar. ? Full-fat dairy products.  Do not salt foods before eating.  Try to eat at least 2 vegetarian meals each week.  Eat more home-cooked food and less restaurant, buffet, and fast food.  When eating at a restaurant, ask  that your food be prepared with less salt or no salt, if possible. What foods are recommended? The items listed may not be a complete list. Talk with your dietitian about what dietary choices are best for you. Grains Whole-grain or whole-wheat bread. Whole-grain or whole-wheat pasta. Brown rice. Modena Morrow. Bulgur. Whole-grain and low-sodium cereals. Pita bread. Low-fat, low-sodium crackers. Whole-wheat flour tortillas. Vegetables Fresh or frozen vegetables (raw, steamed, roasted, or grilled). Low-sodium or reduced-sodium tomato and vegetable juice. Low-sodium or  reduced-sodium tomato sauce and tomato paste. Low-sodium or reduced-sodium canned vegetables. Fruits All fresh, dried, or frozen fruit. Canned fruit in natural juice (without added sugar). Meat and other protein foods Skinless chicken or [place]. Ground chicken or [place]. Pork with fat trimmed off. Fish and seafood. Egg whites. Dried beans, peas, or lentils. Unsalted nuts, nut butters, and seeds. Unsalted canned beans. Lean cuts of beef with fat trimmed off. Low-sodium, lean deli meat. Dairy Low-fat (1%) or fat-free (skim) milk. Fat-free, low-fat, or reduced-fat cheeses. Nonfat, low-sodium ricotta or cottage cheese. Low-fat or nonfat yogurt. Low-fat, low-sodium cheese. Fats and oils Soft margarine without trans fats. Vegetable oil. Low-fat, reduced-fat, or light mayonnaise and salad dressings (reduced-sodium). Canola, safflower, olive, soybean, and sunflower oils. Avocado. Seasoning and other foods Herbs. Spices. Seasoning mixes without salt. Unsalted popcorn and pretzels. Fat-free sweets. What foods are not recommended? The items listed may not be a complete list. Talk with your dietitian about what dietary choices are best for you. Grains Baked goods made with fat, such as croissants, muffins, or some breads. Dry pasta or rice meal packs. Vegetables Creamed or fried vegetables. Vegetables in a cheese sauce. Regular canned vegetables (not low-sodium or reduced-sodium). Regular canned tomato sauce and paste (not low-sodium or reduced-sodium). Regular tomato and vegetable juice (not low-sodium or reduced-sodium). Angie Fava. Olives. Fruits Canned fruit in a light or heavy syrup. Fried fruit. Fruit in cream or butter sauce. Meat and other protein foods Fatty cuts of meat. Ribs. Fried meat. Berniece Salines. Sausage. Bologna and other processed lunch meats. Salami. Fatback. Hotdogs. Bratwurst. Salted nuts and seeds. Canned beans with added salt. Canned or smoked fish. Whole eggs or egg yolks. Chicken or [place]  with skin. Dairy Whole or 2% milk, cream, and half-and-half. Whole or full-fat cream cheese. Whole-fat or sweetened yogurt. Full-fat cheese. Nondairy creamers. Whipped toppings. Processed cheese and cheese spreads. Fats and oils Butter. Stick margarine. Lard. Shortening. Ghee. Bacon fat. Tropical oils, such as coconut, palm kernel, or palm oil. Seasoning and other foods Salted popcorn and pretzels. Onion salt, garlic salt, seasoned salt, table salt, and sea salt. Worcestershire sauce. Tartar sauce. Barbecue sauce. Teriyaki sauce. Soy sauce, including reduced-sodium. Steak sauce. Canned and packaged gravies. Fish sauce. Oyster sauce. Cocktail sauce. Horseradish that you find on the shelf. Ketchup. Mustard. Meat flavorings and tenderizers. Bouillon cubes. Hot sauce and Tabasco sauce. Premade or packaged marinades. Premade or packaged taco seasonings. Relishes. Regular salad dressings. Where to find more information:  National Heart, Lung, and [place]: https://wilson-eaton.com/  American Heart Association: www.heart.org Summary  The DASH eating plan is a healthy eating plan that has been shown to reduce high blood pressure (hypertension). It may also reduce your risk for type 2 diabetes, heart disease, and stroke.  With the DASH eating plan, you should limit salt (sodium) intake to 2,300 mg a day. If you have hypertension, you may need to reduce your sodium intake to 1,500 mg a day.  When on the DASH eating plan, aim to eat more fresh fruits  and vegetables, whole grains, lean proteins, low-fat dairy, and heart-healthy fats.  Work with your health care provider or diet and nutrition specialist (dietitian) to adjust your eating plan to your individual calorie needs. This information is not intended to replace advice given to you by your health care provider. Make sure you discuss any questions you have with your health care provider. Document Released: 12/16/2010 Document Revised: 12/21/2015  Document Reviewed: 12/21/2015 Elsevier Interactive Patient Education  [employer].

## 2017-05-24 NOTE — Progress Notes (Signed)
Subjective:    Patient ID: Stephanie Grimes, female    DOB: 07/11/1950, 67 y.o.   MRN: [id number]  HPI  Lab Results  Component Value Date   HGBA1C 5.9 05/24/2017   67 year old patient who is seen today for follow-up of type 2 diabetes.  She is followed annually by gynecology and is scheduled for a follow-up in July She now is on statin therapy for dyslipidemia. Doing quite well.  Blood pressure in January and GI was normal Blood pressure bit elevated today.  She does have a younger sister with hypertension.  Otherwise doing quite well She has had an eye examination in February of this year  Past Medical History:  Diagnosis Date  . Adenomatous colon polyp   . Arthritis    fingers  . Blood transfusion without reported diagnosis 1987   after childbirth  . Cataract   . Diabetes mellitus without complication ([place])   . Endometriosis   . GERD (gastroesophageal reflux disease)   . Hyperplastic colon polyp   . IBS (irritable bowel syndrome)   . Personal history of colonic polyps 06/16/2003   hyperplastic  . PONV (postoperative nausea and vomiting)   . VASOMOTOR RHINITIS      Social History   Socioeconomic History  . Marital status: Married    Spouse name: Not on file  . Number of children: 1  . Years of education: Not on file  . Highest education level: Not on file  Occupational History  . Occupation: retired Education officer, museum  Social Needs  . Financial resource strain: Not on file  . Food insecurity:    Worry: Not on file    Inability: Not on file  . Transportation needs:    Medical: Not on file    Non-medical: Not on file  Tobacco Use  . Smoking status: Never Smoker  . Smokeless tobacco: Never Used  Substance and Sexual Activity  . Alcohol use: Yes    Alcohol/week: 0.0 oz    Comment: rarely  . Drug use: No  . Sexual activity: Not Currently    Partners: Male    Birth control/protection: Post-menopausal  Lifestyle  . Physical activity:    Days per week: Not on file      Minutes per session: Not on file  . Stress: Not on file  Relationships  . Social connections:    Talks on phone: Not on file    Gets together: Not on file    Attends religious service: Not on file    Active member of club or organization: Not on file    Attends meetings of clubs or organizations: Not on file    Relationship status: Not on file  . Intimate partner violence:    Fear of current or ex partner: Not on file    Emotionally abused: Not on file    Physically abused: Not on file    Forced sexual activity: Not on file  Other Topics Concern  . Not on file  Social History Narrative  . Not on file    Past Surgical History:  Procedure Laterality Date  . BREAST BIOPSY Right 1990  . COLONOSCOPY N/A 06/24/2014   Procedure: COLONOSCOPY;  Surgeon: Jerene Bears, MD;  Location: [facility];  Service: Gastroenterology;  Laterality: N/A;  . COLONOSCOPY W/ POLYPECTOMY    . HOT HEMOSTASIS N/A 06/24/2014   Procedure: HOT HEMOSTASIS (ARGON PLASMA COAGULATION/BICAP);  Surgeon: Jerene Bears, MD;  Location: [facility];  Service: Gastroenterology;  Laterality: N/A;  .  [place]  . MAXILLARY CYST EXCISION Left 1968  . TONSILLECTOMY  1957    Family History  Problem Relation Age of Onset  . Lung cancer Mother 78       non smoker  . Bladder Cancer Father 28       bladder that spread to pancreas  . Heart attack Father   . Diabetes Father   . Colon polyps Father   . Prostate cancer Brother   . Diabetes Paternal Grandfather   . Heart disease Maternal Grandmother   . Hypertension Maternal Grandmother   . Osteoporosis Maternal Grandmother   . Heart disease Paternal Grandmother   . Hypertension Paternal Grandmother   . Colon cancer Neg Hx     Allergies  Allergen Reactions  . Omeprazole     diarrhea  . Sulfa Antibiotics Swelling    Current Outpatient Medications on File Prior to Visit  Medication Sig Dispense Refill   . atorvastatin (LIPITOR) 20 MG tablet Take 1 tablet (20 mg total) by mouth daily. 90 tablet 3  . Calcium Carbonate-Vit D-Min (CALCIUM 1200 PO) Take by mouth daily.    Marland Kitchen denosumab (PROLIA) 60 MG/ML SOLN injection Inject 60 mg into the skin every 6 (six) months. Administer in upper arm, thigh, or abdomen 1 mL 1  . fluticasone (FLONASE) 50 MCG/ACT nasal spray Place 2 sprays into the nose daily. (Patient taking differently: Place 2 sprays into the nose as needed. ) 16 g 6  . ibuprofen (ADVIL,MOTRIN) 200 MG tablet Take 200 mg by mouth every 6 (six) hours as needed for mild pain.    . metFORMIN (GLUCOPHAGE-XR) 500 MG 24 hr tablet TAKE 2 TABLETS(1000 MG) BY MOUTH DAILY WITH BREAKFAST 180 tablet 1  . Multiple Vitamin (MULTIVITAMIN) capsule Take 1 capsule by mouth daily.      . pantoprazole (PROTONIX) 40 MG tablet TAKE 1 TABLET(40 MG) BY MOUTH DAILY 30 tablet 11   No current facility-administered medications on file prior to visit.     BP (!) 152/86 (BP Location: Right Arm, Patient Position: Sitting, Cuff Size: Large)   Pulse 70   Temp 97.8 F (36.6 C) (Oral)   Wt 134 lb (60.8 kg)   LMP 01/10/1998   SpO2 99%   BMI 24.91 kg/m      Review of Systems  Constitutional: Negative.   HENT: Negative for congestion, dental problem, hearing loss, rhinorrhea, sinus pressure, sore throat and tinnitus.   Eyes: Negative for pain, discharge and visual disturbance.  Respiratory: Negative for cough and shortness of breath.   Cardiovascular: Negative for chest pain, palpitations and leg swelling.  Gastrointestinal: Negative for abdominal distention, abdominal pain, blood in stool, constipation, diarrhea, nausea and vomiting.  Genitourinary: Negative for difficulty urinating, dysuria, flank pain, frequency, hematuria, pelvic pain, urgency, vaginal bleeding, vaginal discharge and vaginal pain.  Musculoskeletal: Negative for arthralgias, gait problem and joint swelling.  Skin: Negative for rash.  Neurological:  Negative for dizziness, syncope, speech difficulty, weakness, numbness and headaches.  Hematological: Negative for adenopathy.  Psychiatric/Behavioral: Negative for agitation, behavioral problems and dysphoric mood. The patient is not nervous/anxious.        Objective:   Physical Exam  Constitutional: She is oriented to person, place, and time. She appears well-developed and well-nourished.  Blood pressure 140/90  HENT:  Head: Normocephalic.  Right Ear: External ear normal.  Left Ear: External ear normal.  Mouth/Throat: Oropharynx is clear and moist.  Eyes: Pupils  are equal, round, and reactive to light. Conjunctivae and EOM are normal.  Neck: Normal range of motion. Neck supple. No thyromegaly present.  Cardiovascular: Normal rate, regular rhythm, normal heart sounds and intact distal pulses.  Pulmonary/Chest: Effort normal and breath sounds normal.  Abdominal: Soft. Bowel sounds are normal. She exhibits no mass. There is no tenderness.  Musculoskeletal: Normal range of motion.  Lymphadenopathy:    She has no cervical adenopathy.  Neurological: She is alert and oriented to person, place, and time.  Skin: Skin is warm and dry. No rash noted.  Psychiatric: She has a normal mood and affect. Her behavior is normal.          Assessment & Plan:  Diabetes mellitus type 2.  Excellent control on metformin therapy  Elevated blood pressure.  Suggested she obtain a home blood pressure monitor.  Placed on DASH diet.  Recheck 3 months  Osteoporosis.  Continue Prolia  Dyslipidemia check a lipid profile  Nyoka Cowden

## 2017-05-25 LAB — HEPATITIS C ANTIBODY
Hepatitis C Ab: NONREACTIVE
SIGNAL TO CUT-OFF: 0.03 (ref <1.00)

## 2017-05-30 ENCOUNTER — Encounter: Payer: Self-pay | Admitting: Internal Medicine

## 2017-07-11 ENCOUNTER — Encounter: Payer: Self-pay | Admitting: Internal Medicine

## 2017-08-04 ENCOUNTER — Ambulatory Visit (INDEPENDENT_AMBULATORY_CARE_PROVIDER_SITE_OTHER): Payer: Medicare Other | Admitting: Obstetrics and Gynecology

## 2017-08-04 ENCOUNTER — Other Ambulatory Visit: Payer: Self-pay

## 2017-08-04 ENCOUNTER — Encounter: Payer: Self-pay | Admitting: Obstetrics and Gynecology

## 2017-08-04 VITALS — BP 132/80 | HR 76 | Resp 14 | Ht 61.5 in | Wt 136.0 lb

## 2017-08-04 DIAGNOSIS — Z01419 Encounter for gynecological examination (general) (routine) without abnormal findings: Secondary | ICD-10-CM | POA: Diagnosis not present

## 2017-08-04 DIAGNOSIS — N3281 Overactive bladder: Secondary | ICD-10-CM

## 2017-08-04 NOTE — Progress Notes (Signed)
67 y.o. G41P1001 Married Caucasian female here for annual exam.    Had Prolia in October 2018 and never had another one.  Walked 3 miles every day.   No vaginal bleeding.  Not sexually active.   Still has urinary urgency.  Has memorized bathrooms.  One cup coffee per day.  No soft drinks. Hemoglobin A1C is 5.9.  Sees PCP for one final check up before he retires.   PCP:  Bluford Kaufmann, MD   Patient's last menstrual period was 01/10/1998.           Sexually active: No.  The current method of family planning is post menopausal status.    Exercising: Yes.    walking Smoker:  no  Health Maintenance: Pap:  08/03/16 Normal History of abnormal Pap:  no MMG:  10/18/16 BIRADS 1 negative/density b Colonoscopy:  06/24/14 normal BMD:   10/18/16  Result  Osteoporosis TDaP:  2015 Gardasil:   n/a BUL:AGTX with Dr. Nori Riis during pregnancy Hep C: 05/24/17 Negative Screening Labs:  PCP   reports that she has never smoked. She has never used smokeless tobacco. She reports that she drinks alcohol. She reports that she does not use drugs.  Past Medical History:  Diagnosis Date  . Adenomatous colon polyp   . Arthritis    fingers  . Blood transfusion without reported diagnosis 1987   after childbirth  . Cataract   . Diabetes mellitus without complication ([place])   . Endometriosis   . GERD (gastroesophageal reflux disease)   . Hyperplastic colon polyp   . IBS (irritable bowel syndrome)   . Personal history of colonic polyps 06/16/2003   hyperplastic  . PONV (postoperative nausea and vomiting)   . VASOMOTOR RHINITIS     Past Surgical History:  Procedure Laterality Date  . BREAST BIOPSY Right 1990  . COLONOSCOPY N/A 06/24/2014   Procedure: COLONOSCOPY;  Surgeon: Jerene Bears, MD;  Location: [facility];  Service: Gastroenterology;  Laterality: N/A;  . COLONOSCOPY W/ POLYPECTOMY    . HOT HEMOSTASIS N/A 06/24/2014   Procedure: HOT HEMOSTASIS (ARGON PLASMA COAGULATION/BICAP);  Surgeon:  Jerene Bears, MD;  Location: [facility];  Service: Gastroenterology;  Laterality: N/A;  . [place]  . MAXILLARY CYST EXCISION Left 1968  . TONSILLECTOMY  1957    Current Outpatient Medications  Medication Sig Dispense Refill  . atorvastatin (LIPITOR) 20 MG tablet Take 1 tablet (20 mg total) by mouth daily. 90 tablet 3  . Calcium Carbonate-Vit D-Min (CALCIUM 1200 PO) Take by mouth daily.    Marland Kitchen denosumab (PROLIA) 60 MG/ML SOLN injection Inject 60 mg into the skin every 6 (six) months. Administer in upper arm, thigh, or abdomen 1 mL 1  . fluticasone (FLONASE) 50 MCG/ACT nasal spray Place 2 sprays into the nose daily. (Patient taking differently: Place 2 sprays into the nose as needed. ) 16 g 6  . ibuprofen (ADVIL,MOTRIN) 200 MG tablet Take 200 mg by mouth every 6 (six) hours as needed for mild pain.    . metFORMIN (GLUCOPHAGE-XR) 500 MG 24 hr tablet TAKE 2 TABLETS(1000 MG) BY MOUTH DAILY WITH BREAKFAST 180 tablet 1  . Multiple Vitamin (MULTIVITAMIN) capsule Take 1 capsule by mouth daily.      . pantoprazole (PROTONIX) 40 MG tablet TAKE 1 TABLET(40 MG) BY MOUTH DAILY 30 tablet 11   No current facility-administered medications for this visit.     Family History  Problem Relation Age  of Onset  . Lung cancer Mother 78       non smoker  . Bladder Cancer Father 43       bladder that spread to pancreas  . Heart attack Father   . Diabetes Father   . Colon polyps Father   . Prostate cancer Brother   . Diabetes Paternal Grandfather   . Heart disease Maternal Grandmother   . Hypertension Maternal Grandmother   . Osteoporosis Maternal Grandmother   . Heart disease Paternal Grandmother   . Hypertension Paternal Grandmother   . Colon cancer Neg Hx     Review of Systems  All other systems reviewed and are negative.   Exam:   BP 132/80 (BP Location: Right Arm, Patient Position: Sitting, Cuff Size: Normal)   Pulse 76   Resp 14    Ht 5' 1.5" (1.562 m)   Wt 136 lb (61.7 kg)   LMP 01/10/1998   BMI 25.28 kg/m     General appearance: alert, cooperative and appears stated age Head: Normocephalic, without obvious abnormality, atraumatic Neck: no adenopathy, supple, symmetrical, trachea midline and thyroid normal to inspection and palpation Lungs: clear to auscultation bilaterally Breasts: normal appearance, no masses or tenderness, No nipple retraction or dimpling, No nipple discharge or bleeding, No axillary or supraclavicular adenopathy Heart: regular rate and rhythm Abdomen: soft, non-tender; no masses, no organomegaly Extremities: extremities normal, atraumatic, no cyanosis or edema Skin: Skin color, texture, turgor normal. No rashes or lesions Lymph nodes: Cervical, supraclavicular, and axillary nodes normal. No abnormal inguinal nodes palpated Neurologic: Grossly normal  Pelvic: External genitalia:   Right vulvar varicose vein.               Urethra:  normal appearing urethra with no masses, tenderness or lesions              Bartholins and Skenes: normal                 Vagina: normal appearing vagina with normal color and discharge, no lesions              Cervix: no lesions              Pap taken: No. Bimanual Exam:  Uterus:  normal size, contour, position, consistency, mobility, non-tender              Adnexa: no mass, fullness, tenderness              Rectal exam: Yes.  .  Confirms.              Anus:  normal sphincter tone, no lesions  Chaperone was present for exam.  Assessment:   Well woman visit with normal exam. Hx DM.  Osteoporosis.  Off Evista and on Prolia.  Hx reflux with globus symptoms.  DM.  White coat syndrome. Over active bladder.   Plan: Mammogram screening. Recommended self breast awareness. Pap and HR HPV as above. Guidelines for Calcium, Vitamin D, regular exercise program including cardiovascular and weight bearing exercise. Declines medication for OAB after discussion  options.  Avoid bladder irritants. Will check on Prolia prescription.  BMD 2020. Follow up annually and prn.   After visit summary provided.

## 2017-08-04 NOTE — Patient Instructions (Signed)
EXERCISE AND DIET:  We recommended that you start or continue a regular exercise program for good health. Regular exercise means any activity that makes your heart beat faster and makes you sweat.  We recommend exercising at least 30 minutes per day at least 3 days a week, preferably 4 or 5.  We also recommend a diet low in fat and sugar.  Inactivity, poor dietary choices and obesity can cause diabetes, heart attack, stroke, and kidney damage, among others.    ALCOHOL AND SMOKING:  Women should limit their alcohol intake to no more than 7 drinks/beers/glasses of wine (combined, not each!) per week. Moderation of alcohol intake to this level decreases your risk of breast cancer and liver damage. And of course, no recreational drugs are part of a healthy lifestyle.  And absolutely no smoking or even second hand smoke. Most people know smoking can cause heart and lung diseases, but did you know it also contributes to weakening of your bones? Aging of your skin?  Yellowing of your teeth and nails?  CALCIUM AND VITAMIN D:  Adequate intake of calcium and Vitamin D are recommended.  The recommendations for exact amounts of these supplements seem to change often, but generally speaking 600 mg of calcium (either carbonate or citrate) and 800 units of Vitamin D per day seems prudent. Certain women may benefit from higher intake of Vitamin D.  If you are among these women, your doctor will have told you during your visit.    PAP SMEARS:  Pap smears, to check for cervical cancer or precancers,  have traditionally been done yearly, although recent scientific advances have shown that most women can have pap smears less often.  However, every woman still should have a physical exam from her gynecologist every year. It will include a breast check, inspection of the vulva and vagina to check for abnormal growths or skin changes, a visual exam of the cervix, and then an exam to evaluate the size and shape of the uterus and  ovaries.  And after 67 years of age, a rectal exam is indicated to check for rectal cancers. We will also provide age appropriate advice regarding health maintenance, like when you should have certain vaccines, screening for sexually transmitted diseases, bone density testing, colonoscopy, mammograms, etc.   MAMMOGRAMS:  All women over 40 years old should have a yearly mammogram. Many facilities now offer a "3D" mammogram, which may cost around $50 extra out of pocket. If possible,  we recommend you accept the option to have the 3D mammogram performed.  It both reduces the number of women who will be called back for extra views which then turn out to be normal, and it is better than the routine mammogram at detecting truly abnormal areas.    COLONOSCOPY:  Colonoscopy to screen for colon cancer is recommended for all women at age 50.  We know, you hate the idea of the prep.  We agree, BUT, having colon cancer and not knowing it is worse!!  Colon cancer so often starts as a polyp that can be seen and removed at colonscopy, which can quite literally save your life!  And if your first colonoscopy is normal and you have no family history of colon cancer, most women don't have to have it again for 10 years.  Once every ten years, you can do something that may end up saving your life, right?  We will be happy to help you get it scheduled when you are ready.    Be sure to check your insurance coverage so you understand how much it will cost.  It may be covered as a preventative service at no cost, but you should check your particular policy.      Overactive Bladder, Adult Overactive bladder is a group of urinary symptoms. With overactive bladder, you may suddenly feel the need to pass urine (urinate) right away. After feeling this sudden urge, you might also leak urine if you cannot get to the bathroom fast enough (urinary incontinence). These symptoms might interfere with your daily work or social activities.  Overactive bladder symptoms may also wake you up at night. Overactive bladder affects the nerve signals between your bladder and your brain. Your bladder may get the signal to empty before it is full. Very sensitive muscles can also make your bladder squeeze too soon. What are the causes? Many things can cause an overactive bladder. Possible causes include:  Urinary tract infection.  Infection of nearby tissues, such as the prostate.  Prostate enlargement.  Being pregnant with twins or more (multiples).  Surgery on the uterus or urethra.  Bladder stones, inflammation, or tumors.  Drinking too much caffeine or alcohol.  Certain medicines, especially those that you take to help your body get rid of extra fluid (diuretics) by increasing urine production.  Muscle or nerve weakness, especially from: ? A spinal cord injury. ? Stroke. ? Multiple sclerosis. ? Parkinson disease.  Diabetes. This can cause a high urine volume that fills the bladder so quickly that the normal urge to urinate is triggered very strongly.  Constipation. A buildup of too much stool can put pressure on your bladder.  What increases the risk? You may be at greater risk for overactive bladder if you:  Are an older adult.  Smoke.  Are going through menopause.  Have prostate problems.  Have a neurological disease, such as stroke, dementia, Parkinson disease, or multiple sclerosis (MS).  Eat or drink things that irritate the bladder. These include alcohol, spicy food, and caffeine.  Are overweight or obese.  What are the signs or symptoms? The signs and symptoms of an overactive bladder include:  Sudden, strong urges to urinate.  Leaking urine.  Urinating eight or more times per day.  Waking up to urinate two or more times per night.  How is this diagnosed? Your health care provider may suspect overactive bladder based on your symptoms. The health care provider will do a physical exam and take  your medical history. Blood or urine tests may also be done. For example, you might need to have a bladder function test to check how well you can hold your urine. You might also need to see a health care provider who specializes in the urinary tract (urologist). How is this treated? Treatment for overactive bladder depends on the cause of your condition and whether it is mild or severe. Certain treatments can be done in your health care provider's office or clinic. You can also make lifestyle changes at home. Options include: Behavioral Treatments  Biofeedback. A specialist uses sensors to help you become aware of your body's signals.  Keeping a daily log of when you need to urinate and what happens after the urge. This may help you manage your condition.  Bladder training. This helps you learn to control the urge to urinate by following a schedule that directs you to urinate at regular intervals (timed voiding). At first, you might have to wait a few minutes after feeling the urge. In time,   you should be able to schedule bathroom visits an hour or more apart.  Kegel exercises. These are exercises to strengthen the pelvic floor muscles, which support the bladder. Toning these muscles can help you control urination, even if your bladder muscles are overactive. A specialist will teach you how to do these exercises correctly. They require daily practice.  Weight loss. If you are obese or overweight, losing weight might relieve your symptoms of overactive bladder. Talk to your health care provider about losing weight and whether there is a specific program or method that would work best for you.  Diet change. This might help if constipation is making your overactive bladder worse. Your health care provider or a dietitian can explain ways to change what you eat to ease constipation. You might also need to consume less alcohol and caffeine or drink other fluids at different times of the day.  Stopping  smoking.  Wearing pads to absorb leakage while you wait for other treatments to take effect. Physical Treatments  Electrical stimulation. Electrodes send gentle pulses of electricity to strengthen the nerves or muscles that help to control the bladder. Sometimes, the electrodes are placed outside of the body. In other cases, they might be placed inside the body (implanted). This treatment can take several months to have an effect.  Supportive devices. Women may need a plastic device that fits into the vagina and supports the bladder (pessary). Medicines Several medicines can help treat overactive bladder and are usually used along with other treatments. Some are injected into the muscles involved in urination. Others come in pill form. Your health care provider may prescribe:  Antispasmodics. These medicines block the signals that the nerves send to the bladder. This keeps the bladder from releasing urine at the wrong time.  Tricyclic antidepressants. These types of antidepressants also relax bladder muscles.  Surgery  You may have a device implanted to help manage the nerve signals that indicate when you need to urinate.  You may have surgery to implant electrodes for electrical stimulation.  Sometimes, very severe cases of overactive bladder require surgery to change the shape of the bladder. Follow these instructions at home:  Take medicines only as directed by your health care provider.  Use any implants or a pessary as directed by your health care provider.  Make any diet or lifestyle changes that are recommended by your health care provider. These might include: ? Drinking less fluid or drinking at different times of the day. If you need to urinate often during the night, you may need to stop drinking fluids early in the evening. ? Cutting down on caffeine or alcohol. Both can make an overactive bladder worse. Caffeine is found in coffee, tea, and sodas. ? Doing Kegel exercises  to strengthen muscles. ? Losing weight if you need to. ? Eating a healthy and balanced diet to prevent constipation.  Keep a journal or log to track how much and when you drink and also when you feel the need to urinate. This will help your health care provider to monitor your condition. Contact a health care provider if:  Your symptoms do not get better after treatment.  Your pain and discomfort are getting worse.  You have more frequent urges to urinate.  You have a fever. Get help right away if: You are not able to control your bladder at all. This information is not intended to replace advice given to you by your health care provider. Make sure you discuss any questions   you have with your health care provider. Document Released: 10/23/2008 Document Revised: 06/04/2015 Document Reviewed: 05/22/2013 [employer] Interactive Patient Education  2018 [employer]  

## 2017-08-04 NOTE — Progress Notes (Signed)
Spoke with patient while in office regarding Prolia. First injection received on 11/02/16, has 1 refill remaining. Provided patient will [facility] specialty pharmacy contact information, instructed patient to contact them directly and request refill. Advised patient once she has authorized refill and shipment to [facility] [place] will contact [facility] to schedule delivery. Once delivery is scheduled, can schedule nurse visit for injection. Patient verbalizes understanding and is agreeable. Instructed to return call if any additional assistance needed.

## 2017-09-07 ENCOUNTER — Ambulatory Visit (AMBULATORY_SURGERY_CENTER): Payer: Self-pay

## 2017-09-07 ENCOUNTER — Encounter: Payer: Self-pay | Admitting: Internal Medicine

## 2017-09-07 VITALS — Ht 61.5 in | Wt 137.2 lb

## 2017-09-07 DIAGNOSIS — Z86010 Personal history of colonic polyps: Secondary | ICD-10-CM

## 2017-09-07 DIAGNOSIS — Z8371 Family history of colonic polyps: Secondary | ICD-10-CM

## 2017-09-07 MED ORDER — NA SULFATE-K SULFATE-MG SULF 17.5-3.13-1.6 GM/177ML PO SOLN: 1.0000 | Freq: Once | ORAL | 0 refills | Status: AC

## 2017-09-07 NOTE — Progress Notes (Signed)
Per pt, no allergies to soy or egg products.Pt not taking any weight loss meds or using  O2 at home.  Pt refused emmi video. 

## 2017-09-21 ENCOUNTER — Encounter: Payer: Self-pay | Admitting: Internal Medicine

## 2017-09-21 ENCOUNTER — Ambulatory Visit (AMBULATORY_SURGERY_CENTER): Payer: Medicare Other | Admitting: Internal Medicine

## 2017-09-21 VITALS — BP 126/70 | HR 62 | Temp 97.5°F | Resp 15 | Ht 61.0 in | Wt 137.0 lb

## 2017-09-21 DIAGNOSIS — Z86010 Personal history of colonic polyps: Secondary | ICD-10-CM | POA: Diagnosis not present

## 2017-09-21 DIAGNOSIS — D122 Benign neoplasm of ascending colon: Secondary | ICD-10-CM

## 2017-09-21 DIAGNOSIS — D123 Benign neoplasm of transverse colon: Secondary | ICD-10-CM

## 2017-09-21 DIAGNOSIS — K635 Polyp of colon: Secondary | ICD-10-CM | POA: Diagnosis not present

## 2017-09-21 MED ORDER — SODIUM CHLORIDE 0.9 % IV SOLN: 500.0000 mL | Freq: Once | INTRAVENOUS | Status: DC

## 2017-09-21 NOTE — Progress Notes (Signed)
Pt's states no medical or surgical changes since previsit or office visit. 

## 2017-09-21 NOTE — Op Note (Signed)
[facility] Patient Name: Stephanie Grimes Procedure Date: 09/21/2017 10:34 AM MRN: [id number] Endoscopist: Jerene Bears , MD Age: 67 Referring MD:  Date of Birth: 1950-03-04 Gender: Female Account #: [id number] Procedure:                Colonoscopy Indications:              Surveillance: Personal history of adenomatous                            polyps on last colonoscopy 3 years ago; EMR for                            large adenoma 2015, repeat colonoscopy 2016 Medicines:                Monitored Anesthesia Care Procedure:                Pre-Anesthesia Assessment:                           - Prior to the procedure, a History and Physical                            was performed, and patient medications and                            allergies were reviewed. The patient's tolerance of                            previous anesthesia was also reviewed. The risks                            and benefits of the procedure and the sedation                            options and risks were discussed with the patient.                            All questions were answered, and informed consent                            was obtained. Prior Anticoagulants: The patient has                            taken no previous anticoagulant or antiplatelet                            agents. ASA Grade Assessment: II - A patient with                            mild systemic disease. After reviewing the risks                            and benefits, the patient was deemed in  satisfactory condition to undergo the procedure.                           After obtaining informed consent, the colonoscope                            was passed under direct vision. Throughout the                            procedure, the patient's blood pressure, pulse, and                            oxygen saturations were monitored continuously. The                            Model PCF-H190DL  [id number]) scope was introduced                            through the anus and advanced to the cecum,                            identified by appendiceal orifice and ileocecal                            valve. The colonoscopy was performed without                            difficulty. The patient tolerated the procedure                            well. The quality of the bowel preparation was                            good. The ileocecal valve, appendiceal orifice, and                            rectum were photographed. Scope In: 10:39:43 AM Scope Out: 10:55:50 AM Scope Withdrawal Time: 0 hours 12 minutes 48 seconds  Total Procedure Duration: 0 hours 16 minutes 7 seconds  Findings:                 The digital rectal exam was normal.                           Two sessile polyps were found in the transverse                            colon and ascending colon. The polyps were 2 to 3                            mm in size. These polyps were removed with a cold                            snare. Resection and retrieval were complete.  A tattoo was seen in the ascending colon. A                            post-polypectomy scar was found at the tattoo site.                            There was no evidence of residual polyp tissue.                           The exam was otherwise without abnormality on                            direct and retroflexion views. Complications:            No immediate complications. Estimated Blood Loss:     Estimated blood loss: none. Impression:               - Two 2 to 3 mm polyps in the transverse colon and                            in the ascending colon, removed with a cold snare.                            Resected and retrieved.                           - A tattoo was seen in the ascending colon. A                            post-polypectomy scar was found at the tattoo site.                            There was no evidence  of residual polyp tissue.                           - The examination was otherwise normal on direct                            and retroflexion views. Recommendation:           - Patient has a contact number available for                            emergencies. The signs and symptoms of potential                            delayed complications were discussed with the                            patient. Return to normal activities tomorrow.                            Written discharge instructions were provided to the  patient.                           - Resume previous diet.                           - Continue present medications.                           - Await pathology results.                           - Repeat colonoscopy is recommended for                            surveillance. The colonoscopy date will be                            determined after pathology results from today's                            exam become available for review. Jerene Bears, MD 09/21/2017 10:59:32 AM This report has been signed electronically.

## 2017-09-21 NOTE — Progress Notes (Signed)
Called to room to assist during endoscopic procedure.  Patient ID and intended procedure confirmed with present staff. Received instructions for my participation in the procedure from the performing physician.  

## 2017-09-21 NOTE — Patient Instructions (Signed)
Thank you for allowing [place] to care for you today!  Resume previous diet and medications.  Return to normal activities tomorrow.  Await pathology results by mail,  approx 2 weeks.  Next colonoscopy depends on results of pathology.  Handout given for polyps.     YOU HAD AN ENDOSCOPIC PROCEDURE TODAY AT [facility]:   Refer to the procedure report that was given to you for any specific questions about what was found during the examination.  If the procedure report does not answer your questions, please call your gastroenterologist to clarify.  If you requested that your care partner not be given the details of your procedure findings, then the procedure report has been included in a sealed envelope for you to review at your convenience later.  YOU SHOULD EXPECT: Some feelings of bloating in the abdomen. Passage of more gas than usual.  Walking can help get rid of the air that was put into your GI tract during the procedure and reduce the bloating. If you had a lower endoscopy (such as a colonoscopy or flexible sigmoidoscopy) you may notice spotting of blood in your stool or on the toilet paper. If you underwent a bowel prep for your procedure, you may not have a normal bowel movement for a few days.  Please Note:  You might notice some irritation and congestion in your nose or some drainage.  This is from the oxygen used during your procedure.  There is no need for concern and it should clear up in a day or so.  SYMPTOMS TO REPORT IMMEDIATELY:   Following lower endoscopy (colonoscopy or flexible sigmoidoscopy):  Excessive amounts of blood in the stool  Significant tenderness or worsening of abdominal pains  Swelling of the abdomen that is new, acute  Fever of 100F or higher   For urgent or emergent issues, a gastroenterologist can be reached at any hour by calling [phone].   DIET:  We do recommend a small meal at first, but then you may proceed to your regular  diet.  Drink plenty of fluids but you should avoid alcoholic beverages for 24 hours.  ACTIVITY:  You should plan to take it easy for the rest of today and you should NOT DRIVE or use heavy machinery until tomorrow (because of the sedation medicines used during the test).    FOLLOW UP: Our staff will call the number listed on your records the next business day following your procedure to check on you and address any questions or concerns that you may have regarding the information given to you following your procedure. If we do not reach you, we will leave a message.  However, if you are feeling well and you are not experiencing any problems, there is no need to return our call.  We will assume that you have returned to your regular daily activities without incident.  If any biopsies were taken you will be contacted by phone or by letter within the next 1-3 weeks.  Please call us at [phone] if you have not heard about the biopsies in 3 weeks.    SIGNATURES/CONFIDENTIALITY: You and/or your care partner have signed paperwork which will be entered into your electronic medical record.  These signatures attest to the fact that that the information above on your After Visit Summary has been reviewed and is understood.  Full responsibility of the confidentiality of this discharge information lies with you and/or your care-partner.

## 2017-09-22 ENCOUNTER — Telehealth: Payer: Self-pay

## 2017-09-22 NOTE — Telephone Encounter (Signed)
  Follow up Call-  Call back number 09/21/2017  Post procedure Call Back phone  # [phone]  Permission to leave phone message Yes  Some recent data might be hidden     Patient questions:  Do you have a fever, pain , or abdominal swelling? No. Pain Score  0 *  Have you tolerated food without any problems? Yes.    Have you been able to return to your normal activities? Yes.    Do you have any questions about your discharge instructions: Diet   No. Medications  No. Follow up visit  No.  Do you have questions or concerns about your Care? No.  Actions: * If pain score is 4 or above: No action needed, pain <4.

## 2017-09-27 ENCOUNTER — Encounter: Payer: Self-pay | Admitting: Internal Medicine

## 2017-11-03 ENCOUNTER — Other Ambulatory Visit: Payer: Self-pay | Admitting: Internal Medicine

## 2017-11-22 ENCOUNTER — Ambulatory Visit: Payer: Medicare Other | Admitting: Family Medicine

## 2017-11-22 ENCOUNTER — Encounter: Payer: Self-pay | Admitting: Family Medicine

## 2017-11-22 VITALS — BP 164/98 | HR 100 | Temp 98.2°F | Wt 135.8 lb

## 2017-11-22 DIAGNOSIS — Z23 Encounter for immunization: Secondary | ICD-10-CM | POA: Diagnosis not present

## 2017-11-22 DIAGNOSIS — M818 Other osteoporosis without current pathological fracture: Secondary | ICD-10-CM | POA: Diagnosis not present

## 2017-11-22 DIAGNOSIS — R0982 Postnasal drip: Secondary | ICD-10-CM

## 2017-11-22 DIAGNOSIS — R030 Elevated blood-pressure reading, without diagnosis of hypertension: Secondary | ICD-10-CM

## 2017-11-22 DIAGNOSIS — E1169 Type 2 diabetes mellitus with other specified complication: Secondary | ICD-10-CM | POA: Diagnosis not present

## 2017-11-22 DIAGNOSIS — L719 Rosacea, unspecified: Secondary | ICD-10-CM

## 2017-11-22 DIAGNOSIS — R42 Dizziness and giddiness: Secondary | ICD-10-CM

## 2017-11-22 DIAGNOSIS — E785 Hyperlipidemia, unspecified: Secondary | ICD-10-CM | POA: Diagnosis not present

## 2017-11-22 DIAGNOSIS — K219 Gastro-esophageal reflux disease without esophagitis: Secondary | ICD-10-CM

## 2017-11-22 DIAGNOSIS — E119 Type 2 diabetes mellitus without complications: Secondary | ICD-10-CM

## 2017-11-22 LAB — LIPID PANEL
Cholesterol: 184 mg/dL (ref 0–200)
HDL: 58.1 mg/dL (ref >39.00)
LDL Cholesterol: 100 mg/dL — ABNORMAL HIGH (ref 0–99)
NONHDL: 126.07
Total CHOL/HDL Ratio: 3
Triglycerides: 132 mg/dL (ref 0.0–149.0)
VLDL: 26.4 mg/dL (ref 0.0–40.0)

## 2017-11-22 LAB — MICROALBUMIN / CREATININE URINE RATIO
Creatinine,U: 50 mg/dL
MICROALB/CREAT RATIO: 1.5 mg/g (ref 0.0–30.0)
Microalb, Ur: 0.8 mg/dL (ref 0.0–1.9)

## 2017-11-22 LAB — COMPREHENSIVE METABOLIC PANEL
ALBUMIN: 4.8 g/dL (ref 3.5–5.2)
ALT: 24 U/L (ref 0–35)
AST: 16 U/L (ref 0–37)
Alkaline Phosphatase: 65 U/L (ref 39–117)
BUN: 22 mg/dL (ref 6–23)
CHLORIDE: 102 meq/L (ref 96–112)
CO2: 33 mEq/L — ABNORMAL HIGH (ref 19–32)
Calcium: 10.3 mg/dL (ref 8.4–10.5)
Creatinine, Ser: 0.8 mg/dL (ref 0.40–1.20)
GFR: 75.98 mL/min (ref >60.00)
GLUCOSE: 141 mg/dL — AB (ref 70–99)
Potassium: 5.1 mEq/L (ref 3.5–5.1)
SODIUM: 140 meq/L (ref 135–145)
TOTAL PROTEIN: 7.3 g/dL (ref 6.0–8.3)
Total Bilirubin: 0.6 mg/dL (ref 0.2–1.2)

## 2017-11-22 LAB — CBC WITH DIFFERENTIAL/PLATELET
Basophils Absolute: 0 10*3/uL (ref 0.0–0.1)
Basophils Relative: 0.4 % (ref 0.0–3.0)
EOS ABS: 0.1 10*3/uL (ref 0.0–0.7)
Eosinophils Relative: 1.5 % (ref 0.0–5.0)
HCT: 41.2 % (ref 36.0–46.0)
HEMOGLOBIN: 13.9 g/dL (ref 12.0–15.0)
Lymphocytes Relative: 30.3 % (ref 12.0–46.0)
Lymphs Abs: 2.6 10*3/uL (ref 0.7–4.0)
MCHC: 33.6 g/dL (ref 30.0–36.0)
MCV: 92.1 fl (ref 78.0–100.0)
MONO ABS: 0.6 10*3/uL (ref 0.1–1.0)
Monocytes Relative: 7.5 % (ref 3.0–12.0)
Neutro Abs: 5.2 10*3/uL (ref 1.4–7.7)
Neutrophils Relative %: 60.3 % (ref 43.0–77.0)
Platelets: 284 10*3/uL (ref 150.0–400.0)
RBC: 4.48 Mil/uL (ref 3.87–5.11)
RDW: 13.5 % (ref 11.5–15.5)
WBC: 8.6 10*3/uL (ref 4.0–10.5)

## 2017-11-22 LAB — HEMOGLOBIN A1C: HEMOGLOBIN A1C: 6.6 % — AB (ref 4.6–6.5)

## 2017-11-22 MED ORDER — ZOSTER VAC RECOMB ADJUVANTED 50 MCG/0.5ML IM SUSR: 0.5000 mL | Freq: Once | INTRAMUSCULAR | 0 refills | Status: AC

## 2017-11-22 NOTE — Patient Instructions (Signed)
Give me update on home blood pressures in 2 weeks time via mychart. I will let you know about follow up pending this.

## 2017-11-22 NOTE — Progress Notes (Signed)
Serra Younan DOB: [birth year] Encounter date: 11/22/2017  This isa 67 y.o. female who presents to establish care. Chief Complaint  Patient presents with  . Establish Care    History of present illness:  Has had a cough for 20 years. Has seen ENT, takes medication for reflux. Used to just come during school year and relieved in summer. Now it is just irritated and just keeps on coughing. ENT even scoped her and said it was red, but nothing abnormal. Even saw allergy. Had allergy testing twice and it was negative.   Has vertigo - notes only with lying down at night for a few seconds and then when sitting up. Occasionally notes for a split second while picking something from lower shelf at grocery store.   esoph reflux: on protonix  DM:on glucophage; last A1C was 5/15 and was 5.9; doesn't check sugars at home.  Osteoporosis: forgot to call in to continue with the prolia.  last bone density was 10/2016. Was due for injection last month or this month.   Hyperlipidemia: on lipitor; no problems with this medication.     Past Medical History:  Diagnosis Date  . Adenomatous colon polyp   . Allergy    seasonal  . Arthritis    fingers  . Blood transfusion without reported diagnosis 1987   after childbirth  . Cataract   . Diabetes mellitus without complication ([place])   . Endometriosis   . GERD (gastroesophageal reflux disease)   . Hyperlipidemia   . Hyperplastic colon polyp   . IBS (irritable bowel syndrome)   . Personal history of colonic polyps 06/16/2003   hyperplastic  . PONV (postoperative nausea and vomiting)   . VASOMOTOR RHINITIS    Past Surgical History:  Procedure Laterality Date  . BREAST BIOPSY Right 1990   benign  . COLONOSCOPY N/A 06/24/2014   Procedure: COLONOSCOPY;  Surgeon: Jerene Bears, MD;  Location: [facility];  Service: Gastroenterology;  Laterality: N/A;  . COLONOSCOPY W/ POLYPECTOMY    . HOT HEMOSTASIS N/A 06/24/2014   Procedure: HOT HEMOSTASIS (ARGON  PLASMA COAGULATION/BICAP);  Surgeon: Jerene Bears, MD;  Location: [facility];  Service: Gastroenterology;  Laterality: N/A;  . [place]   due to TMJ  . MAXILLARY CYST EXCISION Left 1968   benign  . TONSILLECTOMY  1957   Allergies  Allergen Reactions  . Sulfa Antibiotics Swelling    Extremities swell up, no airway problem  . Omeprazole     diarrhea   Current Meds  Medication Sig  . atorvastatin (LIPITOR) 20 MG tablet Take 1 tablet (20 mg total) by mouth daily.  . Calcium Carbonate-Vit D-Min (CALCIUM 1200 PO) Take by mouth daily.  Marland Kitchen denosumab (PROLIA) 60 MG/ML SOLN injection Inject 60 mg into the skin every 6 (six) months. Administer in upper arm, thigh, or abdomen  . fluticasone (FLONASE) 50 MCG/ACT nasal spray Place 2 sprays into the nose daily. (Patient taking differently: Place 2 sprays into the nose as needed. )  . ibuprofen (ADVIL,MOTRIN) 200 MG tablet Take 200 mg by mouth as needed for mild pain.   . metFORMIN (GLUCOPHAGE-XR) 500 MG 24 hr tablet TAKE 2 TABLETS(1000 MG) BY MOUTH DAILY WITH BREAKFAST  . Multiple Vitamin (MULTIVITAMIN) capsule Take 1 capsule by mouth daily.    . pantoprazole (PROTONIX) 40 MG tablet TAKE 1 TABLET(40 MG) BY MOUTH DAILY   Social History   Tobacco Use  . Smoking status: Never  Smoker  . Smokeless tobacco: Never Used  Substance Use Topics  . Alcohol use: Yes    Alcohol/week: 0.0 standard drinks    Comment: rarely   Family History  Problem Relation Age of Onset  . Lung cancer Mother 32       non smoker  . Bladder Cancer Father 106       bladder that spread to pancreas  . Heart attack Father   . Diabetes Father   . Colon polyps Father   . Prostate cancer Brother   . Diabetes Paternal Grandfather   . Heart disease Maternal Grandmother   . Hypertension Maternal Grandmother   . Osteoporosis Maternal Grandmother   . Heart disease Paternal Grandmother   . Hypertension Paternal  Grandmother   . Colon cancer Neg Hx      Review of Systems  Constitutional: Negative for chills, fatigue and fever.  Respiratory: Negative for cough, chest tightness, shortness of breath and wheezing.   Cardiovascular: Negative for chest pain, palpitations and leg swelling.  Neurological: Positive for dizziness.    Objective:  BP (!) 164/98 (BP Location: Right Arm, Patient Position: Sitting, Cuff Size: Normal)   Pulse 100   Temp 98.2 F (36.8 C) (Oral)   Wt 135 lb 12.8 oz (61.6 kg)   LMP 01/10/1998   SpO2 97%   BMI 25.66 kg/m   Weight: 135 lb 12.8 oz (61.6 kg)   BP Readings from Last 3 Encounters:  11/22/17 (!) 164/98  09/21/17 126/70  08/04/17 132/80   Wt Readings from Last 3 Encounters:  11/22/17 135 lb 12.8 oz (61.6 kg)  09/21/17 137 lb (62.1 kg)  09/07/17 137 lb 3.2 oz (62.2 kg)    Physical Exam  Constitutional: She appears well-developed and well-nourished. No distress.  HENT:  Head: Normocephalic and atraumatic.  Cardiovascular: Normal rate, regular rhythm, normal heart sounds and intact distal pulses.  No murmur heard. Pulmonary/Chest: Effort normal and breath sounds normal.  Abdominal: Soft. Bowel sounds are normal. She exhibits no distension. There is no tenderness. There is no guarding.  Skin: Skin is warm and dry.  Sensory exam of the foot is normal, tested with the monofilament. Good pulses, no lesions or ulcers, good peripheral pulses.  Psychiatric: She has a normal mood and affect. Judgment normal.    Assessment/Plan:  1. Gastroesophageal reflux disease without esophagitis Follows regularly with GI. Continue with protonix.  2. Diabetes mellitus without complication ([place]) Has been well controlled. Bloodwork today.  - Microalbumin / creatinine urine ratio; Future - [facility] DIABETES FOOT EXAM - Hemoglobin A1c; Future  3. Other osteoporosis, unspecified pathological fracture presence Getting prolia through obgyn. Encouraged her to follow up and not  miss doses of this.  4. Hyperlipidemia associated with type 2 diabetes mellitus ([place]) Cont lipitor. Will check today. - Lipid panel; Future - Comprehensive metabolic panel; Future  5. Rosacea If worsening let me know; we can send in topical tx if wait for derm is substantial. - Ambulatory referral to Dermatology  6. Post-nasal drainage Chronic; continue current management.  7. Vertigo Stable. Not interferring with daily life. Takes time with position changes. Will let me know if any worsening.  8. Elevated blood pressure reading Higher on recheck. She will get home cuff and monitor; will report back via mychart in 2 weeks. Hasn't had issues with high bp in past and last reading in office was good.  - CBC with Differential/Platelet; Future  Return pending blood pressure update.  Micheline Rough, MD

## 2017-11-27 ENCOUNTER — Other Ambulatory Visit: Payer: Self-pay | Admitting: Family Medicine

## 2017-11-27 MED ORDER — ATORVASTATIN CALCIUM 20 MG PO TABS: ORAL_TABLET | ORAL | 1 refills | Status: DC

## 2018-01-18 ENCOUNTER — Telehealth: Payer: Self-pay

## 2018-01-18 MED ORDER — ATORVASTATIN CALCIUM 40 MG PO TABS: ORAL_TABLET | ORAL | 0 refills | Status: DC

## 2018-01-18 NOTE — Telephone Encounter (Signed)
Copied from [place] [id number]. Topic: General - Other >> Jan 18, 2018 11:06 AM Carolyn Stare wrote:  Pt call to say her pharmacy told her the insurance company will not cover 30mg  and is asking what to do .Marland Kitchen Pt will reach out to the insurance company and call us back     atorvastatin (LIPITOR) 20 MG tablet >> Jan 18, 2018 11:27 AM Ahmed Prima L wrote: Patient states that Dr Ethlyn Gallery moved her dosage from 20mg  to 30mg  and insurance said that they do not make a 30mg . They advised her to get a 10mg  and a 20mg . She said she was breaking the 20mg  in half and she said that insurance did not like that at all.

## 2018-01-18 NOTE — Telephone Encounter (Signed)
Please advise. Is 30mg  the correct dose? If so, ok to send in separate prescriptions?

## 2018-01-18 NOTE — Telephone Encounter (Signed)
Probably easier for her to just jump to a 40mg  daily rather than taking 2 separate prescriptions. If she is ok with this we can just send in 90 day supply of 40mg  and plan to recheck cholesterol in a few months.

## 2018-01-18 NOTE — Telephone Encounter (Signed)
Spoke with patient she stated she was ok with taking 40 mg. Rx has been sent in. Nothing further needed.

## 2018-01-23 ENCOUNTER — Other Ambulatory Visit: Payer: Self-pay

## 2018-01-23 NOTE — Telephone Encounter (Signed)
Last filled 11/03/17 by Dr. Sherren Mocha Last OV 11/22/17 for an acute visit  Patient patient has not scheduled a TOC appointment.

## 2018-01-27 MED ORDER — METFORMIN HCL ER 500 MG PO TB24: ORAL_TABLET | ORAL | 0 refills | Status: DC

## 2018-02-19 ENCOUNTER — Encounter: Payer: Self-pay | Admitting: Internal Medicine

## 2018-03-01 LAB — HM DIABETES EYE EXAM

## 2018-03-05 ENCOUNTER — Encounter: Payer: Self-pay | Admitting: Family Medicine

## 2018-04-06 ENCOUNTER — Telehealth: Payer: Self-pay | Admitting: *Deleted

## 2018-04-06 MED ORDER — PANTOPRAZOLE SODIUM 40 MG PO TBEC: DELAYED_RELEASE_TABLET | ORAL | 5 refills | Status: DC

## 2018-04-06 NOTE — Telephone Encounter (Signed)
Contacted patient regarding her scheduled appointment for 04/09/18 for "GERD, 1 year follow up." Patient states that she is actually not having any problems right now. She just needs some refills of pantoprazole. I advised that we will cancel appointment for now and send some pantoprazole refills. I asked that she call us back in a couple of months once things with COVID-19 have hopefully settled down and she can come to the office for her follow up. She verbalizes understanding.

## 2018-04-09 ENCOUNTER — Ambulatory Visit: Payer: Medicare Other | Admitting: Internal Medicine

## 2018-05-05 ENCOUNTER — Other Ambulatory Visit: Payer: Self-pay | Admitting: Family Medicine

## 2018-05-11 ENCOUNTER — Other Ambulatory Visit: Payer: Self-pay | Admitting: Family Medicine

## 2018-07-05 ENCOUNTER — Emergency Department (HOSPITAL_COMMUNITY)
Admission: EM | Admit: 2018-07-05 | Discharge: 2018-07-05 | Disposition: A | Discharge status: Home / Self Care | Payer: No Typology Code available for payment source | Attending: Emergency Medicine | Admitting: Emergency Medicine

## 2018-07-05 ENCOUNTER — Emergency Department (HOSPITAL_COMMUNITY): Payer: No Typology Code available for payment source

## 2018-07-05 ENCOUNTER — Encounter (HOSPITAL_COMMUNITY): Payer: Self-pay | Admitting: Emergency Medicine

## 2018-07-05 DIAGNOSIS — Y93I9 Activity, other involving external motion: Secondary | ICD-10-CM | POA: Diagnosis not present

## 2018-07-05 DIAGNOSIS — E119 Type 2 diabetes mellitus without complications: Secondary | ICD-10-CM | POA: Diagnosis not present

## 2018-07-05 DIAGNOSIS — S00432A Contusion of left ear, initial encounter: Secondary | ICD-10-CM | POA: Diagnosis not present

## 2018-07-05 DIAGNOSIS — Y999 Unspecified external cause status: Secondary | ICD-10-CM | POA: Diagnosis not present

## 2018-07-05 DIAGNOSIS — Z7984 Long term (current) use of oral hypoglycemic drugs: Secondary | ICD-10-CM | POA: Insufficient documentation

## 2018-07-05 DIAGNOSIS — S40022A Contusion of left upper arm, initial encounter: Secondary | ICD-10-CM

## 2018-07-05 DIAGNOSIS — S5012XA Contusion of left forearm, initial encounter: Secondary | ICD-10-CM | POA: Insufficient documentation

## 2018-07-05 DIAGNOSIS — S00402A Unspecified superficial injury of left ear, initial encounter: Secondary | ICD-10-CM | POA: Diagnosis present

## 2018-07-05 DIAGNOSIS — Y92414 Local residential or business street as the place of occurrence of the external cause: Secondary | ICD-10-CM | POA: Insufficient documentation

## 2018-07-05 DIAGNOSIS — Z79899 Other long term (current) drug therapy: Secondary | ICD-10-CM | POA: Insufficient documentation

## 2018-07-05 LAB — BASIC METABOLIC PANEL
Anion gap: 11 (ref 5–15)
BUN: 16 mg/dL (ref 8–23)
CO2: 22 mmol/L (ref 22–32)
Calcium: 8.8 mg/dL — ABNORMAL LOW (ref 8.9–10.3)
Chloride: 108 mmol/L (ref 98–111)
Creatinine, Ser: 0.73 mg/dL (ref 0.44–1.00)
GFR calc Af Amer: >60 mL/min (ref >60)
GFR calc non Af Amer: >60 mL/min (ref >60)
Glucose, Bld: 159 mg/dL — ABNORMAL HIGH (ref 70–99)
Potassium: 3.9 mmol/L (ref 3.5–5.1)
Sodium: 141 mmol/L (ref 135–145)

## 2018-07-05 LAB — CBC WITH DIFFERENTIAL/PLATELET
Abs Immature Granulocytes: 0.02 10*3/uL (ref 0.00–0.07)
Basophils Absolute: 0 10*3/uL (ref 0.0–0.1)
Basophils Relative: 1 %
Eosinophils Absolute: 0.1 10*3/uL (ref 0.0–0.5)
Eosinophils Relative: 2 %
HCT: 38.1 % (ref 36.0–46.0)
Hemoglobin: 12.6 g/dL (ref 12.0–15.0)
Immature Granulocytes: 0 %
Lymphocytes Relative: 37 %
Lymphs Abs: 2.5 10*3/uL (ref 0.7–4.0)
MCH: 30.7 pg (ref 26.0–34.0)
MCHC: 33.1 g/dL (ref 30.0–36.0)
MCV: 92.9 fL (ref 80.0–100.0)
Monocytes Absolute: 0.6 10*3/uL (ref 0.1–1.0)
Monocytes Relative: 8 %
Neutro Abs: 3.5 10*3/uL (ref 1.7–7.7)
Neutrophils Relative %: 52 %
Platelets: 231 10*3/uL (ref 150–400)
RBC: 4.1 MIL/uL (ref 3.87–5.11)
RDW: 13.3 % (ref 11.5–15.5)
WBC: 6.8 10*3/uL (ref 4.0–10.5)
nRBC: 0 % (ref 0.0–0.2)

## 2018-07-05 NOTE — ED Notes (Addendum)
Daughter up front- nurse first able to provide daughter an update- pt also made aware pt has her cellphone.
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
## 2018-07-05 NOTE — ED Notes (Signed)
Patient transported to CT 

## 2018-07-16 ENCOUNTER — Telehealth: Payer: Self-pay | Admitting: Family Medicine
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
## 2018-07-16 NOTE — Telephone Encounter (Signed)
I called the pt and she agreed to come in Wednesday 7/8 to arrive at 12pm. Message sent to [facility] to add to the schedule.

## 2018-07-16 NOTE — Telephone Encounter (Signed)
Either put her in 30 minute slot and take acute spots or book her at 12:30 on my lunch.

## 2018-07-18 ENCOUNTER — Ambulatory Visit (INDEPENDENT_AMBULATORY_CARE_PROVIDER_SITE_OTHER): Payer: Medicare Other | Admitting: Family Medicine

## 2018-07-18 ENCOUNTER — Other Ambulatory Visit: Payer: Self-pay

## 2018-07-18 ENCOUNTER — Encounter: Payer: Self-pay | Admitting: Family Medicine

## 2018-07-18 VITALS — BP 160/90 | Temp 98.1°F

## 2018-07-18 DIAGNOSIS — R053 Chronic cough: Secondary | ICD-10-CM

## 2018-07-18 DIAGNOSIS — I10 Essential (primary) hypertension: Secondary | ICD-10-CM | POA: Diagnosis not present

## 2018-07-18 DIAGNOSIS — M818 Other osteoporosis without current pathological fracture: Secondary | ICD-10-CM

## 2018-07-18 DIAGNOSIS — E785 Hyperlipidemia, unspecified: Secondary | ICD-10-CM | POA: Diagnosis not present

## 2018-07-18 DIAGNOSIS — E1169 Type 2 diabetes mellitus with other specified complication: Secondary | ICD-10-CM | POA: Diagnosis not present

## 2018-07-18 DIAGNOSIS — E119 Type 2 diabetes mellitus without complications: Secondary | ICD-10-CM | POA: Diagnosis not present

## 2018-07-18 DIAGNOSIS — R05 Cough: Secondary | ICD-10-CM

## 2018-07-18 LAB — CBC WITH DIFFERENTIAL/PLATELET
Basophils Absolute: 0.1 10*3/uL (ref 0.0–0.1)
Basophils Relative: 0.6 % (ref 0.0–3.0)
Eosinophils Absolute: 0.1 10*3/uL (ref 0.0–0.7)
Eosinophils Relative: 1.3 % (ref 0.0–5.0)
HCT: 40 % (ref 36.0–46.0)
Hemoglobin: 13.5 g/dL (ref 12.0–15.0)
Lymphocytes Relative: 33.4 % (ref 12.0–46.0)
Lymphs Abs: 3 10*3/uL (ref 0.7–4.0)
MCHC: 33.7 g/dL (ref 30.0–36.0)
MCV: 91.8 fl (ref 78.0–100.0)
Monocytes Absolute: 0.7 10*3/uL (ref 0.1–1.0)
Monocytes Relative: 7.4 % (ref 3.0–12.0)
Neutro Abs: 5.1 10*3/uL (ref 1.4–7.7)
Neutrophils Relative %: 57.3 % (ref 43.0–77.0)
Platelets: 285 10*3/uL (ref 150.0–400.0)
RBC: 4.36 Mil/uL (ref 3.87–5.11)
RDW: 13.9 % (ref 11.5–15.5)
WBC: 8.9 10*3/uL (ref 4.0–10.5)

## 2018-07-18 LAB — LIPID PANEL
Cholesterol: 193 mg/dL (ref 0–200)
HDL: 52.1 mg/dL (ref >39.00)
LDL Cholesterol: 112 mg/dL — ABNORMAL HIGH (ref 0–99)
NonHDL: 141.29
Total CHOL/HDL Ratio: 4
Triglycerides: 144 mg/dL (ref 0.0–149.0)
VLDL: 28.8 mg/dL (ref 0.0–40.0)

## 2018-07-18 LAB — COMPREHENSIVE METABOLIC PANEL
ALT: 20 U/L (ref 0–35)
AST: 12 U/L (ref 0–37)
Albumin: 4.7 g/dL (ref 3.5–5.2)
Alkaline Phosphatase: 72 U/L (ref 39–117)
BUN: 21 mg/dL (ref 6–23)
CO2: 26 mEq/L (ref 19–32)
Calcium: 9.3 mg/dL (ref 8.4–10.5)
Chloride: 105 mEq/L (ref 96–112)
Creatinine, Ser: 0.65 mg/dL (ref 0.40–1.20)
GFR: 90.66 mL/min (ref >60.00)
Glucose, Bld: 130 mg/dL — ABNORMAL HIGH (ref 70–99)
Potassium: 4.5 mEq/L (ref 3.5–5.1)
Sodium: 141 mEq/L (ref 135–145)
Total Bilirubin: 0.5 mg/dL (ref 0.2–1.2)
Total Protein: 7.2 g/dL (ref 6.0–8.3)

## 2018-07-18 LAB — MICROALBUMIN / CREATININE URINE RATIO
Creatinine,U: 81.3 mg/dL
Microalb Creat Ratio: 2.4 mg/g (ref 0.0–30.0)
Microalb, Ur: 2 mg/dL — ABNORMAL HIGH (ref 0.0–1.9)

## 2018-07-18 LAB — HEMOGLOBIN A1C: Hgb A1c MFr Bld: 7 % — ABNORMAL HIGH (ref 4.6–6.5)

## 2018-07-18 LAB — VITAMIN D 25 HYDROXY (VIT D DEFICIENCY, FRACTURES): VITD: 28.62 ng/mL — ABNORMAL LOW (ref 30.00–100.00)

## 2018-07-18 MED ORDER — BENZONATATE 100 MG PO CAPS: 100.0000 mg | ORAL_CAPSULE | Freq: Two times a day (BID) | ORAL | 0 refills | Status: DC | PRN

## 2018-07-18 MED ORDER — LISINOPRIL 10 MG PO TABS: 10.0000 mg | ORAL_TABLET | Freq: Every day | ORAL | 1 refills | Status: DC
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
## 2018-07-20 NOTE — Telephone Encounter (Signed)
Patient was seen on 07/08

## 2018-07-27 ENCOUNTER — Other Ambulatory Visit: Payer: Self-pay | Admitting: Family Medicine

## 2018-07-27 DIAGNOSIS — R059 Cough, unspecified: Secondary | ICD-10-CM

## 2018-07-27 DIAGNOSIS — R05 Cough: Secondary | ICD-10-CM

## 2018-07-31 ENCOUNTER — Other Ambulatory Visit: Payer: Self-pay | Admitting: Family Medicine

## 2018-08-03 ENCOUNTER — Ambulatory Visit: Payer: Medicare Other | Admitting: Family Medicine

## 2018-08-06 ENCOUNTER — Other Ambulatory Visit: Payer: Self-pay | Admitting: Family Medicine

## 2018-08-08 ENCOUNTER — Other Ambulatory Visit: Payer: Self-pay | Admitting: Family Medicine

## 2018-08-20 ENCOUNTER — Other Ambulatory Visit: Payer: Self-pay
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
## 2018-08-22 ENCOUNTER — Other Ambulatory Visit: Payer: Self-pay

## 2018-08-22 ENCOUNTER — Other Ambulatory Visit (HOSPITAL_COMMUNITY)
Admission: RE | Admit: 2018-08-22 | Discharge: 2018-08-22 | Disposition: A | Discharge status: Home / Self Care | Payer: Medicare Other | Source: Ambulatory Visit | Attending: Obstetrics and Gynecology | Admitting: Obstetrics and Gynecology

## 2018-08-22 ENCOUNTER — Ambulatory Visit (INDEPENDENT_AMBULATORY_CARE_PROVIDER_SITE_OTHER): Payer: Medicare Other | Admitting: Obstetrics and Gynecology

## 2018-08-22 ENCOUNTER — Encounter: Payer: Self-pay | Admitting: Obstetrics and Gynecology

## 2018-08-22 VITALS — BP 142/82 | HR 100 | Resp 20 | Ht 61.5 in | Wt 140.8 lb

## 2018-08-22 DIAGNOSIS — Z01419 Encounter for gynecological examination (general) (routine) without abnormal findings: Secondary | ICD-10-CM

## 2018-08-22 NOTE — Patient Instructions (Signed)

## 2018-08-24 LAB — CYTOLOGY - PAP: Diagnosis: NEGATIVE

## 2018-08-30 ENCOUNTER — Encounter: Payer: Self-pay | Admitting: Obstetrics and Gynecology

## 2018-09-23 ENCOUNTER — Telehealth: Payer: Self-pay | Admitting: Obstetrics and Gynecology

## 2018-09-23 NOTE — Telephone Encounter (Signed)
Please contact patient with her final bone density result from [facility] for 08/30/18.  She has osteopenia now and not osteoporosis.  She previously had osteoporosis of the right hip.  She currently has osteopenia of the bilateral hips and the spine.   Some of the differences in the measurements may be due to the use of a different bone density machine.    I would recommend weight bearing exercise and daily intake of calcium 1200 mg and vitamin D 2000 IU.  I would plan for her to repeat her BMD in 2 years.

## 2018-09-27 NOTE — Telephone Encounter (Signed)
Spoke with patient and reviewed BMD results. Advised Osteopenia now and not osteoporosis. Advised to continue with weight bearing exercise and daily intake of calcium 1200mg  and vitamin D3 2000 IU. Repeat BMD in 2 years. Patient voiced understanding.

## 2018-09-30 ENCOUNTER — Other Ambulatory Visit: Payer: Self-pay | Admitting: Family Medicine

## 2018-09-30 DIAGNOSIS — R053 Chronic cough: Secondary | ICD-10-CM

## 2018-09-30 DIAGNOSIS — R05 Cough: Secondary | ICD-10-CM

## 2018-10-18 ENCOUNTER — Other Ambulatory Visit: Payer: Self-pay

## 2018-10-18 ENCOUNTER — Ambulatory Visit: Payer: Medicare Other | Admitting: Pulmonary Disease

## 2018-10-18 ENCOUNTER — Encounter: Payer: Self-pay | Admitting: Pulmonary Disease

## 2018-10-18 ENCOUNTER — Ambulatory Visit (INDEPENDENT_AMBULATORY_CARE_PROVIDER_SITE_OTHER): Payer: Medicare Other

## 2018-10-18 VITALS — BP 120/82 | HR 78 | Temp 97.2°F | Ht 61.5 in | Wt 140.0 lb

## 2018-10-18 DIAGNOSIS — R05 Cough: Secondary | ICD-10-CM

## 2018-10-18 DIAGNOSIS — R059 Cough, unspecified: Secondary | ICD-10-CM

## 2018-10-18 DIAGNOSIS — G4733 Obstructive sleep apnea (adult) (pediatric): Secondary | ICD-10-CM | POA: Diagnosis not present

## 2018-10-18 LAB — CBC WITH DIFFERENTIAL/PLATELET
Basophils Absolute: 0.1 10*3/uL (ref 0.0–0.1)
Basophils Relative: 0.7 % (ref 0.0–3.0)
Eosinophils Absolute: 0.2 10*3/uL (ref 0.0–0.7)
Eosinophils Relative: 2 % (ref 0.0–5.0)
HCT: 38.3 % (ref 36.0–46.0)
Hemoglobin: 12.8 g/dL (ref 12.0–15.0)
Lymphocytes Relative: 34.8 % (ref 12.0–46.0)
Lymphs Abs: 2.7 10*3/uL (ref 0.7–4.0)
MCHC: 33.5 g/dL (ref 30.0–36.0)
MCV: 92.7 fl (ref 78.0–100.0)
Monocytes Absolute: 0.6 10*3/uL (ref 0.1–1.0)
Monocytes Relative: 7.6 % (ref 3.0–12.0)
Neutro Abs: 4.3 10*3/uL (ref 1.4–7.7)
Neutrophils Relative %: 54.9 % (ref 43.0–77.0)
Platelets: 270 10*3/uL (ref 150.0–400.0)
RBC: 4.13 Mil/uL (ref 3.87–5.11)
RDW: 14.1 % (ref 11.5–15.5)
WBC: 7.8 10*3/uL (ref 4.0–10.5)

## 2018-10-18 MED ORDER — CHLORPHENIRAMINE MALEATE 4 MG PO TABS: 8.0000 mg | ORAL_TABLET | Freq: Two times a day (BID) | ORAL | 0 refills | Status: DC | PRN

## 2018-10-18 MED ORDER — AZELASTINE HCL 0.1 % NA SOLN: 2.0000 | Freq: Two times a day (BID) | NASAL | 12 refills | Status: DC
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
## 2018-10-18 NOTE — Patient Instructions (Signed)
Continue the Flonase nasal spray.  We will start you on another nasal spray called Astelin You will need chlorpheniramine 8 mg 3 times daily.  We will send in a prescription for this.  It is also available over-the-counter Increase Protonix to 40 mg twice daily  We will check CBC with differential, IgE, chest x-ray and schedule pulmonary function test We will also schedule a home sleep study for evaluation of sleep apnea  Follow-up in 2 to 4 weeks

## 2018-10-19 LAB — IGE: IgE (Immunoglobulin E), Serum: 5 kU/L (ref <=114)

## 2018-11-04 ENCOUNTER — Other Ambulatory Visit: Payer: Self-pay | Admitting: Family Medicine

## 2018-11-06 ENCOUNTER — Other Ambulatory Visit: Payer: Self-pay

## 2018-11-06 ENCOUNTER — Ambulatory Visit: Payer: Medicare Other

## 2018-11-06 DIAGNOSIS — G4733 Obstructive sleep apnea (adult) (pediatric): Secondary | ICD-10-CM | POA: Diagnosis not present

## 2018-11-12 DIAGNOSIS — G4733 Obstructive sleep apnea (adult) (pediatric): Secondary | ICD-10-CM | POA: Diagnosis not present

## 2018-11-22 ENCOUNTER — Other Ambulatory Visit: Payer: Self-pay | Admitting: Family Medicine

## 2018-11-28 ENCOUNTER — Encounter: Payer: Self-pay | Admitting: Family Medicine

## 2018-11-28 ENCOUNTER — Other Ambulatory Visit: Payer: Self-pay

## 2018-11-28 ENCOUNTER — Ambulatory Visit: Payer: Medicare Other | Admitting: Family Medicine

## 2018-11-28 VITALS — BP 120/80 | HR 92 | Temp 97.0°F | Ht 61.5 in | Wt 140.8 lb

## 2018-11-28 DIAGNOSIS — E119 Type 2 diabetes mellitus without complications: Secondary | ICD-10-CM

## 2018-11-28 DIAGNOSIS — R053 Chronic cough: Secondary | ICD-10-CM

## 2018-11-28 DIAGNOSIS — E1169 Type 2 diabetes mellitus with other specified complication: Secondary | ICD-10-CM

## 2018-11-28 DIAGNOSIS — R05 Cough: Secondary | ICD-10-CM

## 2018-11-28 DIAGNOSIS — E785 Hyperlipidemia, unspecified: Secondary | ICD-10-CM | POA: Diagnosis not present

## 2018-11-28 LAB — LIPID PANEL
Cholesterol: 141 mg/dL (ref 0–200)
HDL: 47.1 mg/dL (ref >39.00)
LDL Cholesterol: 67 mg/dL (ref 0–99)
NonHDL: 94.16
Total CHOL/HDL Ratio: 3
Triglycerides: 137 mg/dL (ref 0.0–149.0)
VLDL: 27.4 mg/dL (ref 0.0–40.0)

## 2018-11-28 LAB — HEMOGLOBIN A1C: Hgb A1c MFr Bld: 7.2 % — ABNORMAL HIGH (ref 4.6–6.5)

## 2018-11-28 NOTE — Progress Notes (Signed)
Stephanie Grimes DOB: [birth year] Encounter date: 11/28/2018  This is a 68 y.o. female who presents with Chief Complaint  Patient presents with  . Follow-up    patient presents to discuss lab test results    History of present illness: Cough is still there; had appointment with pulmonologist and they are working on some evaluation. Did testing for sleep apnea; hasn't heard about results yet. Next week doing airway test. Feels that everything is going to come back to throat. States something new is that she is getting things stuck in throat. Feels like pieces of pill are getting stuck in throat. Notes with food even. Even lettuce. Has had to quit eating salad. Notes with little things - like nuts. Not sure if things are really stuck, but triggers a coughing spell for her. Does the coughing at night, during day. Between and during meals. Really happening all the time. Just feels like a spasm and all feels like it is within her upper throat. Had a submandibular gland removed on left side of neck when she was 67 yrs old. Feels like the area that triggers cough is in the same place on the right side.   Has been taking vitamin d and did well with this.   Was trying to eat healthy but then food starting sticking more and then not eating as well with grandchildren around. Has been trying to walk more. Not every day because of school.     Allergies  Allergen Reactions  . Sulfa Antibiotics Swelling    Extremities swell up, no airway problem  . Omeprazole     diarrhea   Current Meds  Medication Sig  . atorvastatin (LIPITOR) 40 MG tablet TAKE 1 TABLET BY MOUTH DAILY  . azelastine (ASTELIN) 0.1 % nasal spray Place 2 sprays into both nostrils 2 (two) times daily. Use in each nostril as directed  . benzonatate (TESSALON) 100 MG capsule TAKE 1 CAPSULE(100 MG) BY MOUTH TWICE DAILY AS NEEDED FOR COUGH  . Calcium Carbonate-Vit D-Min (CALCIUM 1200 PO) Take by mouth daily.  . chlorpheniramine (CHLOR-TRIMETON)  4 MG tablet Take 2 tablets (8 mg total) by mouth 2 (two) times daily as needed for allergies.  Marland Kitchen denosumab (PROLIA) 60 MG/ML SOLN injection Inject 60 mg into the skin every 6 (six) months. Administer in upper arm, thigh, or abdomen  . fluticasone (FLONASE) 50 MCG/ACT nasal spray Place 2 sprays into the nose daily. (Patient taking differently: Place 2 sprays into the nose as needed. )  . ibuprofen (ADVIL,MOTRIN) 200 MG tablet Take 200 mg by mouth as needed for mild pain.   Marland Kitchen lisinopril (ZESTRIL) 10 MG tablet Take 1 tablet (10 mg total) by mouth daily.  . metFORMIN (GLUCOPHAGE-XR) 500 MG 24 hr tablet TAKE 2 TABLETS(1000 MG) BY MOUTH DAILY WITH BREAKFAST  . Multiple Vitamin (MULTIVITAMIN) capsule Take 1 capsule by mouth daily.    . pantoprazole (PROTONIX) 40 MG tablet TAKE 1 TABLET(40 MG) BY MOUTH DAILY    Review of Systems  Constitutional: Negative for chills, fatigue and fever.  Respiratory: Positive for cough. Negative for chest tightness, shortness of breath and wheezing.   Cardiovascular: Negative for chest pain, palpitations and leg swelling.    Objective:  BP 120/80 (BP Location: Left Arm, Patient Position: Sitting, Cuff Size: Normal)   Pulse 92   Temp (!) 97 F (36.1 C) (Temporal)   Ht 5' 1.5" (1.562 m)   Wt 140 lb 12.8 oz (63.9 kg)   LMP 01/10/1998   SpO2  97%   BMI 26.17 kg/m   Weight: 140 lb 12.8 oz (63.9 kg)   BP Readings from Last 3 Encounters:  11/28/18 120/80  10/18/18 120/82  08/22/18 (!) 142/82   Wt Readings from Last 3 Encounters:  11/28/18 140 lb 12.8 oz (63.9 kg)  10/18/18 140 lb (63.5 kg)  08/22/18 140 lb 12.8 oz (63.9 kg)    Physical Exam Constitutional:      General: She is not in acute distress.    Appearance: She is well-developed.  Cardiovascular:     Rate and Rhythm: Normal rate and regular rhythm.     Heart sounds: Normal heart sounds. No murmur. No friction rub.  Pulmonary:     Effort: Pulmonary effort is normal. No respiratory distress.      Breath sounds: Normal breath sounds. No wheezing or rales.  Musculoskeletal:     Right lower leg: No edema.     Left lower leg: No edema.  Neurological:     Mental Status: She is alert and oriented to person, place, and time.  Psychiatric:        Behavior: Behavior normal.     Assessment/Plan  1. Diabetes mellitus without complication ([place]) - Hemoglobin A1c; Future  2. Hyperlipidemia associated with type 2 diabetes mellitus ([place]) - Lipid panel; Future  3. Chronic cough Following with pulmonology now. Further eval pending results today.      Consider further imaging of neck if pulmonology eval negative; and consider GI referral for endoscopy  Return for pending results from [place].   Micheline Rough, MD

## 2018-11-28 NOTE — Addendum Note (Signed)
Addended by: Suzette Battiest on: 11/28/2018 09:53 AM   Modules accepted: Orders

## 2018-11-29 NOTE — Addendum Note (Signed)
Addended by: Agnes Lawrence on: 11/29/2018 03:40 PM   Modules accepted: Orders

## 2018-12-01 ENCOUNTER — Other Ambulatory Visit (HOSPITAL_COMMUNITY)
Admission: RE | Admit: 2018-12-01 | Discharge: 2018-12-01 | Disposition: A | Discharge status: Home / Self Care | Payer: Medicare Other | Source: Ambulatory Visit | Attending: Pulmonary Disease | Admitting: Pulmonary Disease

## 2018-12-01 DIAGNOSIS — Z20828 Contact with and (suspected) exposure to other viral communicable diseases: Secondary | ICD-10-CM | POA: Insufficient documentation

## 2018-12-01 DIAGNOSIS — Z01812 Encounter for preprocedural laboratory examination: Secondary | ICD-10-CM | POA: Diagnosis present

## 2018-12-03 LAB — NOVEL CORONAVIRUS, NAA (HOSP ORDER, SEND-OUT TO REF LAB; TAT 18-24 HRS): SARS-CoV-2, NAA: NOT DETECTED

## 2018-12-04 ENCOUNTER — Ambulatory Visit (INDEPENDENT_AMBULATORY_CARE_PROVIDER_SITE_OTHER): Payer: Medicare Other | Admitting: Pulmonary Disease

## 2018-12-04 ENCOUNTER — Other Ambulatory Visit: Payer: Self-pay

## 2018-12-04 DIAGNOSIS — R05 Cough: Secondary | ICD-10-CM

## 2018-12-04 DIAGNOSIS — R059 Cough, unspecified: Secondary | ICD-10-CM

## 2018-12-04 LAB — PULMONARY FUNCTION TEST
DL/VA % pred: 137 %
DL/VA: 5.78 ml/min/mmHg/L
DLCO cor % pred: 93 %
DLCO cor: 17.17 ml/min/mmHg
DLCO unc % pred: 92 %
DLCO unc: 16.85 ml/min/mmHg
FEF 25-75 Post: 1.34 L/sec
FEF 25-75 Pre: 1.13 L/sec
FEF2575-%Change-Post: 19 %
FEF2575-%Pred-Post: 71 %
FEF2575-%Pred-Pre: 60 %
FEV1-%Change-Post: 1 %
FEV1-%Pred-Post: 80 %
FEV1-%Pred-Pre: 79 %
FEV1-Post: 1.72 L
FEV1-Pre: 1.7 L
FEV1FVC-%Change-Post: -3 %
FEV1FVC-%Pred-Pre: 92 %
FEV6-%Change-Post: 2 %
FEV6-%Pred-Post: 91 %
FEV6-%Pred-Pre: 89 %
FEV6-Post: 2.47 L
FEV6-Pre: 2.41 L
FEV6FVC-%Pred-Post: 104 %
FEV6FVC-%Pred-Pre: 104 %
FVC-%Change-Post: 4 %
FVC-%Pred-Post: 89 %
FVC-%Pred-Pre: 85 %
FVC-Post: 2.53 L
FVC-Pre: 2.42 L
Post FEV1/FVC ratio: 68 %
Post FEV6/FVC ratio: 100 %
Pre FEV1/FVC ratio: 70 %
Pre FEV6/FVC Ratio: 100 %
RV % pred: 76 %
RV: 1.57 L
TLC % pred: 82 %
TLC: 3.92 L

## 2018-12-04 NOTE — Progress Notes (Signed)
Full PFT performed today. °

## 2018-12-12 ENCOUNTER — Encounter: Payer: Self-pay | Admitting: Pulmonary Disease

## 2018-12-12 ENCOUNTER — Other Ambulatory Visit: Payer: Self-pay

## 2018-12-12 ENCOUNTER — Ambulatory Visit (INDEPENDENT_AMBULATORY_CARE_PROVIDER_SITE_OTHER): Payer: Medicare Other | Admitting: Pulmonary Disease

## 2018-12-12 DIAGNOSIS — G4733 Obstructive sleep apnea (adult) (pediatric): Secondary | ICD-10-CM | POA: Diagnosis not present

## 2018-12-12 DIAGNOSIS — R059 Cough, unspecified: Secondary | ICD-10-CM

## 2018-12-12 DIAGNOSIS — R05 Cough: Secondary | ICD-10-CM | POA: Diagnosis not present

## 2018-12-12 MED ORDER — PANTOPRAZOLE SODIUM 40 MG PO TBEC: DELAYED_RELEASE_TABLET | ORAL | 5 refills | Status: DC

## 2018-12-12 NOTE — Patient Instructions (Addendum)
I have reviewed your pulmonary function test which shows very minimal changes Chest x-ray is normal Your sleep study shows mild sleep apnea We will continue with the Protonix twice daily, nose spray and antihistamine We will start you on CPAP AutoSet 5-15 Reevaluate in 3 months with download.

## 2018-12-14 NOTE — Progress Notes (Signed)
Virtual Visit via Telephone Note  I connected with Stephanie Grimes on 12/14/18 at  8:30 AM EST by telephone and verified that I am speaking with the correct person using two identifiers.  Location: Patient: Home Provider: [facility], [place], [place]   I discussed the limitations, risks, security and privacy concerns of performing an evaluation and management service by telephone and the availability of in person appointments. I also discussed with the patient that there may be a patient responsible charge related to this service. The patient expressed understanding and agreed to proceed.   History of Present Illness: Follow-up for chronic cough  Stephanie Grimes is a non-smoker with history of seasonal allergies, diabetes, GERD.  Referred for evaluation of cough   She has had cough for the last 20 to 30 years with worsening over the past year.  Cough is paroxysmal, nonproductive.  Exacerbated by talking, strong smells.  No associated dyspnea, wheezing, fevers, chills. Previously evaluated by Dr. Lenna Gilford, pulmonary, GI, ENT and told she had acid reflux.  She has not responded trial of acid medication or inhalers.  EGD in 2015 was normal Recently started on lisinopril in July 2020 but her cough has preceded this by many years.   Reports increasing snoring and daytime somnolence for the past 1 to 2 years    Observations/Objective: Here for follow-up of chronic cough States that her cough is at baseline with no change She was started on Protonix but is using it once daily instead of twice daily as instructed  Chest x-ray 10/18/2018-acute lung abnormality  CBC 10/18/2018-WBC 7.8, eos 2%, absolute eosinophil count 156 IgE 10/18/2018-5  PFTs 12/04/2018 FVC 2.42 [85%], FEV1 1.70 [79%], F/F 70, TLC 3.92 [82%], DLCO 16.85 [72%] Minimal obstruction  Home sleep study 11/06/2018 Mild obstructive sleep apnea with mild oxygen desaturation -AHI 10.4, lowest O2 sat was 84%.  Assessment  and Plan: Chronic cough Suspect upper airway cough with postnasal drip, acid reflux Continue Flonase, Astelin, chlorphentermine Increase Protonix to 40 twice daily. Work-up so far has been unremarkable except for minimal obstruction on PFTs.  If her cough continues and we can give an empiric trial of inhalers  Sleep apnea Study shows mild apnea with desats. Untreated OSA can cause ongoing cough Start AutoSet CPAP  Follow Up Instructions: Increase Protonix to 40 twice daily Continue steroid nasal spray, antihistamine Start AutoSet CPAP 5-50   I discussed the assessment and treatment plan with the patient. The patient was provided an opportunity to ask questions and all were answered. The patient agreed with the plan and demonstrated an understanding of the instructions.   The patient was advised to call back or seek an in-person evaluation if the symptoms worsen or if the condition fails to improve as anticipated.  I provided 25 minutes of non-face-to-face time during this encounter.   Marshell Garfinkel MD [facility] 12/14/2018, 9:23 AM

## 2019-01-11 DIAGNOSIS — M7071 Other bursitis of hip, right hip: Secondary | ICD-10-CM

## 2019-01-11 HISTORY — DX: Other bursitis of hip, right hip: M70.71

## 2019-01-22 ENCOUNTER — Other Ambulatory Visit: Payer: Self-pay | Admitting: Family Medicine

## 2019-01-22 DIAGNOSIS — I10 Essential (primary) hypertension: Secondary | ICD-10-CM

## 2019-01-25 DIAGNOSIS — G4710 Hypersomnia, unspecified: Secondary | ICD-10-CM | POA: Diagnosis not present

## 2019-01-25 DIAGNOSIS — I10 Essential (primary) hypertension: Secondary | ICD-10-CM | POA: Diagnosis not present

## 2019-01-25 DIAGNOSIS — G4733 Obstructive sleep apnea (adult) (pediatric): Secondary | ICD-10-CM | POA: Diagnosis not present

## 2019-02-22 ENCOUNTER — Other Ambulatory Visit: Payer: Self-pay | Admitting: Family Medicine

## 2019-02-25 DIAGNOSIS — I10 Essential (primary) hypertension: Secondary | ICD-10-CM | POA: Diagnosis not present

## 2019-02-25 DIAGNOSIS — G4733 Obstructive sleep apnea (adult) (pediatric): Secondary | ICD-10-CM | POA: Diagnosis not present

## 2019-02-25 DIAGNOSIS — G4710 Hypersomnia, unspecified: Secondary | ICD-10-CM | POA: Diagnosis not present

## 2019-02-27 DIAGNOSIS — H5069 Other mechanical strabismus: Secondary | ICD-10-CM | POA: Diagnosis not present

## 2019-02-27 DIAGNOSIS — H5021 Vertical strabismus, right eye: Secondary | ICD-10-CM | POA: Diagnosis not present

## 2019-02-27 DIAGNOSIS — H5022 Vertical strabismus, left eye: Secondary | ICD-10-CM | POA: Diagnosis not present

## 2019-02-27 DIAGNOSIS — H2513 Age-related nuclear cataract, bilateral: Secondary | ICD-10-CM | POA: Diagnosis not present

## 2019-02-27 DIAGNOSIS — E119 Type 2 diabetes mellitus without complications: Secondary | ICD-10-CM | POA: Diagnosis not present

## 2019-02-27 DIAGNOSIS — H5213 Myopia, bilateral: Secondary | ICD-10-CM | POA: Diagnosis not present

## 2019-02-27 DIAGNOSIS — H524 Presbyopia: Secondary | ICD-10-CM | POA: Diagnosis not present

## 2019-02-28 DIAGNOSIS — G4733 Obstructive sleep apnea (adult) (pediatric): Secondary | ICD-10-CM | POA: Diagnosis not present

## 2019-02-28 DIAGNOSIS — I10 Essential (primary) hypertension: Secondary | ICD-10-CM | POA: Diagnosis not present

## 2019-02-28 DIAGNOSIS — G4710 Hypersomnia, unspecified: Secondary | ICD-10-CM | POA: Diagnosis not present

## 2019-03-01 ENCOUNTER — Other Ambulatory Visit: Payer: Self-pay

## 2019-03-04 ENCOUNTER — Other Ambulatory Visit (INDEPENDENT_AMBULATORY_CARE_PROVIDER_SITE_OTHER): Payer: Medicare PPO

## 2019-03-04 ENCOUNTER — Other Ambulatory Visit: Payer: Self-pay

## 2019-03-04 DIAGNOSIS — E119 Type 2 diabetes mellitus without complications: Secondary | ICD-10-CM | POA: Diagnosis not present

## 2019-03-04 LAB — HEMOGLOBIN A1C: Hgb A1c MFr Bld: 7.3 % — ABNORMAL HIGH (ref 4.6–6.5)

## 2019-03-07 ENCOUNTER — Encounter: Payer: Self-pay | Admitting: Pulmonary Disease

## 2019-03-07 ENCOUNTER — Ambulatory Visit: Payer: Medicare Other | Admitting: Pulmonary Disease

## 2019-03-07 ENCOUNTER — Other Ambulatory Visit: Payer: Self-pay

## 2019-03-07 VITALS — BP 120/80 | HR 87 | Temp 97.3°F | Ht 62.0 in | Wt 144.2 lb

## 2019-03-07 DIAGNOSIS — R059 Cough, unspecified: Secondary | ICD-10-CM

## 2019-03-07 DIAGNOSIS — R05 Cough: Secondary | ICD-10-CM | POA: Diagnosis not present

## 2019-03-07 NOTE — Patient Instructions (Addendum)
We will refer you to an Dr. Carol Ada, ENT doctor at [facility] for evaluation of your ongoing cough Continue the CPAP  Follow-up in 3 to 4 months.

## 2019-03-07 NOTE — Progress Notes (Signed)
Stephanie Grimes    XN:[id number]    October 17, 1950  Primary Care Physician:Koberlein, Steele Berg, MD  Referring Physician: Caren Macadam, [place],  [place] [address]  Chief complaint: Follow-up for for chronic cough  HPI: Stephanie Grimes is a non-smoker with history of seasonal allergies, diabetes, GERD.  Referred for evaluation of cough  She has had cough for the last 20 to 30 years with worsening over the past year.  Cough is paroxysmal, nonproductive.  Exacerbated by talking, strong smells.  No associated dyspnea, wheezing, fevers, chills. Previously evaluated by Dr. Lenna Gilford, pulmonary, GI, ENT and told she had acid reflux.  She has not responded trial of acid medication or inhalers.  EGD in 2015 was normal Recently started on lisinopril in July 2020 but her cough has preceded this by many years.  Reports increasing snoring and daytime somnolence for the past 1 to 2 years  Pets: Has cats.  No dogs, birds, farm animals Occupation: Retired Water engineer Exposures: No known exposures.  No mold, hot tub, Jacuzzi, down pillows or comforters Smoking history: Never smoked Travel history: No significant travel history Relevant family history: Mother died of lung cancer.  She is a non-smoker  Interim history:. Diagnosed with mild sleep apnea.  Started on AutoSet CPAP which she is tolerating okay Continues to have significant terms with cough  Protonix increased to 40 mg twice daily at last visit with no improvement  Complains of food getting stuck at the back of her throat for the past few months.  Outpatient Encounter Medications as of 03/07/2019  Medication Sig  . atorvastatin (LIPITOR) 40 MG tablet TAKE 1 TABLET BY MOUTH DAILY  . azelastine (ASTELIN) 0.1 % nasal spray Place 2 sprays into both nostrils 2 (two) times daily. Use in each nostril as directed  . benzonatate (TESSALON) 100 MG capsule TAKE 1 CAPSULE(100 MG) BY MOUTH TWICE DAILY AS NEEDED FOR  COUGH  . Calcium Carbonate-Vit D-Min (CALCIUM 1200 PO) Take by mouth daily.  . chlorpheniramine (CHLOR-TRIMETON) 4 MG tablet Take 2 tablets (8 mg total) by mouth 2 (two) times daily as needed for allergies.  Marland Kitchen denosumab (PROLIA) 60 MG/ML SOLN injection Inject 60 mg into the skin every 6 (six) months. Administer in upper arm, thigh, or abdomen  . fluticasone (FLONASE) 50 MCG/ACT nasal spray Place 2 sprays into the nose daily. (Patient taking differently: Place 2 sprays into the nose as needed. )  . ibuprofen (ADVIL,MOTRIN) 200 MG tablet Take 200 mg by mouth as needed for mild pain.   Marland Kitchen lisinopril (ZESTRIL) 10 MG tablet TAKE 1 TABLET(10 MG) BY MOUTH DAILY  . metFORMIN (GLUCOPHAGE-XR) 500 MG 24 hr tablet TAKE 2 TABLETS(1000 MG) BY MOUTH DAILY WITH BREAKFAST  . Multiple Vitamin (MULTIVITAMIN) capsule Take 1 capsule by mouth daily.    . pantoprazole (PROTONIX) 40 MG tablet TAKE 1 TABLET(40 MG) BY MOUTH TWICE DAILY   No facility-administered encounter medications on file as of 03/07/2019.   Physical Exam: Blood pressure 120/80, pulse 87, temperature (!) 97.3 F (36.3 C), temperature source Temporal, height 5\' 2"  (1.575 m), weight 144 lb 3.2 oz (65.4 kg), last menstrual period 01/10/1998, SpO2 99 %. Gen:      No acute distress HEENT:  EOMI, sclera anicteric Neck:     No masses; no thyromegaly Lungs:    Clear to auscultation bilaterally; normal respiratory effort CV:         Regular rate and rhythm; no  murmurs Abd:      + bowel sounds; soft, non-tender; no palpable masses, no distension Ext:    No edema; adequate peripheral perfusion Skin:      Warm and dry; no rash Neuro: alert and oriented x 3 Psych: normal mood and affect  Data Reviewed: Imaging: Chest x-ray 07/05/2018-no active cardiopulmonary disease.  I have reviewed the images personally. Chest x-ray 10/18/2018-acute lung abnormality  PFTs  12/04/2018 FVC 2.42 [85%], FEV1 1.70 [79%], F/F 70, TLC 3.92 [82%], DLCO 16.85 [72%] Minimal  obstruction  Labs: CBC 07/18/2018-WBC 8.9, eos 1.3%, absolute eosinophil count 116 CBC 10/18/2018-WBC 7.8, eos 2%, absolute eosinophil count 156 IgE 10/18/2018-5  Sleep: Home sleep study 11/06/2018 Mild obstructive sleep apnea with mild oxygen desaturation -AHI 10.4, lowest O2 sat was 84%.  Assessment:  Chronic cough Suspect upper airway cough with postnasal drip, acid reflux Has not responded to treatment for postnasal drip, acid reflux with PPI twice daily Work-up so far has been unremarkable except for minimal obstruction on PFTs.    She is on lisinopril but her cough has preceded her symptoms by many years. Refer to Dr. Joya Gaskins, ENT at [facility] for evaluation of ongoing refractory cough and dysphagia  Sleep apnea On auto CPAP. Download reviewed with good compliance  Plan/Recommendations: - Continue Flonase, Astelin, chlorpheniramine - Protonix to 40 twice daily - ENT referral at [facility] MD [facility] 03/07/2019, 10:33 AM  CC: Caren Macadam, MD

## 2019-03-19 DIAGNOSIS — J383 Other diseases of vocal cords: Secondary | ICD-10-CM | POA: Diagnosis not present

## 2019-03-19 DIAGNOSIS — J384 Edema of larynx: Secondary | ICD-10-CM | POA: Diagnosis not present

## 2019-03-19 DIAGNOSIS — R05 Cough: Secondary | ICD-10-CM | POA: Diagnosis not present

## 2019-03-25 DIAGNOSIS — G4710 Hypersomnia, unspecified: Secondary | ICD-10-CM | POA: Diagnosis not present

## 2019-03-25 DIAGNOSIS — I10 Essential (primary) hypertension: Secondary | ICD-10-CM | POA: Diagnosis not present

## 2019-03-25 DIAGNOSIS — G4733 Obstructive sleep apnea (adult) (pediatric): Secondary | ICD-10-CM | POA: Diagnosis not present

## 2019-04-05 DIAGNOSIS — I10 Essential (primary) hypertension: Secondary | ICD-10-CM | POA: Diagnosis not present

## 2019-04-05 DIAGNOSIS — G4733 Obstructive sleep apnea (adult) (pediatric): Secondary | ICD-10-CM | POA: Diagnosis not present

## 2019-04-05 DIAGNOSIS — G4710 Hypersomnia, unspecified: Secondary | ICD-10-CM | POA: Diagnosis not present

## 2019-04-15 DIAGNOSIS — R490 Dysphonia: Secondary | ICD-10-CM | POA: Diagnosis not present

## 2019-04-15 DIAGNOSIS — R05 Cough: Secondary | ICD-10-CM | POA: Diagnosis not present

## 2019-04-15 DIAGNOSIS — J383 Other diseases of vocal cords: Secondary | ICD-10-CM | POA: Diagnosis not present

## 2019-04-25 DIAGNOSIS — I10 Essential (primary) hypertension: Secondary | ICD-10-CM | POA: Diagnosis not present

## 2019-04-25 DIAGNOSIS — G4710 Hypersomnia, unspecified: Secondary | ICD-10-CM | POA: Diagnosis not present

## 2019-04-25 DIAGNOSIS — G4733 Obstructive sleep apnea (adult) (pediatric): Secondary | ICD-10-CM | POA: Diagnosis not present

## 2019-04-26 ENCOUNTER — Other Ambulatory Visit: Payer: Self-pay | Admitting: Family Medicine

## 2019-04-26 DIAGNOSIS — I10 Essential (primary) hypertension: Secondary | ICD-10-CM

## 2019-05-04 ENCOUNTER — Other Ambulatory Visit: Payer: Self-pay | Admitting: Family Medicine

## 2019-05-20 DIAGNOSIS — J383 Other diseases of vocal cords: Secondary | ICD-10-CM | POA: Diagnosis not present

## 2019-05-25 DIAGNOSIS — G4710 Hypersomnia, unspecified: Secondary | ICD-10-CM | POA: Diagnosis not present

## 2019-05-25 DIAGNOSIS — I10 Essential (primary) hypertension: Secondary | ICD-10-CM | POA: Diagnosis not present

## 2019-05-25 DIAGNOSIS — G4733 Obstructive sleep apnea (adult) (pediatric): Secondary | ICD-10-CM | POA: Diagnosis not present

## 2019-05-31 ENCOUNTER — Other Ambulatory Visit: Payer: Self-pay

## 2019-06-03 ENCOUNTER — Ambulatory Visit (INDEPENDENT_AMBULATORY_CARE_PROVIDER_SITE_OTHER): Payer: Medicare PPO | Admitting: Family Medicine

## 2019-06-03 ENCOUNTER — Encounter: Payer: Self-pay | Admitting: Family Medicine

## 2019-06-03 VITALS — BP 120/80 | HR 94 | Temp 97.2°F | Ht 62.0 in | Wt 147.1 lb

## 2019-06-03 DIAGNOSIS — E119 Type 2 diabetes mellitus without complications: Secondary | ICD-10-CM | POA: Diagnosis not present

## 2019-06-03 DIAGNOSIS — E1169 Type 2 diabetes mellitus with other specified complication: Secondary | ICD-10-CM | POA: Diagnosis not present

## 2019-06-03 DIAGNOSIS — E785 Hyperlipidemia, unspecified: Secondary | ICD-10-CM

## 2019-06-03 DIAGNOSIS — J384 Edema of larynx: Secondary | ICD-10-CM

## 2019-06-03 DIAGNOSIS — I10 Essential (primary) hypertension: Secondary | ICD-10-CM

## 2019-06-03 DIAGNOSIS — M818 Other osteoporosis without current pathological fracture: Secondary | ICD-10-CM | POA: Diagnosis not present

## 2019-06-03 DIAGNOSIS — M7061 Trochanteric bursitis, right hip: Secondary | ICD-10-CM | POA: Diagnosis not present

## 2019-06-03 DIAGNOSIS — K219 Gastro-esophageal reflux disease without esophagitis: Secondary | ICD-10-CM

## 2019-06-03 DIAGNOSIS — R252 Cramp and spasm: Secondary | ICD-10-CM

## 2019-06-03 LAB — MICROALBUMIN / CREATININE URINE RATIO
Creatinine,U: 97.4 mg/dL
Microalb Creat Ratio: 0.7 mg/g (ref 0.0–30.0)
Microalb, Ur: <0.7 mg/dL (ref 0.0–1.9)

## 2019-06-03 LAB — COMPREHENSIVE METABOLIC PANEL
ALT: 31 U/L (ref 0–35)
AST: 18 U/L (ref 0–37)
Albumin: 4.3 g/dL (ref 3.5–5.2)
Alkaline Phosphatase: 69 U/L (ref 39–117)
BUN: 18 mg/dL (ref 6–23)
CO2: 31 mEq/L (ref 19–32)
Calcium: 9.5 mg/dL (ref 8.4–10.5)
Chloride: 103 mEq/L (ref 96–112)
Creatinine, Ser: 0.79 mg/dL (ref 0.40–1.20)
GFR: 72.2 mL/min (ref >60.00)
Glucose, Bld: 157 mg/dL — ABNORMAL HIGH (ref 70–99)
Potassium: 4.8 mEq/L (ref 3.5–5.1)
Sodium: 142 mEq/L (ref 135–145)
Total Bilirubin: 0.6 mg/dL (ref 0.2–1.2)
Total Protein: 6.5 g/dL (ref 6.0–8.3)

## 2019-06-03 LAB — LIPID PANEL
Cholesterol: 158 mg/dL (ref 0–200)
HDL: 44.8 mg/dL (ref >39.00)
LDL Cholesterol: 89 mg/dL (ref 0–99)
NonHDL: 112.88
Total CHOL/HDL Ratio: 4
Triglycerides: 119 mg/dL (ref 0.0–149.0)
VLDL: 23.8 mg/dL (ref 0.0–40.0)

## 2019-06-03 LAB — CBC WITH DIFFERENTIAL/PLATELET
Basophils Absolute: 0 10*3/uL (ref 0.0–0.1)
Basophils Relative: 0.5 % (ref 0.0–3.0)
Eosinophils Absolute: 0.2 10*3/uL (ref 0.0–0.7)
Eosinophils Relative: 2.3 % (ref 0.0–5.0)
HCT: 35.4 % — ABNORMAL LOW (ref 36.0–46.0)
Hemoglobin: 11.8 g/dL — ABNORMAL LOW (ref 12.0–15.0)
Lymphocytes Relative: 33.4 % (ref 12.0–46.0)
Lymphs Abs: 2.3 10*3/uL (ref 0.7–4.0)
MCHC: 33.5 g/dL (ref 30.0–36.0)
MCV: 92.3 fl (ref 78.0–100.0)
Monocytes Absolute: 0.5 10*3/uL (ref 0.1–1.0)
Monocytes Relative: 7.3 % (ref 3.0–12.0)
Neutro Abs: 3.9 10*3/uL (ref 1.4–7.7)
Neutrophils Relative %: 56.5 % (ref 43.0–77.0)
Platelets: 254 10*3/uL (ref 150.0–400.0)
RBC: 3.83 Mil/uL — ABNORMAL LOW (ref 3.87–5.11)
RDW: 14.2 % (ref 11.5–15.5)
WBC: 7 10*3/uL (ref 4.0–10.5)

## 2019-06-03 LAB — VITAMIN D 25 HYDROXY (VIT D DEFICIENCY, FRACTURES): VITD: 56.08 ng/mL (ref 30.00–100.00)

## 2019-06-03 LAB — HEMOGLOBIN A1C: Hgb A1c MFr Bld: 7.7 % — ABNORMAL HIGH (ref 4.6–6.5)

## 2019-06-03 NOTE — Progress Notes (Signed)
Stephanie Grimes DOB: 1950-02-25 Encounter date: 06/03/2019  This is a 69 y.o. female who presents with Chief Complaint  Patient presents with  . Follow-up    History of present illness:   DMII: states that she knows she hasn't been doing well with her diet. Has been focused on cough. Has been having trouble with exercise. Has had some hip pain. Has arthritis in hands and feet. Has been trying to get up to 3 miles, but can't do it. This hip has bothered her for her whole life. Gets some turning in with her right leg.  Taking metformin 2 tablets daily.   [id number]; doing well with this.  Chronic cough: Saw pulmonology. Had evaluation. Has been a journey for her, but feels like she is doing well. Was told there was nothing pulmonary going on. Put on machine for mild sleep apnea. Thought it was laryngeal issue. Was sent to ENT at [facility] her in addition to acid reflux, she has some nerve irritation, laryngeal edema. She has been started on gabapentin and nortriptyline and referred to SLP. Has been working on these exercises. Does feel like it is helping her, although still coughing. Has been trying to follow reflux diet, but it conflicts with diabetic diet somewhat. She has had a hard time with trying to keep track of all of this and also helping grandchildren with school.   Taking protonix for acid reflux.  Osteoporosis: on prolia.   Allergies  Allergen Reactions  . Sulfa Antibiotics Swelling    Extremities swell up, no airway problem  . Omeprazole     diarrhea   Current Meds  Medication Sig  . atorvastatin (LIPITOR) 40 MG tablet TAKE 1 TABLET BY MOUTH DAILY  . azelastine (ASTELIN) 0.1 % nasal spray Place 2 sprays into both nostrils 2 (two) times daily. Use in each nostril as directed  . benzonatate (TESSALON) 100 MG capsule TAKE 1 CAPSULE(100 MG) BY MOUTH TWICE DAILY AS NEEDED FOR COUGH  . Calcium Carbonate-Vit D-Min (CALCIUM 1200 PO) Take by mouth daily.  . chlorpheniramine  (CHLOR-TRIMETON) 4 MG tablet Take 2 tablets (8 mg total) by mouth 2 (two) times daily as needed for allergies.  Marland Kitchen denosumab (PROLIA) 60 MG/ML SOLN injection Inject 60 mg into the skin every 6 (six) months. Administer in upper arm, thigh, or abdomen  . fluticasone (FLONASE) 50 MCG/ACT nasal spray Place 2 sprays into the nose daily. (Patient taking differently: Place 2 sprays into the nose as needed. )  . gabapentin (NEURONTIN) 100 MG capsule Take 100 mg by mouth. Take 1 tablet morning and afternoon, 2 every night  . ibuprofen (ADVIL,MOTRIN) 200 MG tablet Take 200 mg by mouth as needed for mild pain.   Marland Kitchen lisinopril (ZESTRIL) 10 MG tablet TAKE 1 TABLET(10 MG) BY MOUTH DAILY  . metFORMIN (GLUCOPHAGE-XR) 500 MG 24 hr tablet TAKE 2 TABLETS(1000 MG) BY MOUTH DAILY WITH BREAKFAST  . Multiple Vitamin (MULTIVITAMIN) capsule Take 1 capsule by mouth daily.    . nortriptyline (PAMELOR) 10 MG capsule Take 20 mg by mouth at bedtime.   . pantoprazole (PROTONIX) 40 MG tablet TAKE 1 TABLET(40 MG) BY MOUTH TWICE DAILY    Review of Systems  Constitutional: Negative for chills, fatigue and fever.  Respiratory: Negative for cough (improving), chest tightness, shortness of breath and wheezing.   Cardiovascular: Negative for chest pain, palpitations and leg swelling.    Objective:  BP 120/80 (BP Location: Left Arm, Patient Position: Sitting, Cuff Size: Normal)   Pulse  94   Temp (!) 97.2 F (36.2 C) (Temporal)   Ht 5\' 2"  (1.575 m)   Wt 147 lb 1.6 oz (66.7 kg)   LMP 01/10/1998   BMI 26.90 kg/m   Weight: 147 lb 1.6 oz (66.7 kg)   BP Readings from Last 3 Encounters:  06/03/19 120/80  03/07/19 120/80  11/28/18 120/80   Wt Readings from Last 3 Encounters:  06/03/19 147 lb 1.6 oz (66.7 kg)  03/07/19 144 lb 3.2 oz (65.4 kg)  11/28/18 140 lb 12.8 oz (63.9 kg)    Physical Exam Constitutional:      General: She is not in acute distress.    Appearance: She is well-developed.  HENT:     Head: Normocephalic  and atraumatic.  Cardiovascular:     Rate and Rhythm: Normal rate and regular rhythm.     Heart sounds: Normal heart sounds. No murmur.  Pulmonary:     Effort: Pulmonary effort is normal.     Breath sounds: Normal breath sounds.  Abdominal:     General: Bowel sounds are normal. There is no distension.     Palpations: Abdomen is soft.     Tenderness: There is no abdominal tenderness. There is no guarding.  Skin:    General: Skin is warm and dry.     Comments: Sensory exam of the foot is normal, tested with the monofilament. Good pulses, no lesions or ulcers, good peripheral pulses.  Varicosities bilat lower extremities - feet, ankles  Psychiatric:        Judgment: Judgment normal.     Assessment/Plan  1. Diabetes mellitus without complication ([place]) We will check bloodwork today; she has not been following diet as well as she would like.  She would like to see nutrition since she is trying to balance low reflux diet with diabetic diet. - Hemoglobin A1c; Future - Microalbumin / creatinine urine ratio; Future - Ambulatory Referral to DSME/T; Future  2. Other osteoporosis, unspecified pathological fracture presence She is on Prolia.  She does work on weightbearing exercises regularly. - VITAMIN D 25 Hydroxy (Vit-D Deficiency, Fractures); Future  3. Laryngeal edema Following with speech-language pathology and ENT.  She is noting some improvement in decrease in her chronic cough.  4. Gastroesophageal reflux disease without esophagitis Taking the Protonix on a regular basis. - Ambulatory Referral to DSME/T; Future  5. Hyperlipidemia associated with type 2 diabetes mellitus ([place]) - Lipid panel; Future  6. Hypertension, unspecified type Well-controlled.  Continue current medications. - CBC with Differential/Platelet; Future - Comprehensive metabolic panel; Future   Return in about 6 months (around 12/04/2019).    Micheline Rough, MD

## 2019-06-03 NOTE — Patient Instructions (Signed)
Hip Bursitis Rehab Ask your health care provider which exercises are safe for you. Do exercises exactly as told by your health care provider and adjust them as directed. It is normal to feel mild stretching, pulling, tightness, or discomfort as you do these exercises. Stop right away if you feel sudden pain or your pain gets worse. Do not begin these exercises until told by your health care provider. Stretching exercise This exercise warms up your muscles and joints and improves the movement and flexibility of your hip. This exercise also helps to relieve pain and stiffness. Iliotibial band stretch An iliotibial band is a strong band of muscle tissue that runs from the outer side of your hip to the outer side of your thigh and knee. 1. Lie on your side with your left / right leg in the top position. 2. Bend your left / right knee and grab your ankle. Stretch out your bottom arm to help you balance. 3. Slowly bring your knee back so your thigh is behind your body. 4. Slowly lower your knee toward the floor until you feel a gentle stretch on the outside of your left / right thigh. If you do not feel a stretch and your knee will not fall farther, place the heel of your other foot on top of your knee and pull your knee down toward the floor with your foot. 5. Hold this position for __________ seconds. 6. Slowly return to the starting position. Repeat __________ times. Complete this exercise __________ times a day. Strengthening exercises These exercises build strength and endurance in your hip and pelvis. Endurance is the ability to use your muscles for a long time, even after they get tired. Bridge This exercise strengthens the muscles that move your thigh backward (hip extensors). 1. Lie on your back on a firm surface with your knees bent and your feet flat on the floor. 2. Tighten your buttocks muscles and lift your buttocks off the floor until your trunk is level with your thighs. ? Do not arch  your back. ? You should feel the muscles working in your buttocks and the back of your thighs. If you do not feel these muscles, slide your feet 1-2 inches (2.5-5 cm) farther away from your buttocks. ? If this exercise is too easy, try doing it with your arms crossed over your chest. 3. Hold this position for __________ seconds. 4. Slowly lower your hips to the starting position. 5. Let your muscles relax completely after each repetition. Repeat __________ times. Complete this exercise __________ times a day. Squats This exercise strengthens the muscles in front of your thigh and knee (quadriceps). 1. Stand in front of a table, with your feet and knees pointing straight ahead. You may rest your hands on the table for balance but not for support. 2. Slowly bend your knees and lower your hips like you are going to sit in a chair. ? Keep your weight over your heels, not over your toes. ? Keep your lower legs upright so they are parallel with the table legs. ? Do not let your hips go lower than your knees. ? Do not bend lower than told by your health care provider. ? If your hip pain increases, do not bend as low. 3. Hold the squat position for __________ seconds. 4. Slowly push with your legs to return to standing. Do not use your hands to pull yourself to standing. Repeat __________ times. Complete this exercise __________ times a day. Hip hike 1. Stand   sideways on a bottom step. Stand on your left / right leg with your other foot unsupported next to the step. You can hold on to the railing or wall for balance if needed. 2. Keep your knees straight and your torso square. Then lift your left / right hip up toward the ceiling. 3. Hold this position for __________ seconds. 4. Slowly let your left / right hip lower toward the floor, past the starting position. Your foot should get closer to the floor. Do not lean or bend your knees. Repeat __________ times. Complete this exercise __________ times a  day. Single leg stand 1. Without shoes, stand near a railing or in a doorway. You may hold on to the railing or door frame as needed for balance. 2. Squeeze your left / right buttock muscles, then lift up your other foot. ? Do not let your left / right hip push out to the side. ? It is helpful to stand in front of a mirror for this exercise so you can watch your hip. 3. Hold this position for __________ seconds. Repeat __________ times. Complete this exercise __________ times a day. This information is not intended to replace advice given to you by your health care provider. Make sure you discuss any questions you have with your health care provider. Document Revised: 04/23/2018 Document Reviewed: 04/23/2018 Elsevier Patient Education  2020 [employer] Inc.  

## 2019-06-05 LAB — MAGNESIUM, RBC: Magnesium RBC: 4.6 mg/dL (ref 4.0–6.4)

## 2019-06-05 LAB — EXTRA SPECIMEN

## 2019-06-17 ENCOUNTER — Other Ambulatory Visit: Payer: Self-pay | Admitting: *Deleted

## 2019-06-17 MED ORDER — PANTOPRAZOLE SODIUM 40 MG PO TBEC: DELAYED_RELEASE_TABLET | ORAL | 5 refills | Status: DC

## 2019-06-25 DIAGNOSIS — G4733 Obstructive sleep apnea (adult) (pediatric): Secondary | ICD-10-CM | POA: Diagnosis not present

## 2019-06-25 DIAGNOSIS — I10 Essential (primary) hypertension: Secondary | ICD-10-CM | POA: Diagnosis not present

## 2019-06-25 DIAGNOSIS — R05 Cough: Secondary | ICD-10-CM | POA: Diagnosis not present

## 2019-06-25 DIAGNOSIS — Z79899 Other long term (current) drug therapy: Secondary | ICD-10-CM | POA: Diagnosis not present

## 2019-06-25 DIAGNOSIS — G4710 Hypersomnia, unspecified: Secondary | ICD-10-CM | POA: Diagnosis not present

## 2019-07-02 ENCOUNTER — Other Ambulatory Visit: Payer: Self-pay

## 2019-07-02 ENCOUNTER — Encounter: Payer: Medicare PPO | Attending: Family Medicine | Admitting: Skilled Nursing Facility1

## 2019-07-02 ENCOUNTER — Encounter: Payer: Self-pay | Admitting: Skilled Nursing Facility1

## 2019-07-02 DIAGNOSIS — E119 Type 2 diabetes mellitus without complications: Secondary | ICD-10-CM

## 2019-07-02 NOTE — Patient Instructions (Addendum)
-  Avoid caffeine including coffee  -Avoid chocolate, lemon, lime, orange, onion, garlic, peppermint  -Avoid peanut butter crackers  -Avoid red sauce and tomatoes   -Eat smaller portions more often throughout the day: eating about every 3 hours   -Do not lay down or lounge within 3 hours of eating  -Avoid butter and oil beyond the serving size and fried foods  -Try alkaline water pH 8.8  -Continue to try whole what pasta try having with white pasta to better tolerate  -Chew until applesauce consistency, taking bites the size of a dime; sit up straight with your shoulders back when eating   -Aim to have non starchy vegetables 2 times a day 7 days a week

## 2019-07-02 NOTE — Progress Notes (Signed)
  Assessment:  Primary concerns today: Reflux/weight loss   Pt state she watches her grandaughter which is stressful. Pt state she is trying to be as active as she can. Pt states her downfall is pasta because it is so easy. Pt states she is following the diabetes diet and has a chronic cough which may be from reflux so now she wants to have a diet for that. Pt state she has a cough which is an inapropriate response to an environmental trigger. Pt states she has noticed tiny things like granola or blueberries skin gets caught in her throat.  A1C 7.7   MEDICATIONS: see list   DIETARY INTAKE:  Usual eating pattern includes 3 meals and 1 snacks per day.  Everyday foods include none stated.  Avoided foods include things that will get caught such as seeds.    24-hr recall:  B ( AM): oatmeal + dried cranberries plain low fat greek yogurt + fruit Snk ( AM):  L ( PM): cheese stick and [place] meat Snk ( PM): peanut butter crackers D ( PM): pasta Snk ( PM):  Beverages: coffee + alternative sugar + almond milk  Usual physical activity: some walking  Estimated energy needs: 1500 calories 170 g carbohydrates 112 g protein 42 g fat  Progress Towards Goal(s):  In progress.   Nutritional Diagnosis:  NB-1.1 Food and nutrition-related knowledge deficit As related to newly diagnosed GERD.  As evidenced by referral and 24 hr recall.    Intervention:  Nutrition counseling. Dietitian educated pt on controlled GERD through diet within the context of type 2 diabetes.   Goals: -Avoid caffeine including coffee -Avoid chocolate, lemon, lime, orange, onion, garlic, peppermint -Avoid peanut butter crackers -Avoid red sauce and tomatoes  -Eat smaller portions more often throughout the day: eating about every 3 hours  -Do not lay down or lounge within 3 hours of eating -Avoid butter and oil beyond the serving size and fried foods -Try alkaline water -Continue to try whole what pasta try having with  white pasta to better tolerate -Chew until applesauce consistency, taking bites the size of a dime; sit up straight with your shoulders back when eating  -Aim to have non starchy vegetables 2 times a day 7 days a week  Teaching Method Utilized:  Visual Auditory Hands on  Handouts given during visit include:  Detailed MyPlate  Barriers to learning/adherence to lifestyle change: none identified   Demonstrated degree of understanding via:  Teach Back   Monitoring/Evaluation:  Dietary intake, exercise, and body weight prn.

## 2019-07-04 DIAGNOSIS — G4733 Obstructive sleep apnea (adult) (pediatric): Secondary | ICD-10-CM | POA: Diagnosis not present

## 2019-07-25 DIAGNOSIS — I10 Essential (primary) hypertension: Secondary | ICD-10-CM | POA: Diagnosis not present

## 2019-07-25 DIAGNOSIS — G4710 Hypersomnia, unspecified: Secondary | ICD-10-CM | POA: Diagnosis not present

## 2019-07-25 DIAGNOSIS — G4733 Obstructive sleep apnea (adult) (pediatric): Secondary | ICD-10-CM | POA: Diagnosis not present

## 2019-07-27 ENCOUNTER — Other Ambulatory Visit: Payer: Self-pay | Admitting: Family Medicine

## 2019-08-24 ENCOUNTER — Other Ambulatory Visit: Payer: Self-pay | Admitting: Family Medicine

## 2019-08-25 DIAGNOSIS — I10 Essential (primary) hypertension: Secondary | ICD-10-CM | POA: Diagnosis not present

## 2019-08-25 DIAGNOSIS — G4733 Obstructive sleep apnea (adult) (pediatric): Secondary | ICD-10-CM | POA: Diagnosis not present

## 2019-08-25 DIAGNOSIS — G4710 Hypersomnia, unspecified: Secondary | ICD-10-CM | POA: Diagnosis not present

## 2019-09-25 DIAGNOSIS — I10 Essential (primary) hypertension: Secondary | ICD-10-CM | POA: Diagnosis not present

## 2019-09-25 DIAGNOSIS — G4733 Obstructive sleep apnea (adult) (pediatric): Secondary | ICD-10-CM | POA: Diagnosis not present

## 2019-09-25 DIAGNOSIS — G4710 Hypersomnia, unspecified: Secondary | ICD-10-CM | POA: Diagnosis not present

## 2019-10-04 DIAGNOSIS — G4733 Obstructive sleep apnea (adult) (pediatric): Secondary | ICD-10-CM | POA: Diagnosis not present

## 2019-10-04 DIAGNOSIS — G4710 Hypersomnia, unspecified: Secondary | ICD-10-CM | POA: Diagnosis not present

## 2019-10-04 DIAGNOSIS — I10 Essential (primary) hypertension: Secondary | ICD-10-CM | POA: Diagnosis not present
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
## 2019-10-09 ENCOUNTER — Other Ambulatory Visit: Payer: Self-pay

## 2019-10-09 ENCOUNTER — Encounter: Payer: Self-pay | Admitting: Obstetrics and Gynecology

## 2019-10-09 ENCOUNTER — Ambulatory Visit (INDEPENDENT_AMBULATORY_CARE_PROVIDER_SITE_OTHER): Payer: Medicare PPO | Admitting: Obstetrics and Gynecology

## 2019-10-09 VITALS — BP 138/80 | HR 100 | Resp 22 | Ht 61.5 in | Wt 146.0 lb

## 2019-10-09 DIAGNOSIS — M8580 Other specified disorders of bone density and structure, unspecified site: Secondary | ICD-10-CM | POA: Diagnosis not present

## 2019-10-09 DIAGNOSIS — N8110 Cystocele, unspecified: Secondary | ICD-10-CM

## 2019-10-09 DIAGNOSIS — Z01419 Encounter for gynecological examination (general) (routine) without abnormal findings: Secondary | ICD-10-CM | POA: Diagnosis not present

## 2019-10-09 NOTE — Patient Instructions (Signed)

## 2019-10-25 DIAGNOSIS — I10 Essential (primary) hypertension: Secondary | ICD-10-CM | POA: Diagnosis not present

## 2019-10-25 DIAGNOSIS — G4710 Hypersomnia, unspecified: Secondary | ICD-10-CM | POA: Diagnosis not present

## 2019-10-25 DIAGNOSIS — G4733 Obstructive sleep apnea (adult) (pediatric): Secondary | ICD-10-CM | POA: Diagnosis not present

## 2019-10-28 ENCOUNTER — Other Ambulatory Visit: Payer: Self-pay | Admitting: Family Medicine

## 2019-10-31 ENCOUNTER — Encounter: Payer: Self-pay | Admitting: Obstetrics and Gynecology

## 2019-10-31 DIAGNOSIS — Z1231 Encounter for screening mammogram for malignant neoplasm of breast: Secondary | ICD-10-CM | POA: Diagnosis not present

## 2019-11-25 DIAGNOSIS — G4710 Hypersomnia, unspecified: Secondary | ICD-10-CM | POA: Diagnosis not present

## 2019-11-25 DIAGNOSIS — I10 Essential (primary) hypertension: Secondary | ICD-10-CM | POA: Diagnosis not present

## 2019-11-25 DIAGNOSIS — G4733 Obstructive sleep apnea (adult) (pediatric): Secondary | ICD-10-CM | POA: Diagnosis not present

## 2019-12-04 ENCOUNTER — Encounter: Payer: Self-pay | Admitting: Family Medicine

## 2019-12-04 ENCOUNTER — Ambulatory Visit: Payer: Medicare PPO | Admitting: Family Medicine

## 2019-12-04 ENCOUNTER — Other Ambulatory Visit: Payer: Self-pay

## 2019-12-04 VITALS — BP 128/72 | HR 95 | Temp 98.2°F | Ht 61.5 in | Wt 145.6 lb

## 2019-12-04 DIAGNOSIS — E785 Hyperlipidemia, unspecified: Secondary | ICD-10-CM | POA: Diagnosis not present

## 2019-12-04 DIAGNOSIS — E1169 Type 2 diabetes mellitus with other specified complication: Secondary | ICD-10-CM | POA: Diagnosis not present

## 2019-12-04 DIAGNOSIS — E118 Type 2 diabetes mellitus with unspecified complications: Secondary | ICD-10-CM | POA: Diagnosis not present

## 2019-12-04 DIAGNOSIS — M818 Other osteoporosis without current pathological fracture: Secondary | ICD-10-CM

## 2019-12-04 DIAGNOSIS — K219 Gastro-esophageal reflux disease without esophagitis: Secondary | ICD-10-CM | POA: Diagnosis not present

## 2019-12-04 MED ORDER — LOSARTAN POTASSIUM 25 MG PO TABS: 25.0000 mg | ORAL_TABLET | Freq: Every day | ORAL | 1 refills | Status: DC

## 2019-12-04 NOTE — Patient Instructions (Signed)
Stop the lisinopril. Start losartan. Hoping this helps with cough some (even though cough started prior to medication)

## 2019-12-04 NOTE — Addendum Note (Signed)
Addended by: Janann Colonel on: 12/04/2019 10:17 AM   Modules accepted: Orders

## 2019-12-04 NOTE — Addendum Note (Signed)
Addended by: Nathanial Millman E on: 12/04/2019 11:20 AM   Modules accepted: Orders

## 2019-12-04 NOTE — Progress Notes (Signed)
Stephanie Grimes DOB: 14-Apr-1950 Encounter date: 12/04/2019  This is a 69 y.o. female who presents with Chief Complaint  Patient presents with  . Follow-up    History of present illness: Last visit with me was 06/03/2019.  Type 2 diabetes with complication -acid reflux, hyperlipidemia: She is on lisinopril for renal protection, Metformin 1000 mg daily  Hyperlipidemia: Lipitor 40 mg daily  Chronic cough: sometimes thinks she is doing well and then next day coughs like crazy. Can't pin down what is triggering the cough. Has been following with specialist at [facility] - thinks most is damaged nerve; some is gastro. Worse when nervous/anxious. Coughs in the car. Still coughing daily, but not always having spasms like she used to. Then nose starts running. She has been coughing prior to being on lisinopril.   GERD: Protonix 40 mg daily  Osteoporosis: no longer on Prolia because had improvement in hip osteoporosis. She is continue with vitamin d, calcium, exercise.   She saw Dr. Quincy Simmonds on 10/09/2019 for routine gyn care.  Pamelor 20 mg at bedtime - still takes this regularly along with the gabapentin to help with nerve/muscles in throat.   Was dx with sleep apnea and on machine for last year. Can't lay down with machine on because it doesn't fit right. Makes sleep more difficult for her.   Allergies  Allergen Reactions  . Sulfa Antibiotics Swelling    Extremities swell up, no airway problem  . Omeprazole     diarrhea   Current Meds  Medication Sig  . atorvastatin (LIPITOR) 40 MG tablet TAKE 1 TABLET BY MOUTH DAILY  . Calcium Carbonate-Vit D-Min (CALCIUM 1200 PO) Take by mouth daily.  . chlorpheniramine (CHLOR-TRIMETON) 4 MG tablet Take 2 tablets (8 mg total) by mouth 2 (two) times daily as needed for allergies.  . Cholecalciferol (VITAMIN D3) 125 MCG (5000 UT) TABS Take 1 tablet by mouth daily.  Marland Kitchen denosumab (PROLIA) 60 MG/ML SOLN injection Inject 60 mg into the skin every 6 (six) months.  Administer in upper arm, thigh, or abdomen  . fluticasone (FLONASE) 50 MCG/ACT nasal spray Place 2 sprays into the nose daily. (Patient taking differently: Place 2 sprays into the nose as needed. )  . gabapentin (NEURONTIN) 100 MG capsule Take 100 mg by mouth. Take 1 tablet morning and afternoon, 2 every night  . ibuprofen (ADVIL,MOTRIN) 200 MG tablet Take 200 mg by mouth as needed for mild pain.   Marland Kitchen lisinopril (ZESTRIL) 10 MG tablet TAKE 1 TABLET(10 MG) BY MOUTH DAILY  . metFORMIN (GLUCOPHAGE-XR) 500 MG 24 hr tablet TAKE 2 TABLETS(1000 MG) BY MOUTH DAILY WITH BREAKFAST  . Multiple Vitamin (MULTIVITAMIN) capsule Take 1 capsule by mouth daily.    . nortriptyline (PAMELOR) 10 MG capsule Take 20 mg by mouth at bedtime.   . pantoprazole (PROTONIX) 40 MG tablet TAKE 1 TABLET(40 MG) BY MOUTH TWICE DAILY    Review of Systems  Constitutional: Negative for chills, fatigue and fever.  Respiratory: Positive for cough (see hpi). Negative for chest tightness, shortness of breath and wheezing.   Cardiovascular: Negative for chest pain, palpitations and leg swelling.    Objective:  BP 128/72 (BP Location: Left Arm, Patient Position: Sitting, Cuff Size: Normal)   Pulse 95   Temp 98.2 F (36.8 C) (Oral)   Ht 5' 1.5" (1.562 m)   Wt 145 lb 9.6 oz (66 kg)   LMP 01/10/1998   BMI 27.07 kg/m   Weight: 145 lb 9.6 oz (66 kg)  BP Readings from Last 3 Encounters:  12/04/19 128/72  10/09/19 138/80  06/03/19 120/80   Wt Readings from Last 3 Encounters:  12/04/19 145 lb 9.6 oz (66 kg)  10/09/19 146 lb (66.2 kg)  07/02/19 147 lb 14.4 oz (67.1 kg)    Physical Exam Constitutional:      General: She is not in acute distress.    Appearance: She is well-developed.  HENT:     Head: Normocephalic and atraumatic.  Cardiovascular:     Rate and Rhythm: Normal rate and regular rhythm.     Heart sounds: Normal heart sounds. No murmur heard.   Pulmonary:     Effort: Pulmonary effort is normal.     Breath  sounds: Normal breath sounds.  Abdominal:     General: Bowel sounds are normal. There is no distension.     Palpations: Abdomen is soft.     Tenderness: There is no abdominal tenderness. There is no guarding.  Skin:    General: Skin is warm and dry.     Comments: Sensory exam of the foot is normal, tested with the monofilament. Good pulses, no lesions or ulcers, good peripheral pulses.  Psychiatric:        Judgment: Judgment normal.     Assessment/Plan  1. Type II diabetes mellitus with complication (HCC) Has been well controlled. Continue with healthier eating.  - losartan (COZAAR) 25 MG tablet; Take 1 tablet (25 mg total) by mouth daily.  Dispense: 90 tablet; Refill: 1 - CBC with Differential/Platelet; Future - Hemoglobin A1c; Future - HM DIABETES FOOT EXAM - Microalbumin / creatinine urine ratio; Future  2. Gastroesophageal reflux disease without esophagitis Continue with protonix 40mg  daily.  3. Hyperlipidemia associated with type 2 diabetes mellitus ([place]) lipitor 40mg ; will recheck. - Comprehensive metabolic panel; Future - Lipid panel; Future  4. Other osteoporosis, unspecified pathological fracture presence Continue with calcium and weight bearing exercise. - VITAMIN D 25 Hydroxy (Vit-D Deficiency, Fractures); Future   Return in about 6 months (around 06/02/2020) for physical exam.    Micheline Rough, MD

## 2019-12-09 ENCOUNTER — Other Ambulatory Visit (INDEPENDENT_AMBULATORY_CARE_PROVIDER_SITE_OTHER): Payer: Medicare PPO

## 2019-12-09 ENCOUNTER — Other Ambulatory Visit: Payer: Self-pay

## 2019-12-09 DIAGNOSIS — E118 Type 2 diabetes mellitus with unspecified complications: Secondary | ICD-10-CM

## 2019-12-09 DIAGNOSIS — M818 Other osteoporosis without current pathological fracture: Secondary | ICD-10-CM | POA: Diagnosis not present

## 2019-12-09 DIAGNOSIS — E785 Hyperlipidemia, unspecified: Secondary | ICD-10-CM

## 2019-12-09 DIAGNOSIS — D649 Anemia, unspecified: Secondary | ICD-10-CM | POA: Diagnosis not present

## 2019-12-09 DIAGNOSIS — E1169 Type 2 diabetes mellitus with other specified complication: Secondary | ICD-10-CM

## 2019-12-09 DIAGNOSIS — K219 Gastro-esophageal reflux disease without esophagitis: Secondary | ICD-10-CM

## 2019-12-11 ENCOUNTER — Other Ambulatory Visit: Payer: Self-pay | Admitting: Family Medicine

## 2019-12-11 DIAGNOSIS — E875 Hyperkalemia: Secondary | ICD-10-CM

## 2019-12-12 LAB — COMPLETE METABOLIC PANEL WITH GFR
AG Ratio: 1.8 (calc) (ref 1.0–2.5)
ALT: 31 U/L — ABNORMAL HIGH (ref 6–29)
AST: 16 U/L (ref 10–35)
Albumin: 4.2 g/dL (ref 3.6–5.1)
Alkaline phosphatase (APISO): 70 U/L (ref 37–153)
BUN: 13 mg/dL (ref 7–25)
CO2: 30 mmol/L (ref 20–32)
Calcium: 9.6 mg/dL (ref 8.6–10.4)
Chloride: 104 mmol/L (ref 98–110)
Creat: 0.78 mg/dL (ref 0.50–0.99)
GFR, Est African American: 90 mL/min/{1.73_m2} (ref >=60)
GFR, Est Non African American: 78 mL/min/{1.73_m2} (ref >=60)
Globulin: 2.4 g/dL (calc) (ref 1.9–3.7)
Glucose, Bld: 194 mg/dL — ABNORMAL HIGH (ref 65–99)
Potassium: 5.7 mmol/L — ABNORMAL HIGH (ref 3.5–5.3)
Sodium: 141 mmol/L (ref 135–146)
Total Bilirubin: 0.4 mg/dL (ref 0.2–1.2)
Total Protein: 6.6 g/dL (ref 6.1–8.1)

## 2019-12-12 LAB — CBC WITH DIFFERENTIAL/PLATELET
Absolute Monocytes: 598 cells/uL (ref 200–950)
Basophils Absolute: 50 cells/uL (ref 0–200)
Basophils Relative: 0.7 %
Eosinophils Absolute: 209 cells/uL (ref 15–500)
Eosinophils Relative: 2.9 %
HCT: 35.2 % (ref 35.0–45.0)
Hemoglobin: 11.6 g/dL — ABNORMAL LOW (ref 11.7–15.5)
Lymphs Abs: 2470 cells/uL (ref 850–3900)
MCH: 30.6 pg (ref 27.0–33.0)
MCHC: 33 g/dL (ref 32.0–36.0)
MCV: 92.9 fL (ref 80.0–100.0)
MPV: 9.5 fL (ref 7.5–12.5)
Monocytes Relative: 8.3 %
Neutro Abs: 3874 cells/uL (ref 1500–7800)
Neutrophils Relative %: 53.8 %
Platelets: 260 10*3/uL (ref 140–400)
RBC: 3.79 10*6/uL — ABNORMAL LOW (ref 3.80–5.10)
RDW: 13 % (ref 11.0–15.0)
Total Lymphocyte: 34.3 %
WBC: 7.2 10*3/uL (ref 3.8–10.8)

## 2019-12-12 LAB — FERRITIN: Ferritin: 139 ng/mL (ref 16–288)

## 2019-12-12 LAB — LIPID PANEL
Cholesterol: 160 mg/dL (ref <200)
HDL: 53 mg/dL (ref >=50)
LDL Cholesterol (Calc): 82 mg/dL (calc)
Non-HDL Cholesterol (Calc): 107 mg/dL (calc) (ref <130)
Total CHOL/HDL Ratio: 3 (calc) (ref <5.0)
Triglycerides: 145 mg/dL (ref <150)

## 2019-12-12 LAB — HEMOGLOBIN A1C
Hgb A1c MFr Bld: 8.3 % of total Hgb — ABNORMAL HIGH (ref <5.7)
Mean Plasma Glucose: 192 (calc)
eAG (mmol/L): 10.6 (calc)

## 2019-12-12 LAB — TEST AUTHORIZATION

## 2019-12-12 LAB — VITAMIN B12: Vitamin B-12: 749 pg/mL (ref 200–1100)

## 2019-12-12 LAB — VITAMIN D 25 HYDROXY (VIT D DEFICIENCY, FRACTURES): Vit D, 25-Hydroxy: 47 ng/mL (ref 30–100)

## 2019-12-16 ENCOUNTER — Other Ambulatory Visit: Payer: Self-pay

## 2019-12-16 ENCOUNTER — Other Ambulatory Visit (INDEPENDENT_AMBULATORY_CARE_PROVIDER_SITE_OTHER): Payer: Medicare PPO

## 2019-12-16 DIAGNOSIS — E875 Hyperkalemia: Secondary | ICD-10-CM

## 2019-12-17 LAB — BASIC METABOLIC PANEL
BUN: 17 mg/dL (ref 7–25)
CO2: 31 mmol/L (ref 20–32)
Calcium: 9.6 mg/dL (ref 8.6–10.4)
Chloride: 101 mmol/L (ref 98–110)
Creat: 0.76 mg/dL (ref 0.50–0.99)
Glucose, Bld: 187 mg/dL — ABNORMAL HIGH (ref 65–99)
Potassium: 4.5 mmol/L (ref 3.5–5.3)
Sodium: 138 mmol/L (ref 135–146)

## 2019-12-24 DIAGNOSIS — R053 Chronic cough: Secondary | ICD-10-CM | POA: Diagnosis not present

## 2019-12-24 DIAGNOSIS — R490 Dysphonia: Secondary | ICD-10-CM | POA: Diagnosis not present

## 2019-12-24 DIAGNOSIS — J383 Other diseases of vocal cords: Secondary | ICD-10-CM | POA: Diagnosis not present

## 2019-12-24 DIAGNOSIS — R059 Cough, unspecified: Secondary | ICD-10-CM | POA: Diagnosis not present

## 2019-12-25 DIAGNOSIS — G4733 Obstructive sleep apnea (adult) (pediatric): Secondary | ICD-10-CM | POA: Diagnosis not present

## 2019-12-25 DIAGNOSIS — I10 Essential (primary) hypertension: Secondary | ICD-10-CM | POA: Diagnosis not present

## 2019-12-25 DIAGNOSIS — G4710 Hypersomnia, unspecified: Secondary | ICD-10-CM | POA: Diagnosis not present

## 2020-01-03 DIAGNOSIS — G4710 Hypersomnia, unspecified: Secondary | ICD-10-CM | POA: Diagnosis not present

## 2020-01-03 DIAGNOSIS — I10 Essential (primary) hypertension: Secondary | ICD-10-CM | POA: Diagnosis not present

## 2020-01-03 DIAGNOSIS — G4733 Obstructive sleep apnea (adult) (pediatric): Secondary | ICD-10-CM | POA: Diagnosis not present

## 2020-01-05 DIAGNOSIS — Z03818 Encounter for observation for suspected exposure to other biological agents ruled out: Secondary | ICD-10-CM | POA: Diagnosis not present

## 2020-01-06 ENCOUNTER — Other Ambulatory Visit: Payer: Self-pay | Admitting: Pulmonary Disease

## 2020-01-16 NOTE — Progress Notes (Signed)
Virtual Visit via Telephone Note  I connected with Nesa Distel on 01/17/20 at 11:30 AM EST by telephone and verified that I am speaking with the correct person using two identifiers.   I discussed the limitations, risks, security and privacy concerns of performing an evaluation and management service by telephone and the availability of in person appointments. I also discussed with the patient that there may be a patient responsible charge related to this service. The patient expressed understanding and agreed to proceed.  Location patient: home Location provider:  [facility]  [address] [place], [place] [address]  Participants present for the call: patient, provider Patient did not have a visit in the prior 7 days to address this/these issue(s).   History of Present Illness: A1C 8.3 from 11/29; previously 7.7, 7.3. on metformin 1000mg  daily.   Doesn't feel like she has changed much with what she is eating. Saw nutritionist and talked about things that she can do with acid reflux and low carb diet. Felt like she could follow this pretty well. Hasn't been exercising like she used to. Was walking regularly before and stopped this. Weight has been stable. Decreased when metformin was started and when she was walking regularly. Stayed around 145lb.    Observations/Objective: Patient sounds cheerful and well on the phone. I do not appreciate any SOB. Speech and thought processing are grossly intact. Patient reported vitals:  Assessment and Plan:  1. Type II diabetes mellitus with complication (HCC) She really wants to work on diet and exercise rather than adding medication. We will have her recheck A1C in 3 mo time for comparison. If no improvement then we discussed adding SGLT2 or GLP-1 to help with control/weight loss.   - Hemoglobin A1c; Future   Follow Up Instructions:   [id number] [id number] [id number] 11-20 [id number] 21-30 I did not refer this patient for an OV in the next 24  hours for this/these issue(s).  I discussed the assessment and treatment plan with the patient. The patient was provided an opportunity to ask questions and all were answered. The patient agreed with the plan and demonstrated an understanding of the instructions.   The patient was advised to call back or seek an in-person evaluation if the symptoms worsen or if the condition fails to improve as anticipated.  I provided 18 minutes of non-face-to-face time during this encounter.   , MD

## 2020-01-17 ENCOUNTER — Encounter: Payer: Self-pay | Admitting: Family Medicine

## 2020-01-17 ENCOUNTER — Other Ambulatory Visit: Payer: Self-pay

## 2020-01-17 ENCOUNTER — Telehealth (INDEPENDENT_AMBULATORY_CARE_PROVIDER_SITE_OTHER): Payer: Medicare PPO | Admitting: Family Medicine

## 2020-01-17 VITALS — Temp 97.3°F | Wt 145.5 lb

## 2020-01-17 DIAGNOSIS — E118 Type 2 diabetes mellitus with unspecified complications: Secondary | ICD-10-CM | POA: Diagnosis not present

## 2020-01-25 DIAGNOSIS — I10 Essential (primary) hypertension: Secondary | ICD-10-CM | POA: Diagnosis not present

## 2020-01-25 DIAGNOSIS — G4710 Hypersomnia, unspecified: Secondary | ICD-10-CM | POA: Diagnosis not present

## 2020-01-25 DIAGNOSIS — G4733 Obstructive sleep apnea (adult) (pediatric): Secondary | ICD-10-CM | POA: Diagnosis not present

## 2020-02-03 ENCOUNTER — Other Ambulatory Visit: Payer: Self-pay | Admitting: Family Medicine

## 2020-03-02 ENCOUNTER — Other Ambulatory Visit: Payer: Self-pay | Admitting: Family Medicine

## 2020-04-02 DIAGNOSIS — G4733 Obstructive sleep apnea (adult) (pediatric): Secondary | ICD-10-CM | POA: Diagnosis not present

## 2020-04-02 DIAGNOSIS — G4710 Hypersomnia, unspecified: Secondary | ICD-10-CM | POA: Diagnosis not present

## 2020-04-02 DIAGNOSIS — I10 Essential (primary) hypertension: Secondary | ICD-10-CM | POA: Diagnosis not present

## 2020-04-13 ENCOUNTER — Ambulatory Visit: Payer: Medicare PPO

## 2020-04-28 ENCOUNTER — Other Ambulatory Visit: Payer: Self-pay | Admitting: Family Medicine

## 2020-05-04 ENCOUNTER — Ambulatory Visit (INDEPENDENT_AMBULATORY_CARE_PROVIDER_SITE_OTHER): Payer: Medicare PPO

## 2020-05-04 ENCOUNTER — Other Ambulatory Visit: Payer: Self-pay

## 2020-05-04 ENCOUNTER — Telehealth: Payer: Self-pay | Admitting: Family Medicine

## 2020-05-04 VITALS — BP 142/82 | HR 100 | Temp 98.0°F | Ht 61.0 in | Wt 149.0 lb

## 2020-05-04 DIAGNOSIS — E118 Type 2 diabetes mellitus with unspecified complications: Secondary | ICD-10-CM | POA: Diagnosis not present

## 2020-05-04 DIAGNOSIS — Z0000 Encounter for general adult medical examination without abnormal findings: Secondary | ICD-10-CM

## 2020-05-04 DIAGNOSIS — E119 Type 2 diabetes mellitus without complications: Secondary | ICD-10-CM | POA: Diagnosis not present

## 2020-05-04 LAB — HEMOGLOBIN A1C: Hgb A1c MFr Bld: 9.2 % — ABNORMAL HIGH (ref 4.6–6.5)
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
## 2020-05-04 NOTE — Progress Notes (Signed)
*  Patient is aware of [place]*   Chronic Care Management   Note  05/04/2020 Name: Stephanie Grimes MRN: [id number] DOB: 1950-09-17  Stephanie Grimes is a 70 y.o. year old female who is a primary care patient of Koberlein, Steele Berg, MD. I reached out to Oneal Deputy by phone today in response to a referral sent by [name] PCP, Caren Macadam, MD.   [name] was given information about Chronic Care Management services today including:  1. CCM service includes personalized support from designated clinical staff supervised by her physician, including individualized plan of care and coordination with other care providers 2. 24/7 contact phone numbers for assistance for urgent and routine care needs. 3. Service will only be billed when office clinical staff spend 20 minutes or more in a month to coordinate care. 4. Only one practitioner may furnish and bill the service in a calendar month. 5. The patient may stop CCM services at any time (effective at the end of the month) by phone call to the office staff.   Patient agreed to services and verbal consent obtained.   Follow up plan:   Carley Perdue [employer] Scheduler

## 2020-05-04 NOTE — Patient Instructions (Signed)
Stephanie Grimes , Thank you for taking time to come for your Medicare Wellness Visit. I appreciate your ongoing commitment to your health goals. Please review the following plan we discussed and let me know if I can assist you in the future.   Screening recommendations/referrals: Colonoscopy: current due 0912/2024 Mammogram: current due 10/10/2020 Bone Density: current due 08/30/2023 Recommended yearly ophthalmology/optometry visit for glaucoma screening and checkup Recommended yearly dental visit for hygiene and checkup  Vaccinations: Influenza vaccine: current due fall 2022  Pneumococcal vaccine: completed series  Tdap vaccine: current due 06/08/2023 Shingles vaccine: completed series   Advanced directives: pt bringing copies   Conditions/risks identified: Patient unable afford glucometer and testing strips   Next appointment: 06/03/2020  1030 am with Dr. Ethlyn Gallery    Preventive Care 65 Years and Older, Female Preventive care refers to lifestyle choices and visits with your health care provider that can promote health and wellness. What does preventive care include?  A yearly physical exam. This is also called an annual well check.  Dental exams once or twice a year.  Routine eye exams. Ask your health care provider how often you should have your eyes checked.  Personal lifestyle choices, including:  Daily care of your teeth and gums.  Regular physical activity.  Eating a healthy diet.  Avoiding tobacco and drug use.  Limiting alcohol use.  Practicing safe sex.  Taking low-dose aspirin every day.  Taking vitamin and mineral supplements as recommended by your health care provider. What happens during an annual well check? The services and screenings done by your health care provider during your annual well check will depend on your age, overall health, lifestyle risk factors, and family history of disease. Counseling  Your health care provider may ask you questions about  your:  Alcohol use.  Tobacco use.  Drug use.  Emotional well-being.  Home and relationship well-being.  Sexual activity.  Eating habits.  History of falls.  Memory and ability to understand (cognition).  Work and work Statistician.  Reproductive health. Screening  You may have the following tests or measurements:  Height, weight, and BMI.  Blood pressure.  Lipid and cholesterol levels. These may be checked every 5 years, or more frequently if you are over 36 years old.  Skin check.  Lung cancer screening. You may have this screening every year starting at age 50 if you have a 30-pack-year history of smoking and currently smoke or have quit within the past 15 years.  Fecal occult blood test (FOBT) of the stool. You may have this test every year starting at age 33.  Flexible sigmoidoscopy or colonoscopy. You may have a sigmoidoscopy every 5 years or a colonoscopy every 10 years starting at age 82.  Hepatitis C blood test.  Hepatitis B blood test.  Sexually transmitted disease (STD) testing.  Diabetes screening. This is done by checking your blood sugar (glucose) after you have not eaten for a while (fasting). You may have this done every 1-3 years.  Bone density scan. This is done to screen for osteoporosis. You may have this done starting at age 29.  Mammogram. This may be done every 1-2 years. Talk to your health care provider about how often you should have regular mammograms. Talk with your health care provider about your test results, treatment options, and if necessary, the need for more tests. Vaccines  Your health care provider may recommend certain vaccines, such as:  Influenza vaccine. This is recommended every year.  Tetanus, diphtheria, and  acellular pertussis (Tdap, Td) vaccine. You may need a Td booster every 10 years.  Zoster vaccine. You may need this after age 74.  Pneumococcal 13-valent conjugate (PCV13) vaccine. One dose is recommended  after age 26.  Pneumococcal polysaccharide (PPSV23) vaccine. One dose is recommended after age 84. Talk to your health care provider about which screenings and vaccines you need and how often you need them. This information is not intended to replace advice given to you by your health care provider. Make sure you discuss any questions you have with your health care provider. Document Released: 01/23/2015 Document Revised: 09/16/2015 Document Reviewed: 10/28/2014 Elsevier Interactive Patient Education  2017 [place] Prevention in the Home Falls can cause injuries. They can happen to people of all ages. There are many things you can do to make your home safe and to help prevent falls. What can I do on the outside of my home?  Regularly fix the edges of walkways and driveways and fix any cracks.  Remove anything that might make you trip as you walk through a door, such as a raised step or threshold.  Trim any bushes or trees on the path to your home.  Use bright outdoor lighting.  Clear any walking paths of anything that might make someone trip, such as rocks or tools.  Regularly check to see if handrails are loose or broken. Make sure that both sides of any steps have handrails.  Any raised decks and porches should have guardrails on the edges.  Have any leaves, snow, or ice cleared regularly.  Use sand or salt on walking paths during winter.  Clean up any spills in your garage right away. This includes oil or grease spills. What can I do in the bathroom?  Use night lights.  Install grab bars by the toilet and in the tub and shower. Do not use towel bars as grab bars.  Use non-skid mats or decals in the tub or shower.  If you need to sit down in the shower, use a plastic, non-slip stool.  Keep the floor dry. Clean up any water that spills on the floor as soon as it happens.  Remove soap buildup in the tub or shower regularly.  Attach bath mats securely with  double-sided non-slip rug tape.  Do not have throw rugs and other things on the floor that can make you trip. What can I do in the bedroom?  Use night lights.  Make sure that you have a light by your bed that is easy to reach.  Do not use any sheets or blankets that are too big for your bed. They should not hang down onto the floor.  Have a firm chair that has side arms. You can use this for support while you get dressed.  Do not have throw rugs and other things on the floor that can make you trip. What can I do in the kitchen?  Clean up any spills right away.  Avoid walking on wet floors.  Keep items that you use a lot in easy-to-reach places.  If you need to reach something above you, use a strong step stool that has a grab bar.  Keep electrical cords out of the way.  Do not use floor polish or wax that makes floors slippery. If you must use wax, use non-skid floor wax.  Do not have throw rugs and other things on the floor that can make you trip. What can I do with my stairs?  Do not leave any items on the stairs.  Make sure that there are handrails on both sides of the stairs and use them. Fix handrails that are broken or loose. Make sure that handrails are as long as the stairways.  Check any carpeting to make sure that it is firmly attached to the stairs. Fix any carpet that is loose or worn.  Avoid having throw rugs at the top or bottom of the stairs. If you do have throw rugs, attach them to the floor with carpet tape.  Make sure that you have a light switch at the top of the stairs and the bottom of the stairs. If you do not have them, ask someone to add them for you. What else can I do to help prevent falls?  Wear shoes that:  Do not have high heels.  Have rubber bottoms.  Are comfortable and fit you well.  Are closed at the toe. Do not wear sandals.  If you use a stepladder:  Make sure that it is fully opened. Do not climb a closed stepladder.  Make  sure that both sides of the stepladder are locked into place.  Ask someone to hold it for you, if possible.  Clearly mark and make sure that you can see:  Any grab bars or handrails.  First and last steps.  Where the edge of each step is.  Use tools that help you move around (mobility aids) if they are needed. These include:  Canes.  Walkers.  Scooters.  Crutches.  Turn on the lights when you go into a dark area. Replace any light bulbs as soon as they burn out.  Set up your furniture so you have a clear path. Avoid moving your furniture around.  If any of your floors are uneven, fix them.  If there are any pets around you, be aware of where they are.  Review your medicines with your doctor. Some medicines can make you feel dizzy. This can increase your chance of falling. Ask your doctor what other things that you can do to help prevent falls. This information is not intended to replace advice given to you by your health care provider. Make sure you discuss any questions you have with your health care provider. Document Released: 10/23/2008 Document Revised: 06/04/2015 Document Reviewed: 01/31/2014 Elsevier Interactive Patient Education  2017 [employer].

## 2020-05-04 NOTE — Addendum Note (Signed)
Addended by: Janann Colonel on: 05/04/2020 11:42 AM   Modules accepted: Orders

## 2020-05-05 ENCOUNTER — Other Ambulatory Visit: Payer: Self-pay | Admitting: Family Medicine

## 2020-05-05 MED ORDER — EMPAGLIFLOZIN 10 MG PO TABS: 10.0000 mg | ORAL_TABLET | Freq: Every day | ORAL | 0 refills | Status: DC

## 2020-05-05 NOTE — Addendum Note (Signed)
Addended by: Agnes Lawrence on: 05/05/2020 10:08 AM   Modules accepted: Orders

## 2020-05-11 ENCOUNTER — Other Ambulatory Visit: Payer: Self-pay | Admitting: Pulmonary Disease

## 2020-05-25 ENCOUNTER — Telehealth: Payer: Self-pay | Admitting: Pharmacist

## 2020-05-25 NOTE — Chronic Care Management (AMB) (Signed)
Chronic Care Management Pharmacy Assistant   Name: Stephanie Grimes  MRN: [id number] DOB: 1950-10-06  Stephanie Grimes is an 70 y.o. year old female who presents for her initial CCM visit with the clinical pharmacist.  Reason for Encounter: Chart Prep for initial visit on 05/28/20 with Jeni Salles (clinical pharmacist).   Conditions to be addressed/monitored: HLD, DMII and Osteoporosis and Vertigo.  Primary concerns for visit include: Hyperlipidemia, Type 2 Diabetes, Osteoporosis and vertigo.   Recent office visits:  01/17/20 Micheline Rough MD (PCP) - video visit for type 2 diabetes. No medication changes and no follow up. Follow up if symptoms worsen.   12/04/19 Micheline Rough MD (PCP) - presented to clinic for type 2 diabetes and other chronic conditions. Patient started on losartan 25mg  daily. Discontinued denosumab 60mg  and lisinopril 10mg . Follow up in 6 months.   Recent consult visits:  12/24/19 Carol Ada MD ( ENT) - presented to clinic for vocal fold atrophy, cough and dysphonia. No medication changes and follow up in 6 months.   Hospital visits:  None in previous 6 months   Fill History :  ATORVASTATIN 40 MG TABLET 11/26/2019 90   GABAPENTIN 100 MG CAPSULE 09/27/2019 90   LOSARTAN POTASSIUM 25 MG TAB 12/04/2019 90   NORTRIPTYLINE HCL 10 MG CAP 09/13/2019 90   PANTOPRAZOLE SOD DR 40 MG TAB 09/12/2019 90   METFORMIN HCL ER 500 MG TABLET 10/29/2019 90    Medications: Outpatient Encounter Medications as of 05/25/2020  Medication Sig  . atorvastatin (LIPITOR) 40 MG tablet TAKE 1 TABLET BY MOUTH DAILY  . Calcium Carbonate-Vit D-Min (CALCIUM 1200 PO) Take by mouth daily.  . chlorpheniramine (CHLOR-TRIMETON) 4 MG tablet Take 2 tablets (8 mg total) by mouth 2 (two) times daily as needed for allergies.  . Cholecalciferol (VITAMIN D3) 125 MCG (5000 UT) TABS Take 1 tablet by mouth daily.  . empagliflozin (JARDIANCE) 10 MG TABS tablet Take 1 tablet (10 mg total) by mouth  daily before breakfast.  . fluticasone (FLONASE) 50 MCG/ACT nasal spray Place 2 sprays into the nose daily. (Patient taking differently: Place 2 sprays into the nose as needed.)  . gabapentin (NEURONTIN) 100 MG capsule Take 100 mg by mouth. Take 1 tablet morning and afternoon, 2 every night  . ibuprofen (ADVIL,MOTRIN) 200 MG tablet Take 200 mg by mouth as needed for mild pain.   Marland Kitchen losartan (COZAAR) 25 MG tablet Take 1 tablet (25 mg total) by mouth daily.  . metFORMIN (GLUCOPHAGE-XR) 500 MG 24 hr tablet TAKE 2 TABLETS(1000 MG) BY MOUTH DAILY WITH BREAKFAST  . Multiple Vitamin (MULTIVITAMIN) capsule Take 1 capsule by mouth daily.  . nortriptyline (PAMELOR) 10 MG capsule Take 20 mg by mouth at bedtime.   . pantoprazole (PROTONIX) 40 MG tablet TAKE 1 TABLET(40 MG) BY MOUTH TWICE DAILY   No facility-administered encounter medications on file as of 05/25/2020.    Have you seen any other providers since your last visit? No.   Any changes in your medications or health? No.   Any side effects from any medications? No. Not to patients knowledge.   Do you have an symptoms or problems not managed by your medications? No.   Any concerns about your health right now? No not at this time.   Has your provider asked that you check blood pressure, blood sugar, or follow special diet at home? No.  Do you get any type of exercise on a regular basis? Yes. Patient states she is able to do  some yoga here and there. Patient states she isn't able to do the walking she use to. Patient would like to be more active.   Can you think of a goal you would like to reach for your health? Patient would like to get her A1C lower.  Do you have any problems getting your medications? No. Patient states she just hasn't went back to see her GI doctor to get refills on her pantoprazole.   Is there anything that you would like to discuss during the appointment? Patient would like to know if there is any of her medications that  she shouldn't take together at the same time?  Please bring medications and supplements to appointment   Star Rating Drugs:   Atorvastatin 40mg  - last filled on 11/26/19 90DS at [facility]  Metformin xr 500mg  - last filled on 10/29/19 90DS at [place] 25mg  - last filled on 12/04/19 90DS at [facility]   empagliflozin 10mg  - no fill history in chart.   [place] [phone]

## 2020-05-27 NOTE — Progress Notes (Signed)
Chronic Care Management Pharmacy Note  06/03/2020 Name:  Stephanie Grimes MRN:  [id number] DOB:  1950-10-02  Subjective: Stephanie Grimes is an 70 y.o. year old female who is a primary patient of Koberlein, Steele Berg, MD.  The CCM team was consulted for assistance with disease management and care coordination needs.    Engaged with patient by telephone for initial visit in response to provider referral for pharmacy case management and/or care coordination services.   Consent to Services:  The patient was given the following information about Chronic Care Management services today, agreed to services, and gave verbal consent: 1. CCM service includes personalized support from designated clinical staff supervised by the primary care provider, including individualized plan of care and coordination with other care providers 2. 24/7 contact phone numbers for assistance for urgent and routine care needs. 3. Service will only be billed when office clinical staff spend 20 minutes or more in a month to coordinate care. 4. Only one practitioner may furnish and bill the service in a calendar month. 5.The patient may stop CCM services at any time (effective at the end of the month) by phone call to the office staff. 6. The patient will be responsible for cost sharing (co-pay) of up to 20% of the service fee (after annual deductible is met). Patient agreed to services and consent obtained.  Patient Care Team: Caren Macadam, MD as PCP - General (Family Medicine) Nunzio Cobbs, MD as Consulting Physician (Obstetrics and Gynecology) Hilarie Fredrickson, Lajuan Lines, MD as Consulting Physician (Gastroenterology) Viona Gilmore, [facility] as Pharmacist (Pharmacist)  Recent office visits: 01/17/20 Micheline Rough MD (PCP) - video visit for type 2 diabetes. No medication changes and no follow up. Follow up if symptoms worsen.   12/04/19 Micheline Rough MD (PCP) - presented to clinic for type 2 diabetes and other chronic  conditions. Patient started on losartan 27m daily. Discontinued denosumab 620mand lisinopril 1066mFollow up in 6 months.   Recent consult visits: 12/24/19 SteCarol Ada ( ENT) - presented to clinic for vocal fold atrophy, cough and dysphonia. No medication changes and follow up in 6 months.   Hospital visits: None in previous 6 months  Objective:  Lab Results  Component Value Date   CREATININE 0.76 12/16/2019   BUN 17 12/16/2019   GFR 72.20 06/03/2019   GFRNONAA 78 12/09/2019   GFRAA 90 12/09/2019   NA 138 12/16/2019   K 4.5 12/16/2019   CALCIUM 9.6 12/16/2019   CO2 31 12/16/2019   GLUCOSE 187 (H) 12/16/2019    Lab Results  Component Value Date/Time   HGBA1C 9.2 (H) 05/04/2020 11:27 AM   HGBA1C 8.3 (H) 12/09/2019 10:21 AM   GFR 72.20 06/03/2019 11:04 AM   GFR 90.66 07/18/2018 01:11 PM   MICROALBUR <0.7 06/03/2019 11:04 AM   MICROALBUR 2.0 (H) 07/18/2018 01:11 PM    Last diabetic Eye exam:  Lab Results  Component Value Date/Time   HMDIABEYEEXA No Retinopathy 03/01/2018 12:00 AM    Last diabetic Foot exam: No results found for: HMDIABFOOTEX   Lab Results  Component Value Date   CHOL 160 12/09/2019   HDL 53 12/09/2019   LDLCALC 82 12/09/2019   LDLDIRECT 146.0 05/06/2016   TRIG 145 12/09/2019   CHOLHDL 3.0 12/09/2019    Hepatic Function Latest Ref Rng & Units 12/09/2019 06/03/2019 07/18/2018  Total Protein 6.1 - 8.1 g/dL 6.6 6.5 7.2  Albumin 3.5 - 5.2 g/dL - 4.3 4.7  AST 10 -  35 U/L 16 18 12   ALT 6 - 29 U/L 31(H) 31 20  Alk Phosphatase 39 - 117 U/L - 69 72  Total Bilirubin 0.2 - 1.2 mg/dL 0.4 0.6 0.5  Bilirubin, Direct 0.0 - 0.3 mg/dL - - -    Lab Results  Component Value Date/Time   TSH 1.41 05/24/2017 08:54 AM   TSH 1.30 05/06/2016 01:35 PM    CBC Latest Ref Rng & Units 12/09/2019 06/03/2019 10/18/2018  WBC 3.8 - 10.8 Thousand/uL 7.2 7.0 7.8  Hemoglobin 11.7 - 15.5 g/dL 11.6(L) 11.8(L) 12.8  Hematocrit 35.0 - 45.0 % 35.2 35.4(L) 38.3  Platelets  140 - 400 Thousand/uL 260 254.0 270.0    Lab Results  Component Value Date/Time   VD25OH 47 12/09/2019 10:21 AM   VD25OH 56.08 06/03/2019 11:04 AM   VD25OH 28.62 (L) 07/18/2018 01:11 PM    Clinical ASCVD: No  The 10-year ASCVD risk score Mikey Bussing DC Jr., et al., 2013) is: 23.5%   Values used to calculate the score:     Age: 50 years     Sex: Female     Is Non-Hispanic African American: No     Diabetic: Yes     Tobacco smoker: No     Systolic Blood Pressure: 585 mmHg     Is BP treated: Yes     HDL Cholesterol: 53 mg/dL     Total Cholesterol: 160 mg/dL    Depression screen [facility] 2/9 05/04/2020 12/04/2019 11/22/2017  Decreased Interest 0 0 0  Down, Depressed, Hopeless 0 0 0  PHQ - 2 Score 0 0 0       Social History   Tobacco Use  Smoking Status Never Smoker  Smokeless Tobacco Never Used   BP Readings from Last 3 Encounters:  05/04/20 (!) 142/82  12/04/19 128/72  10/09/19 138/80   Pulse Readings from Last 3 Encounters:  05/04/20 100  12/04/19 95  10/09/19 100   Wt Readings from Last 3 Encounters:  05/04/20 149 lb (67.6 kg)  01/17/20 145 lb 8 oz (66 kg)  12/04/19 145 lb 9.6 oz (66 kg)   BMI Readings from Last 3 Encounters:  05/04/20 28.15 kg/m  01/17/20 27.05 kg/m  12/04/19 27.07 kg/m    Assessment/Interventions: Review of patient past medical history, allergies, medications, health status, including review of consultants reports, laboratory and other test data, was performed as part of comprehensive evaluation and provision of chronic care management services.   SDOH:  (Social Determinants of Health) assessments and interventions performed: Yes SDOH Interventions   Flowsheet Row Most Recent Value  SDOH Interventions   Financial Strain Interventions Intervention Not Indicated  Transportation Interventions Intervention Not Indicated     SDOH Screenings   Alcohol Screen: Low Risk   . Last Alcohol Screening Score (AUDIT): 0  Depression (PHQ2-9): Low Risk    . PHQ-2 Score: 0  Financial Resource Strain: Low Risk   . Difficulty of Paying Living Expenses: Not hard at all  Food Insecurity: No Food Insecurity  . Worried About Charity fundraiser in the Last Year: Never true  . Ran Out of Food in the Last Year: Never true  Housing: Low Risk   . Last Housing Risk Score: 0  Physical Activity: Insufficiently Active  . Days of Exercise per Week: 3 days  . Minutes of Exercise per Session: 30 min  Social Connections: Socially Integrated  . Frequency of Communication with Friends and Family: More than three times a week  . Frequency of  Social Gatherings with Friends and Family: More than three times a week  . Attends Religious Services: 1 to 4 times per year  . Active Member of Clubs or Organizations: Yes  . Attends Archivist Meetings: More than 4 times per year  . Marital Status: Married  Stress: No Stress Concern Present  . Feeling of Stress : Not at all  Tobacco Use: Low Risk   . Smoking Tobacco Use: Never Smoker  . Smokeless Tobacco Use: Never Used  Transportation Needs: No Transportation Needs  . Lack of Transportation (Medical): No  . Lack of Transportation (Non-Medical): No   Patient states she is able to do some yoga here and there. Patient states she isn't able to do the walking she use to. Patient would like to be more active. Patient is trying to walk at least once a day but is not walking as much.   Patient would like to know if there is any of her medications that she shouldn't take together at the same time.  Patient is retired and she usually gets up at about 8am. Patient has been eating oatmeal with coffee for breakfast and then does household chores that never seem to go away as she lives in a 70 year old house. She watches her grandchildren after school several days a week.   Patient reports she is "healthy eater" and eats fresh vegetables in the summer. She also tries to limit meat a couple days a week and only eats  red meat occasionally. She has mostly chicken as fish is too expensive lately. She doesn't eat out much anymore and her diet has been a lot better recently.  Patient reads a lot and enjoys that. Patient also goes to the farmer's market especially in the summer.  Patient thought she slept well until she went for pulmonary testing and found out she has sleep apnea. Patient's masks blows air on her face and she is a side sleeper. Patient has been using it for a year and a half and has to have the full mask because she is an open mouth sleeper. Patient has never been a great sleeper and falls asleep quickly but has trouble getting back to sleep when she wakes up.   Patient denies any side effects with current medications.    CCM Care Plan  Allergies  Allergen Reactions  . Sulfa Antibiotics Swelling    Extremities swell up, no airway problem  . Omeprazole     diarrhea    Medications Reviewed Today    Reviewed by Viona Gilmore, [facility] (Pharmacist) on 05/28/20 at 1008  Med List Status: <None>  Medication Order Taking? Sig Documenting Provider Last Dose Status Informant  atorvastatin (LIPITOR) 40 MG tablet [id number] Yes TAKE 1 TABLET BY MOUTH DAILY Koberlein, Junell C, MD Taking Active   Calcium Carbonate-Vit D-Min (CALCIUM 1200 PO) [id number] Yes Take by mouth daily. [provider] Taking Active   chlorpheniramine (CHLOR-TRIMETON) 4 MG tablet [id number] Yes Take 2 tablets (8 mg total) by mouth 2 (two) times daily as needed for allergies. Marshell Garfinkel, MD Taking Active   empagliflozin (JARDIANCE) 10 MG TABS tablet [id number] Yes Take 1 tablet (10 mg total) by mouth daily before breakfast. Caren Macadam, MD Taking Active   fluticasone (FLONASE) 50 MCG/ACT nasal spray [id number] Yes Place 2 sprays into the nose daily.  Patient taking differently: Place 2 sprays into the nose as needed.   Marletta Lor, MD Taking Active   gabapentin (NEURONTIN)  100 MG capsule [id number] Yes  Take 100 mg by mouth. Take 1 tablet morning and afternoon, 2 every night [provider] Taking Active   ibuprofen (ADVIL,MOTRIN) 200 MG tablet [id number]  Take 200 mg by mouth as needed for mild pain.  [provider]  Active Self  losartan (COZAAR) 25 MG tablet [id number] Yes Take 1 tablet (25 mg total) by mouth daily. Caren Macadam, MD Taking Active   metFORMIN (GLUCOPHAGE-XR) 500 MG 24 hr tablet [id number] Yes TAKE 2 TABLETS(1000 MG) BY MOUTH DAILY WITH BREAKFAST Koberlein, Junell C, MD Taking Active   Multiple Vitamin (MULTIVITAMIN) capsule [id number] Yes Take 1 capsule by mouth daily. [provider] Taking Active Self  nortriptyline (PAMELOR) 10 MG capsule [id number] Yes Take 20 mg by mouth at bedtime.  [provider] Taking Active   pantoprazole (PROTONIX) 40 MG tablet [id number] Yes TAKE 1 TABLET(40 MG) BY MOUTH TWICE DAILY Mannam, Praveen, MD Taking Active           Patient Active Problem List   Diagnosis Date Noted  . Hyperlipidemia associated with type 2 diabetes mellitus ([place]) 12/04/2019  . Post-nasal drainage 11/22/2017  . Vertigo 11/22/2017  . Osteoporosis 08/24/2016  . Type II diabetes mellitus with complication ([place]) 95/18/8416  . History of colonic polyps   . Esophageal reflux 09/24/2010  . Globus sensation 09/24/2010  . Raynauds phenomenon 09/24/2010    Immunization History  Administered Date(s) Administered  . Influenza, High Dose Seasonal PF 11/24/2016, 11/22/2017, 11/07/2018  . Influenza-Unspecified 11/04/2018, 10/18/2019  . PFIZER(Purple Top)SARS-COV-2 Vaccination 02/04/2019, 02/25/2019, 10/18/2019  . Pneumococcal Conjugate-13 11/22/2017  . Tdap 06/07/2013  . Zoster Recombinat (Shingrix) 07/09/2019, 09/27/2019    Conditions to be addressed/monitored:  Hyperlipidemia, Diabetes, GERD, Osteoporosis and Allergic Rhinitis  Care Plan : CCM Pharmacy Care Plan  Updates made by Viona Gilmore, [place] since 06/03/2020 12:00 AM     Problem: Problem: Hyperlipidemia, Diabetes, GERD, Osteoporosis and Allergic Rhinitis     Long-Range Goal: Patient-Specific Goal   Start Date: 05/27/2020  Expected End Date: 05/27/2021  This Visit's Progress: On track  Priority: High  Note:   Current Barriers:  . Unable to independently monitor therapeutic efficacy . Unable to achieve control of diabetes   Pharmacist Clinical Goal(s):  Marland Kitchen Patient will achieve adherence to monitoring guidelines and medication adherence to achieve therapeutic efficacy . achieve control of diabetes as evidenced by A1c  through collaboration with PharmD and provider.   Interventions: . 1:1 collaboration with Caren Macadam, MD regarding development and update of comprehensive plan of care as evidenced by provider attestation and co-signature . Inter-disciplinary care team collaboration (see longitudinal plan of care) . Comprehensive medication review performed; medication list updated in electronic medical record  Hypertension (BP goal <140/90) -Controlled -Current treatment: . Losartan 25 mg 1 tablet daily -Medications previously tried: lisinopril (cough)  -Current home readings: does not check -Current dietary habits: limits red meat and eats lots of vegetables -Current exercise habits: some walking -Denies hypotensive/hypertensive symptoms -Educated on Exercise goal of 150 minutes per week; Importance of home blood pressure monitoring; Proper BP monitoring technique; -Counseled to monitor BP at home weekly, document, and provide log at future appointments -Counseled on diet and exercise extensively Recommended to continue current medication  Hyperlipidemia: (LDL goal < 100) -Controlled -Current treatment: . Atorvastatin 40 mg 1 tablet daily -Medications previously tried: none  -Current dietary patterns: limits take out and red meat -Current exercise habits: some walking -Educated on Cholesterol goals;  Benefits of  statin for ASCVD risk  reduction; Importance of limiting foods high in cholesterol; Exercise goal of 150 minutes per week; -Counseled on diet and exercise extensively Recommended to continue current medication  Diabetes (A1c goal <7%) -Uncontrolled -Current medications: . Jardiance 10 mg 1 tablet daily . Metformin XR 500 mg 2 tablets daily with breakfast -Medications previously tried: none  -Current home glucose readings . fasting glucose: does not check . post prandial glucose: does not check -Denies hypoglycemic/hyperglycemic symptoms -Current meal patterns:  . breakfast: oatmeal  . Lunch/dinner: limits meat and eats vegetables ; limited pasta and french fries and bought an air fryer . snacks: did not discuss . drinks: did not discuss -Current exercise: some walking -Educated on A1c and blood sugar goals; Exercise goal of 150 minutes per week; Benefits of routine self-monitoring of blood sugar; Carbohydrate counting and/or plate method -Counseled to check feet daily and get yearly eye exams -Counseled on diet and exercise extensively Recommended to continue current medication Provided healthy plate handout.  Osteoporosis (Goal prevent fractures) -Controlled -Last DEXA Scan: 08/2018  T-Score femoral neck: -2.1  T-Score total hip: n/a  T-Score lumbar spine: 0.3  T-Score forearm radius: n/a  10-year probability of major osteoporotic fracture: n/a  10-year probability of hip fracture: n/a -Patient is not a candidate for pharmacologic treatment -Current treatment  . Calcium 600 mg 1 tablet twice daily (20 mcg of vitamin D in each) . Multivitamin (25 mcg of vitamin D) daily -Medications previously tried: Prolia (only completed 4 years or less of treatment - BMD improved) -Recommend [phone] units of vitamin D daily. Recommend 1200 mg of calcium daily from dietary and supplemental sources. Recommend weight-bearing and muscle strengthening exercises for building and maintaining bone  density. -Counseled on diet and exercise extensively Recommended to continue current medication  GERD/cough/nerve damage (Goal: minimize symptoms) -Controlled -Current treatment  . Pantoprazole 40 mg 1 tablet twice daily - taking once daily  . Gabapentin 100 mg 2 capsules in the morning and 2 capsules at night . Nortriptyline 10 mg 1 capsule at bedtime  -Medications previously tried: omeprazole -Counseled on non-pharmacologic management of symptoms such as elevating the head of your bed, avoiding eating 2-3 hours before bed, avoiding triggering foods such as acidic, spicy, or fatty foods, eating smaller meals, and wearing clothes that are loose around the waist  Allergic rhinitis (Goal: minimize symptoms) -Controlled -Current treatment  . Chlorpheniramine 4 mg 2 tablets twice daily as needed  . Fluticasone 50 mcg/act 2 sprays as needed -Medications previously tried: none  -Counseled on fall risks with taking older antihistamines such as chlorpheniramine and recommended Zyrtec or Allegra   Health Maintenance -Vaccine gaps: Pneumovax -Current therapy:  . Ibuprofen 200 mg as needed . Multivitamin 1 tablet daily -Educated on Cost vs benefit of each product must be carefully weighed by individual consumer -Patient is satisfied with current therapy and denies issues -Recommended to continue current medication  Patient Goals/Self-Care Activities . Patient will:  - take medications as prescribed check glucose a few times a week, document, and provide at future appointments target a minimum of 150 minutes of moderate intensity exercise weekly  Follow Up Plan: Telephone follow up appointment with care management team member scheduled for: 4 months      Medication Assistance: None required.  Patient affirms current coverage meets needs.  Patient's preferred pharmacy is:  [id number] - [place], [address]  [place] [address] Phone: [phone] Fax: [phone]  [facility] [place], [place], [place] - [address] [place] [address] Phone: [phone] Fax: [phone]  Uses pill box? Yes - pillbox and alarm reminders Pt endorses 100% compliance - sometimes misses the calcium or multivitamin  We discussed: Current pharmacy is preferred with insurance plan and patient is satisfied with pharmacy services Patient decided to: Continue current medication management strategy  Care Plan and Follow Up Patient Decision:  Patient agrees to Care Plan and Follow-up.  Plan: Telephone follow up appointment with care management team member scheduled for:  4 months  Jeni Salles, PharmD [place] Pharmacist [place] [facility] [place] [phone]

## 2020-05-28 ENCOUNTER — Ambulatory Visit (INDEPENDENT_AMBULATORY_CARE_PROVIDER_SITE_OTHER): Payer: Medicare PPO | Admitting: Pharmacist

## 2020-05-28 ENCOUNTER — Telehealth: Payer: Self-pay | Admitting: Pharmacist

## 2020-05-28 DIAGNOSIS — E785 Hyperlipidemia, unspecified: Secondary | ICD-10-CM

## 2020-05-28 DIAGNOSIS — E118 Type 2 diabetes mellitus with unspecified complications: Secondary | ICD-10-CM | POA: Diagnosis not present

## 2020-05-28 DIAGNOSIS — M818 Other osteoporosis without current pathological fracture: Secondary | ICD-10-CM | POA: Diagnosis not present

## 2020-05-28 DIAGNOSIS — E1169 Type 2 diabetes mellitus with other specified complication: Secondary | ICD-10-CM

## 2020-05-28 NOTE — Chronic Care Management (AMB) (Signed)
    Chronic Care Management Pharmacy Assistant   Name: Stephanie Grimes  MRN: [id number] DOB: 1950-03-06  Called and Spoke with [facility] patients pharmacy per Jeni Salles to determine patients last dispensing on her atorvastatin 40mg , lisinopril 10mg , losartan 25mg  and her metformin xr 500mg . Spoke with [facility] and she verified and communicated patients last fills on medications. Atorvastatin 40mg  -03/06/20 90DS, Lisinopril 10mg  - 10/30/19 90DS, Metformin XR 500mg  - 05/04/20 90DS and Losartan 25mg  - 03/06/20 90DS. CPP made aware.    [place]  Clinical Pharmacist Assistant [phone]

## 2020-05-31 ENCOUNTER — Other Ambulatory Visit: Payer: Self-pay | Admitting: Family Medicine

## 2020-05-31 DIAGNOSIS — E118 Type 2 diabetes mellitus with unspecified complications: Secondary | ICD-10-CM

## 2020-06-03 ENCOUNTER — Other Ambulatory Visit: Payer: Self-pay

## 2020-06-03 ENCOUNTER — Ambulatory Visit (INDEPENDENT_AMBULATORY_CARE_PROVIDER_SITE_OTHER): Payer: Medicare PPO | Admitting: Family Medicine

## 2020-06-03 ENCOUNTER — Encounter: Payer: Self-pay | Admitting: Family Medicine

## 2020-06-03 VITALS — BP 132/80 | HR 95 | Temp 97.6°F | Ht 62.0 in | Wt 146.3 lb

## 2020-06-03 DIAGNOSIS — E785 Hyperlipidemia, unspecified: Secondary | ICD-10-CM

## 2020-06-03 DIAGNOSIS — E1169 Type 2 diabetes mellitus with other specified complication: Secondary | ICD-10-CM

## 2020-06-03 DIAGNOSIS — E118 Type 2 diabetes mellitus with unspecified complications: Secondary | ICD-10-CM

## 2020-06-03 DIAGNOSIS — Z0000 Encounter for general adult medical examination without abnormal findings: Secondary | ICD-10-CM | POA: Diagnosis not present

## 2020-06-03 DIAGNOSIS — M818 Other osteoporosis without current pathological fracture: Secondary | ICD-10-CM

## 2020-06-03 DIAGNOSIS — M25551 Pain in right hip: Secondary | ICD-10-CM | POA: Diagnosis not present

## 2020-06-03 DIAGNOSIS — I10 Essential (primary) hypertension: Secondary | ICD-10-CM | POA: Diagnosis not present

## 2020-06-03 DIAGNOSIS — K219 Gastro-esophageal reflux disease without esophagitis: Secondary | ICD-10-CM

## 2020-06-03 MED ORDER — GABAPENTIN 100 MG PO CAPS: ORAL_CAPSULE | ORAL | Status: DC

## 2020-06-03 MED ORDER — BLOOD GLUCOSE MONITOR KIT
PACK | 0 refills | Status: AC
Start: 2020-06-03 — End: ?

## 2020-06-03 MED ORDER — CLARITIN-D 24 HOUR 10-240 MG PO TB24: 1.0000 | ORAL_TABLET | Freq: Every day | ORAL | Status: DC
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
## 2020-06-03 NOTE — Patient Instructions (Addendum)
Hi Stephanie Grimes,  It was great to get to meet you over the telephone! Below is a summary of some of the topics we discussed. I also included the information about making some of those dietary changes we discussed to improve your A1c.  Please reach out to me if you have any questions or need anything before our follow up!  Best, Maddie  Jeni Salles, PharmD, [facility] [phone]  Visit Information  Goals Addressed            This Visit's Progress   . Monitor and Manage My Blood Sugar-Diabetes Type 2       Timeframe:  Short-Term Goal Priority:  High Start Date:                             Expected End Date:                       Follow Up Date 09/27/2020    - check blood sugar at prescribed times - check blood sugar if I feel it is too high or too low - enter blood sugar readings and medication or insulin into daily log - take the blood sugar log to all doctor visits    Why is this important?    Checking your blood sugar at home helps to keep it from getting very high or very low.   Writing the results in a diary or log helps the doctor know how to care for you.   Your blood sugar log should have the time, date and the results.   Also, write down the amount of insulin or other medicine that you take.   Other information, like what you ate, exercise done and how you were feeling, will also be helpful.     Notes:       Patient Care Plan: CCM Pharmacy Care Plan    Problem Identified: Problem: Hyperlipidemia, Diabetes, GERD, Osteoporosis and Allergic Rhinitis     Long-Range Goal: Patient-Specific Goal   Start Date: 05/27/2020  Expected End Date: 05/27/2021  This Visit's Progress: On track  Priority: High  Note:   Current Barriers:  . Unable to independently monitor therapeutic efficacy . Unable to achieve control of diabetes   Pharmacist Clinical Goal(s):  Marland Kitchen Patient will achieve adherence to monitoring guidelines  and medication adherence to achieve therapeutic efficacy . achieve control of diabetes as evidenced by A1c  through collaboration with PharmD and provider.   Interventions: . 1:1 collaboration with Caren Macadam, MD regarding development and update of comprehensive plan of care as evidenced by provider attestation and co-signature . Inter-disciplinary care team collaboration (see longitudinal plan of care) . Comprehensive medication review performed; medication list updated in electronic medical record  Hypertension (BP goal <140/90) -Controlled -Current treatment: . Losartan 25 mg 1 tablet daily -Medications previously tried: lisinopril (cough)  -Current home readings: does not check -Current dietary habits: limits red meat and eats lots of vegetables -Current exercise habits: some walking -Denies hypotensive/hypertensive symptoms -Educated on Exercise goal of 150 minutes per week; Importance of home blood pressure monitoring; Proper BP monitoring technique; -Counseled to monitor BP at home weekly, document, and provide log at future appointments -Counseled on diet and exercise extensively Recommended to continue current medication  Hyperlipidemia: (LDL goal < 100) -Controlled -Current treatment: . Atorvastatin 40 mg 1 tablet daily -Medications previously tried: none  -Current dietary patterns: limits  take out and red meat -Current exercise habits: some walking -Educated on Cholesterol goals;  Benefits of statin for ASCVD risk reduction; Importance of limiting foods high in cholesterol; Exercise goal of 150 minutes per week; -Counseled on diet and exercise extensively Recommended to continue current medication  Diabetes (A1c goal <7%) -Uncontrolled -Current medications: . Jardiance 10 mg 1 tablet daily . Metformin XR 500 mg 2 tablets daily with breakfast -Medications previously tried: none  -Current home glucose readings . fasting glucose: does not check . post  prandial glucose: does not check -Denies hypoglycemic/hyperglycemic symptoms -Current meal patterns:  . breakfast: oatmeal  . Lunch/dinner: limits meat and eats vegetables ; limited pasta and french fries and bought an air fryer . snacks: did not discuss . drinks: did not discuss -Current exercise: some walking -Educated on A1c and blood sugar goals; Exercise goal of 150 minutes per week; Benefits of routine self-monitoring of blood sugar; Carbohydrate counting and/or plate method -Counseled to check feet daily and get yearly eye exams -Counseled on diet and exercise extensively Recommended to continue current medication Provided healthy plate handout.  Osteoporosis (Goal prevent fractures) -Controlled -Last DEXA Scan: 08/2018  T-Score femoral neck: -2.1  T-Score total hip: n/a  T-Score lumbar spine: 0.3  T-Score forearm radius: n/a  10-year probability of major osteoporotic fracture: n/a  10-year probability of hip fracture: n/a -Patient is not a candidate for pharmacologic treatment -Current treatment  . Calcium 600 mg 1 tablet twice daily (20 mcg of vitamin D in each) . Multivitamin (25 mcg of vitamin D) daily -Medications previously tried: Prolia (only completed 4 years or less of treatment - BMD improved) -Recommend [phone] units of vitamin D daily. Recommend 1200 mg of calcium daily from dietary and supplemental sources. Recommend weight-bearing and muscle strengthening exercises for building and maintaining bone density. -Counseled on diet and exercise extensively Recommended to continue current medication  GERD/cough/nerve damage (Goal: minimize symptoms) -Controlled -Current treatment  . Pantoprazole 40 mg 1 tablet twice daily - taking once daily  . Gabapentin 100 mg 2 capsules in the morning and 2 capsules at night . Nortriptyline 10 mg 1 capsule at bedtime  -Medications previously tried: omeprazole -Counseled on non-pharmacologic management of symptoms such as  elevating the head of your bed, avoiding eating 2-3 hours before bed, avoiding triggering foods such as acidic, spicy, or fatty foods, eating smaller meals, and wearing clothes that are loose around the waist  Allergic rhinitis (Goal: minimize symptoms) -Controlled -Current treatment  . Chlorpheniramine 4 mg 2 tablets twice daily as needed  . Fluticasone 50 mcg/act 2 sprays as needed -Medications previously tried: none  -Counseled on fall risks with taking older antihistamines such as chlorpheniramine and recommended Zyrtec or Allegra   Health Maintenance -Vaccine gaps: Pneumovax -Current therapy:  . Ibuprofen 200 mg as needed . Multivitamin 1 tablet daily -Educated on Cost vs benefit of each product must be carefully weighed by individual consumer -Patient is satisfied with current therapy and denies issues -Recommended to continue current medication  Patient Goals/Self-Care Activities . Patient will:  - take medications as prescribed check glucose a few times a week, document, and provide at future appointments target a minimum of 150 minutes of moderate intensity exercise weekly  Follow Up Plan: Telephone follow up appointment with care management team member scheduled for: 4 months      Ms. Brigandi was given information about Chronic Care Management services today including:  1. CCM service includes personalized support from designated clinical staff supervised  by her physician, including individualized plan of care and coordination with other care providers 2. 24/7 contact phone numbers for assistance for urgent and routine care needs. 3. Standard insurance, coinsurance, copays and deductibles apply for chronic care management only during months in which we provide at least 20 minutes of these services. Most insurances cover these services at 100%, however patients may be responsible for any copay, coinsurance and/or deductible if applicable. This service may help you avoid the  need for more expensive face-to-face services. 4. Only one practitioner may furnish and bill the service in a calendar month. 5. The patient may stop CCM services at any time (effective at the end of the month) by phone call to the office staff.  Patient agreed to services and verbal consent obtained.   The patient verbalized understanding of instructions, educational materials, and care plan provided today and agreed to receive a mailed copy of patient instructions, educational materials, and care plan.  Telephone follow up appointment with pharmacy team member scheduled for:4 months  Viona [place], [facility]

## 2020-06-24 DIAGNOSIS — J383 Other diseases of vocal cords: Secondary | ICD-10-CM | POA: Diagnosis not present

## 2020-06-24 DIAGNOSIS — R059 Cough, unspecified: Secondary | ICD-10-CM | POA: Diagnosis not present

## 2020-06-24 DIAGNOSIS — R490 Dysphonia: Secondary | ICD-10-CM | POA: Diagnosis not present

## 2020-06-30 ENCOUNTER — Other Ambulatory Visit: Payer: Self-pay | Admitting: Family Medicine

## 2020-07-28 ENCOUNTER — Other Ambulatory Visit: Payer: Self-pay | Admitting: Family Medicine

## 2020-08-27 ENCOUNTER — Other Ambulatory Visit (INDEPENDENT_AMBULATORY_CARE_PROVIDER_SITE_OTHER): Payer: Medicare PPO

## 2020-08-27 ENCOUNTER — Other Ambulatory Visit: Payer: Self-pay

## 2020-08-27 DIAGNOSIS — E1169 Type 2 diabetes mellitus with other specified complication: Secondary | ICD-10-CM | POA: Diagnosis not present

## 2020-08-27 DIAGNOSIS — I10 Essential (primary) hypertension: Secondary | ICD-10-CM | POA: Diagnosis not present

## 2020-08-27 DIAGNOSIS — E785 Hyperlipidemia, unspecified: Secondary | ICD-10-CM | POA: Diagnosis not present

## 2020-08-27 DIAGNOSIS — E118 Type 2 diabetes mellitus with unspecified complications: Secondary | ICD-10-CM | POA: Diagnosis not present

## 2020-08-27 LAB — CBC WITH DIFFERENTIAL/PLATELET
Basophils Absolute: 0 10*3/uL (ref 0.0–0.1)
Basophils Relative: 0.5 % (ref 0.0–3.0)
Eosinophils Absolute: 0.2 10*3/uL (ref 0.0–0.7)
Eosinophils Relative: 2.3 % (ref 0.0–5.0)
HCT: 39.4 % (ref 36.0–46.0)
Hemoglobin: 13.2 g/dL (ref 12.0–15.0)
Lymphocytes Relative: 37.1 % (ref 12.0–46.0)
Lymphs Abs: 2.7 10*3/uL (ref 0.7–4.0)
MCHC: 33.4 g/dL (ref 30.0–36.0)
MCV: 92 fl (ref 78.0–100.0)
Monocytes Absolute: 0.6 10*3/uL (ref 0.1–1.0)
Monocytes Relative: 7.9 % (ref 3.0–12.0)
Neutro Abs: 3.8 10*3/uL (ref 1.4–7.7)
Neutrophils Relative %: 52.2 % (ref 43.0–77.0)
Platelets: 273 10*3/uL (ref 150.0–400.0)
RBC: 4.29 Mil/uL (ref 3.87–5.11)
RDW: 14.5 % (ref 11.5–15.5)
WBC: 7.2 10*3/uL (ref 4.0–10.5)

## 2020-08-27 LAB — COMPREHENSIVE METABOLIC PANEL
ALT: 40 U/L — ABNORMAL HIGH (ref 0–35)
AST: 23 U/L (ref 0–37)
Albumin: 4.3 g/dL (ref 3.5–5.2)
Alkaline Phosphatase: 71 U/L (ref 39–117)
BUN: 22 mg/dL (ref 6–23)
CO2: 28 mEq/L (ref 19–32)
Calcium: 10.1 mg/dL (ref 8.4–10.5)
Chloride: 102 mEq/L (ref 96–112)
Creatinine, Ser: 0.75 mg/dL (ref 0.40–1.20)
GFR: 80.87 mL/min (ref >60.00)
Glucose, Bld: 113 mg/dL — ABNORMAL HIGH (ref 70–99)
Potassium: 4.4 mEq/L (ref 3.5–5.1)
Sodium: 139 mEq/L (ref 135–145)
Total Bilirubin: 0.4 mg/dL (ref 0.2–1.2)
Total Protein: 6.9 g/dL (ref 6.0–8.3)

## 2020-08-27 LAB — LIPID PANEL
Cholesterol: 140 mg/dL (ref 0–200)
HDL: 53 mg/dL (ref >39.00)
LDL Cholesterol: 62 mg/dL (ref 0–99)
NonHDL: 86.87
Total CHOL/HDL Ratio: 3
Triglycerides: 124 mg/dL (ref 0.0–149.0)
VLDL: 24.8 mg/dL (ref 0.0–40.0)

## 2020-08-27 LAB — HEMOGLOBIN A1C: Hgb A1c MFr Bld: 7.5 % — ABNORMAL HIGH (ref 4.6–6.5)

## 2020-09-01 ENCOUNTER — Other Ambulatory Visit: Payer: Self-pay | Admitting: Family Medicine

## 2020-09-22 ENCOUNTER — Telehealth: Payer: Self-pay | Admitting: Pharmacist

## 2020-09-22 NOTE — Chronic Care Management (AMB) (Signed)
    Chronic Care Management Pharmacy Assistant   Name: Stephanie Grimes  MRN: [id number] DOB: Jan 01, 1951  09-22-2020 APPOINTMENT REMINDER   Called Stephanie Grimes, No answer, left message of appointment on 09-23-2020 at 9 am via telephone visit with Jeni Salles, Pharm D. Notified to have all medications, supplements, blood pressure and/or blood sugar logs available during appointment and to return call if need to reschedule.  Care Gaps: PNA Vaccine - Overdue COVID Booster #4 Therapist, music) - Overdue Flu Vaccine - Overdue   Star Rating Drug: Atorvastatin (Lipitor) 40 mg - Last filled 11-26-2019 90 DS at [facility] Losartan (Cozaar) 25 mg - Last filled 12-04-2019 90 DS at [facility] Metformin (Glucophage) 500 mg - Last filled 10-29-2019 90 DS at [facility]  Call to [employer] spoke to [facility] she advised:   Atorvastatin (Lipitor) 40 mg - Last filled 09-05-20 90 DS Metformin (Glucophage) 500 mg - Last filled 07-30-2020 30 DS Losartan (Cozaar) 25 mg - Last filled 8-27- 2022 90 DS   Any gaps in medications fill history?  Metformin (Glucophage) 500 mg - Last filled 07-30-2020 30 DS     Medications: Outpatient Encounter Medications as of 09/22/2020  Medication Sig   atorvastatin (LIPITOR) 40 MG tablet TAKE 1 TABLET BY MOUTH DAILY   blood glucose meter kit and supplies KIT Dispense based on patient and insurance preference. Use up to four times daily as directed.   Calcium Carbonate-Vit D-Min (CALCIUM 1200 PO) Take by mouth daily.   fluticasone (FLONASE) 50 MCG/ACT nasal spray Place 2 sprays into the nose daily. (Patient taking differently: Place 2 sprays into the nose as needed.)   gabapentin (NEURONTIN) 100 MG capsule Take 1 tablet morning, 2 every night   ibuprofen (ADVIL,MOTRIN) 200 MG tablet Take 200 mg by mouth as needed for mild pain.    JARDIANCE 10 MG TABS tablet TAKE 1 TABLET(10 MG) BY MOUTH DAILY BEFORE AND BREAKFAST   loratadine-pseudoephedrine (CLARITIN-D 24 HOUR) 10-240 MG 24 hr tablet Take 1  tablet by mouth daily.   losartan (COZAAR) 25 MG tablet TAKE 1 TABLET(25 MG) BY MOUTH DAILY   metFORMIN (GLUCOPHAGE-XR) 500 MG 24 hr tablet TAKE 2 TABLETS(1000 MG) BY MOUTH DAILY WITH BREAKFAST   Multiple Vitamin (MULTIVITAMIN) capsule Take 1 capsule by mouth daily.   nortriptyline (PAMELOR) 10 MG capsule Take 20 mg by mouth at bedtime.    pantoprazole (PROTONIX) 40 MG tablet TAKE 1 TABLET(40 MG) BY MOUTH TWICE DAILY   No facility-administered encounter medications on file as of 09/22/2020.    [facility] Clinical Pharmacist Assistant [phone]

## 2020-09-23 ENCOUNTER — Ambulatory Visit (INDEPENDENT_AMBULATORY_CARE_PROVIDER_SITE_OTHER): Payer: Medicare PPO | Admitting: Pharmacist

## 2020-09-23 DIAGNOSIS — E785 Hyperlipidemia, unspecified: Secondary | ICD-10-CM

## 2020-09-23 DIAGNOSIS — E118 Type 2 diabetes mellitus with unspecified complications: Secondary | ICD-10-CM

## 2020-09-23 DIAGNOSIS — E1169 Type 2 diabetes mellitus with other specified complication: Secondary | ICD-10-CM

## 2020-09-23 NOTE — Progress Notes (Signed)
Chronic Care Management Pharmacy Note  10/09/2020 Name:  Stephanie Grimes MRN:  [id number] DOB:  [birth year]  Summary: A1c not at goal < 7%   Recommendations/Changes made from today's visit: -Recommend increasing metformin to 2000 mg/day at next visit if A1c > 7% -Consider switching to Synjardy XR to ease pill burden -Recommended for pt to purchase a BP cuff   Plan: Follow up in 4 months  Subjective: Stephanie Grimes is an 70 y.o. year old female who is a primary patient of Koberlein, Steele Berg, MD.  The CCM team was consulted for assistance with disease management and care coordination needs.    Engaged with patient by telephone for follow up visit in response to provider referral for pharmacy case management and/or care coordination services.   Consent to Services:  The patient was given information about Chronic Care Management services, agreed to services, and gave verbal consent prior to initiation of services.  Please see initial visit note for detailed documentation.   Patient Care Team: Caren Macadam, MD as PCP - General (Family Medicine) Nunzio Cobbs, MD as Consulting Physician (Obstetrics and Gynecology) Hilarie Fredrickson, Lajuan Lines, MD as Consulting Physician (Gastroenterology) Viona Gilmore, [facility] as Pharmacist (Pharmacist)  Recent office visits: 06/03/20 Micheline Rough MD: Patient presented for annual exam. A1c decreased to 7.5%. Prescribed Claritin D.    Recent consult visits: 06/24/20 Carol Ada MD ( ENT) - presented to clinic for vocal fold atrophy, cough and dysphonia. She tapered to 100 mg gabapentin BID and 10 mg of nortriptyline. Plan to continue tapering.  Hospital visits: None in previous 6 months  Objective:  Lab Results  Component Value Date   CREATININE 0.75 08/27/2020   BUN 22 08/27/2020   GFR 80.87 08/27/2020   GFRNONAA 78 12/09/2019   GFRAA 90 12/09/2019   NA 139 08/27/2020   K 4.4 08/27/2020   CALCIUM 10.1 08/27/2020   CO2 28 08/27/2020    GLUCOSE 113 (H) 08/27/2020    Lab Results  Component Value Date/Time   HGBA1C 7.5 (H) 08/27/2020 09:08 AM   HGBA1C 9.2 (H) 05/04/2020 11:27 AM   GFR 80.87 08/27/2020 09:08 AM   GFR 72.20 06/03/2019 11:04 AM   MICROALBUR <0.7 06/03/2019 11:04 AM   MICROALBUR 2.0 (H) 07/18/2018 01:11 PM    Last diabetic Eye exam:  Lab Results  Component Value Date/Time   HMDIABEYEEXA No Retinopathy 03/01/2018 12:00 AM    Last diabetic Foot exam: No results found for: HMDIABFOOTEX   Lab Results  Component Value Date   CHOL 140 08/27/2020   HDL 53.00 08/27/2020   LDLCALC 62 08/27/2020   LDLDIRECT 146.0 05/06/2016   TRIG 124.0 08/27/2020   CHOLHDL 3 08/27/2020    Hepatic Function Latest Ref Rng & Units 08/27/2020 12/09/2019 06/03/2019  Total Protein 6.0 - 8.3 g/dL 6.9 6.6 6.5  Albumin 3.5 - 5.2 g/dL 4.3 - 4.3  AST 0 - 37 U/L _0 ALT 0 - 35 U/L 40(H) 31(H) 31  Alk Phosphatase 39 - 117 U/L 71 - 69  Total Bilirubin 0.2 - 1.2 mg/dL 0.4 0.4 0.6  Bilirubin, Direct 0.0 - 0.3 mg/dL - - -    Lab Results  Component Value Date/Time   TSH 1.41 05/24/2017 08:54 AM   TSH 1.30 05/06/2016 01:35 PM    CBC Latest Ref Rng & Units 08/27/2020 12/09/2019 06/03/2019  WBC 4.0 - 10.5 K/uL 7.2 7.2 7.0  Hemoglobin 12.0 - 15.0 g/dL 13.2 11.6(L) 11.8(L)  Hematocrit  36.0 - 46.0 % 39.4 35.2 35.4(L)  Platelets 150.0 - 400.0 K/uL 273.0 260 254.0    Lab Results  Component Value Date/Time   VD25OH 47 12/09/2019 10:21 AM   VD25OH 56.08 06/03/2019 11:04 AM   VD25OH 28.62 (L) 07/18/2018 01:11 PM    Clinical ASCVD: No  The 10-year ASCVD risk score (Arnett DK, et al., 2019) is: 26.7%   Values used to calculate the score:     Age: 65 years     Sex: Female     Is Non-Hispanic African American: No     Diabetic: Yes     Tobacco smoker: No     Systolic Blood Pressure: 202 mmHg     Is BP treated: Yes     HDL Cholesterol: 53 mg/dL     Total Cholesterol: 140 mg/dL    Depression screen [facility] 2/9 06/03/2020 05/04/2020  12/04/2019  Decreased Interest 0 0 0  Down, Depressed, Hopeless 0 0 0  PHQ - 2 Score 0 0 0  Altered sleeping 1 - -  Tired, decreased energy 1 - -  Change in appetite 0 - -  Feeling bad or failure about yourself  0 - -  Trouble concentrating 0 - -  Moving slowly or fidgety/restless 0 - -  Suicidal thoughts 0 - -  PHQ-9 Score 2 - -       Social History   Tobacco Use  Smoking Status Never  Smokeless Tobacco Never   BP Readings from Last 3 Encounters:  06/03/20 132/80  05/04/20 (!) 142/82  12/04/19 128/72   Pulse Readings from Last 3 Encounters:  06/03/20 95  05/04/20 100  12/04/19 95   Wt Readings from Last 3 Encounters:  06/03/20 146 lb 4.8 oz (66.4 kg)  05/04/20 149 lb (67.6 kg)  01/17/20 145 lb 8 oz (66 kg)   BMI Readings from Last 3 Encounters:  06/03/20 26.76 kg/m  05/04/20 28.15 kg/m  01/17/20 27.05 kg/m    Assessment/Interventions: Review of patient past medical history, allergies, medications, health status, including review of consultants reports, laboratory and other test data, was performed as part of comprehensive evaluation and provision of chronic care management services.   SDOH:  (Social Determinants of Health) assessments and interventions performed: Yes   SDOH Screenings   Alcohol Screen: Low Risk    Last Alcohol Screening Score (AUDIT): 0  Depression (PHQ2-9): Low Risk    PHQ-2 Score: 2  Financial Resource Strain: Low Risk    Difficulty of Paying Living Expenses: Not hard at all  Food Insecurity: No Food Insecurity   Worried About Charity fundraiser in the Last Year: Never true   Ran Out of Food in the Last Year: Never true  Housing: Low Risk    Last Housing Risk Score: 0  Physical Activity: Insufficiently Active   Days of Exercise per Week: 3 days   Minutes of Exercise per Session: 30 min  Social Connections: Engineer, building services of Communication with Friends and Family: More than three times a week   Frequency of  Social Gatherings with Friends and Family: More than three times a week   Attends Religious Services: 1 to 4 times per year   Active Member of [employer] or Organizations: Yes   Attends Music therapist: More than 4 times per year   Marital Status: Married  Stress: No Stress Concern Present   Feeling of Stress : Not at all  Tobacco Use: Low Risk  Smoking Tobacco Use: Never   Smokeless Tobacco Use: Never  Transportation Needs: No Transportation Needs   Lack of Transportation (Medical): No   Lack of Transportation (Non-Medical): No   CCM Care Plan  Allergies  Allergen Reactions   Sulfa Antibiotics Swelling    Extremities swell up, no airway problem   Omeprazole     diarrhea    Medications Reviewed Today     Reviewed by Agnes Lawrence, CMA (Certified Medical Assistant) on 06/03/20 at 1027  Med List Status: <None>   Medication Order Taking? Sig Documenting Provider Last Dose Status Informant  atorvastatin (LIPITOR) 40 MG tablet [id number] Yes TAKE 1 TABLET BY MOUTH DAILY Koberlein, Junell C, MD Taking Active   Calcium Carbonate-Vit D-Min (CALCIUM 1200 PO) [id number] Yes Take by mouth daily. [provider] Taking Active   fluticasone (FLONASE) 50 MCG/ACT nasal spray [id number] Yes Place 2 sprays into the nose daily.  Patient taking differently: Place 2 sprays into the nose as needed.   Marletta Lor, MD Taking Active   gabapentin (NEURONTIN) 100 MG capsule [id number] Yes Take 100 mg by mouth. Take 1 tablet morning and afternoon, 2 every night [provider] Taking Active   ibuprofen (ADVIL,MOTRIN) 200 MG tablet [id number] Yes Take 200 mg by mouth as needed for mild pain.  [provider] Taking Active Self  JARDIANCE 10 MG TABS tablet [id number] Yes TAKE 1 TABLET(10 MG) BY MOUTH DAILY BEFORE BREAKFAST Koberlein, Junell C, MD Taking Active   losartan (COZAAR) 25 MG tablet [id number] Yes TAKE 1 TABLET(25 MG) BY MOUTH DAILY Koberlein, Junell C, MD  Taking Active   metFORMIN (GLUCOPHAGE-XR) 500 MG 24 hr tablet [id number] Yes TAKE 2 TABLETS(1000 MG) BY MOUTH DAILY WITH BREAKFAST Koberlein, Junell C, MD Taking Active   Multiple Vitamin (MULTIVITAMIN) capsule [id number] Yes Take 1 capsule by mouth daily. [provider] Taking Active Self  nortriptyline (PAMELOR) 10 MG capsule [id number] Yes Take 20 mg by mouth at bedtime.  [provider] Taking Active   pantoprazole (PROTONIX) 40 MG tablet [id number] Yes TAKE 1 TABLET(40 MG) BY MOUTH TWICE DAILY Mannam, Praveen, MD Taking Active             Patient Active Problem List   Diagnosis Date Noted   Hyperlipidemia associated with type 2 diabetes mellitus ([place]) 12/04/2019   Post-nasal drainage 11/22/2017   Vertigo 11/22/2017   Osteoporosis 08/24/2016   Type II diabetes mellitus with complication ([place]) 63/84/5364   History of colonic polyps    Esophageal reflux 09/24/2010   Globus sensation 09/24/2010   Raynauds phenomenon 09/24/2010    Immunization History  Administered Date(s) Administered   Influenza, High Dose Seasonal PF 11/24/2016, 11/22/2017, 11/07/2018   Influenza-Unspecified 11/04/2018, 10/18/2019   PFIZER(Purple Top)SARS-COV-2 Vaccination 02/04/2019, 02/25/2019, 10/18/2019   Pneumococcal Conjugate-13 11/22/2017   Tdap 06/07/2013   Zoster Recombinat (Shingrix) 07/09/2019, 09/27/2019   Patient has had a cough for about 30 years and it has been diagnosed as partly GERD and 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  Patient has recently been trying to get her dad's house sold right now and this has been taking up a lot of her time.  Conditions to be addressed/monitored:  Hypertension, Hyperlipidemia, Diabetes, GERD, Osteoporosis, and Allergic Rhinitis  Conditions addressed this visit: Diabetes, hypertension  Care Plan : CCM Pharmacy Care Plan  Updates made by Viona Gilmore, [place] since 10/09/2020 12:00 AM     Problem: Problem: Hyperlipidemia,  Diabetes, GERD, Osteoporosis and Allergic Rhinitis  Long-Range Goal: Patient-Specific Goal   Start Date: 05/27/2020  Expected End Date: 05/27/2021  Recent Progress: On track  Priority: High  Note:   Current Barriers:  Unable to independently monitor therapeutic efficacy Unable to achieve control of diabetes   Pharmacist Clinical Goal(s):  Patient will achieve adherence to monitoring guidelines and medication adherence to achieve therapeutic efficacy achieve control of diabetes as evidenced by A1c  through collaboration with PharmD and provider.   Interventions: 1:1 collaboration with Caren Macadam, MD regarding development and update of comprehensive plan of care as evidenced by provider attestation and co-signature Inter-disciplinary care team collaboration (see longitudinal plan of care) Comprehensive medication review performed; medication list updated in electronic medical record  Hypertension (BP goal <140/90) -Controlled -Current treatment: Losartan 25 mg 1 tablet daily -Medications previously tried: lisinopril (cough)  -Current home readings: does not check (needs to purchase a BP cuff) -Current dietary habits: limits red meat and eats lots of vegetables -Current exercise habits: some walking -Denies hypotensive/hypertensive symptoms -Educated on Exercise goal of 150 minutes per week; Importance of home blood pressure monitoring; Proper BP monitoring technique; -Counseled to monitor BP at home weekly, document, and provide log at future appointments -Counseled on diet and exercise extensively Recommended to continue current medication Recommended purchasing a BP cuff.  Hyperlipidemia: (LDL goal < 100) -Controlled -Current treatment: Atorvastatin 40 mg 1 tablet daily -Medications previously tried: none  -Current dietary patterns: limits take out and red meat -Current exercise habits: some walking -Educated on Cholesterol goals;  Benefits of statin for  ASCVD risk reduction; Importance of limiting foods high in cholesterol; Exercise goal of 150 minutes per week; -Counseled on diet and exercise extensively Recommended to continue current medication  Diabetes (A1c goal <7%) -Uncontrolled -Current medications: Jardiance 10 mg 1 tablet daily Metformin XR 500 mg 2 tablets daily with breakfast -Medications previously tried: none  -Current home glucose readings fasting glucose: 155, 136, 105, 115 post prandial glucose: 116, 116 -Denies hypoglycemic/hyperglycemic symptoms -Current meal patterns:  breakfast: scrambled eggs, yogurt, and blueberries Lunch/dinner: chicken taco with beans and a salad snacks: did not discuss drinks: did not discuss -Current exercise: some walking -Educated on A1c and blood sugar goals; Exercise goal of 150 minutes per week; Benefits of routine self-monitoring of blood sugar; Carbohydrate counting and/or plate method -Counseled to check feet daily and get yearly eye exams -Counseled on diet and exercise extensively Recommended to continue current medication Educated on goals for blood sugars (morning 80-130, 1-2 hours after eating < 180 and before bedtime 100-150).   Osteoporosis (Goal prevent fractures) -Controlled -Last DEXA Scan: 08/2018  T-Score femoral neck: -2.1  T-Score total hip: n/a  T-Score lumbar spine: 0.3  T-Score forearm radius: n/a  10-year probability of major osteoporotic fracture: n/a  10-year probability of hip fracture: n/a -Patient is not a candidate for pharmacologic treatment -Current treatment  Calcium 600 mg 1 tablet twice daily (20 mcg of vitamin D in each) Multivitamin (25 mcg of vitamin D) daily -Medications previously tried: Prolia (only completed 4 years or less of treatment - BMD improved) -Recommend [phone] units of vitamin D daily. Recommend 1200 mg of calcium daily from dietary and supplemental sources. Recommend weight-bearing and muscle strengthening exercises for  building and maintaining bone density. -Counseled on diet and exercise extensively Recommended to continue current medication  GERD/cough/nerve damage (Goal: minimize symptoms) -Controlled -Current treatment  Pantoprazole 40 mg 1 tablet twice daily - taking once daily  Gabapentin 100 mg 2 capsules in the morning  and 2 capsules at night Nortriptyline 10 mg 1 capsule at bedtime  -Medications previously tried: omeprazole -Counseled on non-pharmacologic management of symptoms such as elevating the head of your bed, avoiding eating 2-3 hours before bed, avoiding triggering foods such as acidic, spicy, or fatty foods, eating smaller meals, and wearing clothes that are loose around the waist  Allergic rhinitis (Goal: minimize symptoms) -Controlled -Current treatment  Chlorpheniramine 4 mg 2 tablets twice daily as needed  Fluticasone 50 mcg/act 2 sprays as needed -Medications previously tried: none  -Counseled on fall risks with taking older antihistamines such as chlorpheniramine and recommended Zyrtec or Allegra   Health Maintenance -Vaccine gaps: Pneumovax -Current therapy:  Ibuprofen 200 mg as needed Multivitamin 1 tablet daily -Educated on Cost vs benefit of each product must be carefully weighed by individual consumer -Patient is satisfied with current therapy and denies issues -Recommended to continue current medication  Patient Goals/Self-Care Activities Patient will:  - take medications as prescribed check glucose a few times a week, document, and provide at future appointments target a minimum of 150 minutes of moderate intensity exercise weekly  Follow Up Plan: Telephone follow up appointment with care management team member scheduled for: 4 months       Medication Assistance: None required.  Patient affirms current coverage meets needs.  Compliance/Adherence/Medication fill history: Care Gaps: Pneumovax or Prevnar 20, COVID booster,influenza   Star-Rating  Drugs: Atorvastatin (Lipitor) 40 mg - Last filled 09-05-20 90 DS Metformin (Glucophage) 500 mg - Last filled 07-30-2020 30 DS Losartan (Cozaar) 25 mg - Last filled 8-27- 2022 90 DS   Patient's preferred pharmacy is:  [id number] [place] [address] Phone: [phone] Fax: [phone]  [facility] [place], [place] - [address] [place] [address] Phone: [phone] Fax: [phone]  Uses pill box? Yes - pillbox and alarm reminders Pt endorses 100% compliance - sometimes misses the calcium or multivitamin  We discussed: Current pharmacy is preferred with insurance plan and patient is satisfied with pharmacy services Patient decided to: Continue current medication management strategy  Care Plan and Follow Up Patient Decision:  Patient agrees to Care Plan and Follow-up.  Plan: Telephone follow up appointment with care management team member scheduled for:  4 months  Jeni Salles, PharmD [place] Pharmacist [place] [facility] [place] [phone]

## 2020-10-01 ENCOUNTER — Other Ambulatory Visit: Payer: Self-pay | Admitting: Family Medicine

## 2020-10-02 DIAGNOSIS — G4710 Hypersomnia, unspecified: Secondary | ICD-10-CM | POA: Diagnosis not present

## 2020-10-02 DIAGNOSIS — I10 Essential (primary) hypertension: Secondary | ICD-10-CM | POA: Diagnosis not present

## 2020-10-02 DIAGNOSIS — G4733 Obstructive sleep apnea (adult) (pediatric): Secondary | ICD-10-CM | POA: Diagnosis not present

## 2020-10-09 DIAGNOSIS — E785 Hyperlipidemia, unspecified: Secondary | ICD-10-CM

## 2020-10-09 DIAGNOSIS — E118 Type 2 diabetes mellitus with unspecified complications: Secondary | ICD-10-CM

## 2020-10-09 DIAGNOSIS — E1169 Type 2 diabetes mellitus with other specified complication: Secondary | ICD-10-CM | POA: Diagnosis not present

## 2020-10-09 NOTE — Patient Instructions (Addendum)
Hi Stephanie Grimes,  It was great catching up with you again! Don't forget to look into getting a blood pressure cuff!  Please reach out to me if you have any questions or need anything before our follow up!  Best, Maddie  Jeni Salles, PharmD, [place] [facility] [place] [phone]   Visit Information   Goals Addressed   None    Patient Care Plan: CCM Pharmacy Care Plan     Problem Identified: Problem: Hyperlipidemia, Diabetes, GERD, Osteoporosis and Allergic Rhinitis      Long-Range Goal: Patient-Specific Goal   Start Date: 05/27/2020  Expected End Date: 05/27/2021  Recent Progress: On track  Priority: High  Note:   Current Barriers:  Unable to independently monitor therapeutic efficacy Unable to achieve control of diabetes   Pharmacist Clinical Goal(s):  Patient will achieve adherence to monitoring guidelines and medication adherence to achieve therapeutic efficacy achieve control of diabetes as evidenced by A1c  through collaboration with PharmD and provider.   Interventions: 1:1 collaboration with Caren Macadam, MD regarding development and update of comprehensive plan of care as evidenced by provider attestation and co-signature Inter-disciplinary care team collaboration (see longitudinal plan of care) Comprehensive medication review performed; medication list updated in electronic medical record  Hypertension (BP goal <140/90) -Controlled -Current treatment: Losartan 25 mg 1 tablet daily -Medications previously tried: lisinopril (cough)  -Current home readings: does not check (needs to purchase a BP cuff) -Current dietary habits: limits red meat and eats lots of vegetables -Current exercise habits: some walking -Denies hypotensive/hypertensive symptoms -Educated on Exercise goal of 150 minutes per week; Importance of home blood pressure monitoring; Proper BP monitoring technique; -Counseled to monitor BP at home weekly,  document, and provide log at future appointments -Counseled on diet and exercise extensively Recommended to continue current medication Recommended purchasing a BP cuff.  Hyperlipidemia: (LDL goal < 100) -Controlled -Current treatment: Atorvastatin 40 mg 1 tablet daily -Medications previously tried: none  -Current dietary patterns: limits take out and red meat -Current exercise habits: some walking -Educated on Cholesterol goals;  Benefits of statin for ASCVD risk reduction; Importance of limiting foods high in cholesterol; Exercise goal of 150 minutes per week; -Counseled on diet and exercise extensively Recommended to continue current medication  Diabetes (A1c goal <7%) -Uncontrolled -Current medications: Jardiance 10 mg 1 tablet daily Metformin XR 500 mg 2 tablets daily with breakfast -Medications previously tried: none  -Current home glucose readings fasting glucose: 155, 136, 105, 115 post prandial glucose: 116, 116 -Denies hypoglycemic/hyperglycemic symptoms -Current meal patterns:  breakfast: scrambled eggs, yogurt, and blueberries Lunch/dinner: chicken taco with beans and a salad snacks: did not discuss drinks: did not discuss -Current exercise: some walking -Educated on A1c and blood sugar goals; Exercise goal of 150 minutes per week; Benefits of routine self-monitoring of blood sugar; Carbohydrate counting and/or plate method -Counseled to check feet daily and get yearly eye exams -Counseled on diet and exercise extensively Recommended to continue current medication Educated on goals for blood sugars (morning 80-130, 1-2 hours after eating < 180 and before bedtime 100-150).   Osteoporosis (Goal prevent fractures) -Controlled -Last DEXA Scan: 08/2018  T-Score femoral neck: -2.1  T-Score total hip: n/a  T-Score lumbar spine: 0.3  T-Score forearm radius: n/a  10-year probability of major osteoporotic fracture: n/a  10-year probability of hip fracture:  n/a -Patient is not a candidate for pharmacologic treatment -Current treatment  Calcium 600 mg 1 tablet twice daily (20 mcg of vitamin D in  each) Multivitamin (25 mcg of vitamin D) daily -Medications previously tried: Prolia (only completed 4 years or less of treatment - BMD improved) -Recommend [phone] units of vitamin D daily. Recommend 1200 mg of calcium daily from dietary and supplemental sources. Recommend weight-bearing and muscle strengthening exercises for building and maintaining bone density. -Counseled on diet and exercise extensively Recommended to continue current medication  GERD/cough/nerve damage (Goal: minimize symptoms) -Controlled -Current treatment  Pantoprazole 40 mg 1 tablet twice daily - taking once daily  Gabapentin 100 mg 2 capsules in the morning and 2 capsules at night Nortriptyline 10 mg 1 capsule at bedtime  -Medications previously tried: omeprazole -Counseled on non-pharmacologic management of symptoms such as elevating the head of your bed, avoiding eating 2-3 hours before bed, avoiding triggering foods such as acidic, spicy, or fatty foods, eating smaller meals, and wearing clothes that are loose around the waist  Allergic rhinitis (Goal: minimize symptoms) -Controlled -Current treatment  Chlorpheniramine 4 mg 2 tablets twice daily as needed  Fluticasone 50 mcg/act 2 sprays as needed -Medications previously tried: none  -Counseled on fall risks with taking older antihistamines such as chlorpheniramine and recommended Zyrtec or Allegra   Health Maintenance -Vaccine gaps: Pneumovax -Current therapy:  Ibuprofen 200 mg as needed Multivitamin 1 tablet daily -Educated on Cost vs benefit of each product must be carefully weighed by individual consumer -Patient is satisfied with current therapy and denies issues -Recommended to continue current medication  Patient Goals/Self-Care Activities Patient will:  - take medications as prescribed check glucose  a few times a week, document, and provide at future appointments target a minimum of 150 minutes of moderate intensity exercise weekly  Follow Up Plan: Telephone follow up appointment with care management team member scheduled for: 4 months       Patient verbalizes understanding of instructions provided today and agrees to view in [place].  Face to Face appointment with pharmacist scheduled for: : 4 months  Viona Gilmore, [facility]

## 2020-10-12 ENCOUNTER — Ambulatory Visit: Payer: Medicare PPO | Admitting: Family Medicine

## 2020-10-14 ENCOUNTER — Other Ambulatory Visit: Payer: Self-pay

## 2020-10-14 ENCOUNTER — Telehealth: Payer: Self-pay | Admitting: Obstetrics and Gynecology

## 2020-10-14 ENCOUNTER — Ambulatory Visit (INDEPENDENT_AMBULATORY_CARE_PROVIDER_SITE_OTHER): Payer: Medicare PPO | Admitting: Obstetrics and Gynecology

## 2020-10-14 ENCOUNTER — Encounter: Payer: Self-pay | Admitting: Obstetrics and Gynecology

## 2020-10-14 ENCOUNTER — Other Ambulatory Visit (HOSPITAL_COMMUNITY)
Admission: RE | Admit: 2020-10-14 | Discharge: 2020-10-14 | Disposition: A | Discharge status: Home / Self Care | Payer: Medicare PPO | Source: Ambulatory Visit | Attending: Obstetrics and Gynecology | Admitting: Obstetrics and Gynecology

## 2020-10-14 VITALS — BP 122/70 | HR 105 | Ht 60.75 in | Wt 130.0 lb

## 2020-10-14 DIAGNOSIS — N812 Incomplete uterovaginal prolapse: Secondary | ICD-10-CM

## 2020-10-14 DIAGNOSIS — Z124 Encounter for screening for malignant neoplasm of cervix: Secondary | ICD-10-CM | POA: Diagnosis not present

## 2020-10-14 DIAGNOSIS — N814 Uterovaginal prolapse, unspecified: Secondary | ICD-10-CM

## 2020-10-14 DIAGNOSIS — N3946 Mixed incontinence: Secondary | ICD-10-CM

## 2020-10-14 DIAGNOSIS — Z01419 Encounter for gynecological examination (general) (routine) without abnormal findings: Secondary | ICD-10-CM

## 2020-10-14 NOTE — Patient Instructions (Signed)

## 2020-10-14 NOTE — Telephone Encounter (Signed)
Please place a referral for patient to see Austin Miles at [facility] for mixed incontinence and uterovaginal prolapse.
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
## 2020-10-14 NOTE — Telephone Encounter (Signed)
Referral order placed. I called patient and spoke with husband. She is not home from her visit here yet. I will call again to let her know referral placed.

## 2020-10-15 LAB — CYTOLOGY - PAP: Diagnosis: NEGATIVE

## 2020-10-20 ENCOUNTER — Other Ambulatory Visit: Payer: Self-pay

## 2020-10-20 ENCOUNTER — Encounter: Payer: Self-pay | Admitting: Obstetrics and Gynecology

## 2020-10-20 ENCOUNTER — Ambulatory Visit: Payer: Medicare PPO | Admitting: Obstetrics and Gynecology

## 2020-10-20 VITALS — BP 140/72 | HR 70 | Ht 60.75 in | Wt 130.0 lb

## 2020-10-20 DIAGNOSIS — N812 Incomplete uterovaginal prolapse: Secondary | ICD-10-CM

## 2020-10-20 NOTE — Patient Instructions (Signed)
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
## 2020-10-23 ENCOUNTER — Encounter: Payer: Self-pay | Admitting: Family Medicine

## 2020-10-23 ENCOUNTER — Other Ambulatory Visit: Payer: Self-pay

## 2020-10-23 ENCOUNTER — Ambulatory Visit: Payer: Medicare PPO | Admitting: Family Medicine

## 2020-10-23 VITALS — BP 120/80 | HR 116 | Temp 98.4°F | Ht 60.75 in | Wt 130.7 lb

## 2020-10-23 DIAGNOSIS — E118 Type 2 diabetes mellitus with unspecified complications: Secondary | ICD-10-CM | POA: Diagnosis not present

## 2020-10-23 DIAGNOSIS — R7401 Elevation of levels of liver transaminase levels: Secondary | ICD-10-CM

## 2020-10-23 DIAGNOSIS — E1169 Type 2 diabetes mellitus with other specified complication: Secondary | ICD-10-CM | POA: Diagnosis not present

## 2020-10-23 DIAGNOSIS — E785 Hyperlipidemia, unspecified: Secondary | ICD-10-CM | POA: Diagnosis not present

## 2020-10-23 MED ORDER — SYNJARDY 5-500 MG PO TABS: 1.0000 | ORAL_TABLET | Freq: Two times a day (BID) | ORAL | 1 refills | Status: DC

## 2020-10-23 MED ORDER — GABAPENTIN 100 MG PO CAPS: 100.0000 mg | ORAL_CAPSULE | Freq: Three times a day (TID) | ORAL | 1 refills | Status: DC | PRN

## 2020-10-23 NOTE — Progress Notes (Signed)
Stephanie Grimes DOB: Nov 17, 1950 Encounter date: 10/23/2020  This is a 70 y.o. female who presents with Chief Complaint  Patient presents with   Follow-up    History of present illness: Had visit with pharmacist since my last visit with her.  Suggested changing treatment to Synjardy to decrease pill burden.  Last visit with me is 06/03/2020  DMII: improved control on last bloodwork 08/2020 with A1C of 7.5 down from 9.2. losartan for renal protection. She is walking regularly a couple of times daily. Hip not bothering her with walking if she keeps in shorter bursts. Walks in morning and then again with grandaughter in afternoon. Sometimes in evening will also walk after supper.  HL: lipitor 64m - tolerates this well. No muscle aches or cramps.   HTN: on losartan 259mand has been good.   Seasonal allergies: about her normal. Her regular cough is so much better. Dr. WrJoya Gaskinss ENT and told her that if I would take charge of gabapentin she could skip regular follow up with him. Taking the gabapentin for her recurrent cough/nerve related after signficant evaluation. Will have maybe one episode daily rather than 5 episodes. Currently stable on gabapentin 10074mID. He told her she was in charge of dose adjusting based on how she felt. She has been able to drop the afternoon dose.   Allergies  Allergen Reactions   Sulfa Antibiotics Swelling    Extremities swell up, no airway problem   Omeprazole     diarrhea   Current Meds  Medication Sig   atorvastatin (LIPITOR) 40 MG tablet TAKE 1 TABLET BY MOUTH DAILY   blood glucose meter kit and supplies KIT Dispense based on patient and insurance preference. Use up to four times daily as directed.   Calcium Carbonate-Vit D-Min (CALCIUM 1200 PO) Take by mouth daily.   Empagliflozin-metFORMIN HCl (SYNJARDY) 5-500 MG TABS Take 1 tablet by mouth 2 (two) times daily.   fluticasone (FLONASE) 50 MCG/ACT nasal spray Place 2 sprays into the nose daily. (Patient  taking differently: Place 2 sprays into the nose as needed.)   ibuprofen (ADVIL,MOTRIN) 200 MG tablet Take 200 mg by mouth as needed for mild pain.    loratadine-pseudoephedrine (CLARITIN-D 24 HOUR) 10-240 MG 24 hr tablet Take 1 tablet by mouth daily.   losartan (COZAAR) 25 MG tablet TAKE 1 TABLET(25 MG) BY MOUTH DAILY   Multiple Vitamin (MULTIVITAMIN) capsule Take 1 capsule by mouth daily.   nortriptyline (PAMELOR) 10 MG capsule Take 10 mg by mouth at bedtime.   [DISCONTINUED] gabapentin (NEURONTIN) 100 MG capsule Take 1 tablet morning, 2 every night (Patient taking differently: Take 100 mg by mouth. Take 2 tablet morning, 1 every night)   [DISCONTINUED] JARDIANCE 10 MG TABS tablet TAKE 1 TABLET(10 MG) BY MOUTH DAILY BEFORE AND BREAKFAST   [DISCONTINUED] metFORMIN (GLUCOPHAGE-XR) 500 MG 24 hr tablet TAKE 2 TABLETS(1000 MG) BY MOUTH DAILY WITH BREAKFAST   [DISCONTINUED] pantoprazole (PROTONIX) 40 MG tablet TAKE 1 TABLET(40 MG) BY MOUTH TWICE DAILY    Review of Systems  Constitutional:  Negative for chills, fatigue and fever.  Respiratory:  Negative for cough, chest tightness, shortness of breath and wheezing.   Cardiovascular:  Negative for chest pain, palpitations and leg swelling.   Objective:  BP 120/80 (BP Location: Left Arm, Patient Position: Sitting, Cuff Size: Normal)   Pulse (!) 116   Temp 98.4 F (36.9 C) (Oral)   Ht 5' 0.75" (1.543 m)   Wt 130 lb 11.2 oz (59.3 kg)  LMP 01/10/1998   SpO2 98%   BMI 24.90 kg/m   Weight: 130 lb 11.2 oz (59.3 kg)   BP Readings from Last 3 Encounters:  10/23/20 120/80  10/20/20 140/72  10/14/20 122/70   Wt Readings from Last 3 Encounters:  10/23/20 130 lb 11.2 oz (59.3 kg)  10/20/20 130 lb (59 kg)  10/14/20 130 lb (59 kg)    Physical Exam Constitutional:      General: She is not in acute distress.    Appearance: She is well-developed.  HENT:     Head: Normocephalic and atraumatic.  Cardiovascular:     Rate and Rhythm: Normal rate  and regular rhythm.     Heart sounds: Normal heart sounds. No murmur heard.    Comments: Varicosities bilat lower legs, feet.  Pulmonary:     Effort: Pulmonary effort is normal.     Breath sounds: Normal breath sounds.  Abdominal:     General: Bowel sounds are normal. There is no distension.     Palpations: Abdomen is soft.     Tenderness: There is no abdominal tenderness. There is no guarding.  Feet:     Comments: Normal bilateral monofilament foot exam.  Mild heel callus.  Skin is intact. Skin:    General: Skin is warm and dry.     Comments: Sensory exam of the foot is normal, tested with the monofilament. Good pulses, no lesions or ulcers, good peripheral pulses.  Psychiatric:        Judgment: Judgment normal.    Assessment/Plan  1. Elevated ALT measurement Mild; we will recheck labs at next visit.   2. Type II diabetes mellitus with complication (HCC) Well controlled. Early for A1C recheck. She is stable on medications. We are going to switch to synjardy for decrease of pill burden.  3. Hyperlipidemia associated with type 2 diabetes mellitus ([place]) Continue with lipitor.    Return in about 4 months (around 02/23/2021) for labwork, then physical exam in may. .  She is getting covid and flu vaccine this weekend.   Micheline Rough, MD

## 2020-10-27 NOTE — Telephone Encounter (Signed)
Spoke with patient and provided her with the number to call and scheduled at [phone]. Patient said she will call.

## 2020-10-28 ENCOUNTER — Encounter: Payer: Self-pay | Admitting: Obstetrics and Gynecology

## 2020-10-28 ENCOUNTER — Ambulatory Visit: Payer: Medicare PPO | Admitting: Obstetrics and Gynecology

## 2020-10-28 ENCOUNTER — Other Ambulatory Visit: Payer: Self-pay

## 2020-10-28 VITALS — BP 138/70 | HR 96 | Ht 60.75 in | Wt 130.0 lb

## 2020-10-28 DIAGNOSIS — Z4689 Encounter for fitting and adjustment of other specified devices: Secondary | ICD-10-CM | POA: Diagnosis not present

## 2020-10-28 DIAGNOSIS — N812 Incomplete uterovaginal prolapse: Secondary | ICD-10-CM | POA: Diagnosis not present
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
## 2020-11-03 ENCOUNTER — Other Ambulatory Visit: Payer: Self-pay | Admitting: Family Medicine

## 2020-11-10 DIAGNOSIS — M85852 Other specified disorders of bone density and structure, left thigh: Secondary | ICD-10-CM | POA: Diagnosis not present

## 2020-11-10 DIAGNOSIS — M85851 Other specified disorders of bone density and structure, right thigh: Secondary | ICD-10-CM | POA: Diagnosis not present

## 2020-11-10 DIAGNOSIS — Z1231 Encounter for screening mammogram for malignant neoplasm of breast: Secondary | ICD-10-CM | POA: Diagnosis not present

## 2020-11-11 ENCOUNTER — Encounter: Payer: Self-pay | Admitting: Obstetrics and Gynecology

## 2020-11-17 ENCOUNTER — Telehealth: Payer: Self-pay

## 2020-11-17 ENCOUNTER — Encounter: Payer: Self-pay | Admitting: Obstetrics and Gynecology

## 2020-11-17 NOTE — Telephone Encounter (Signed)
Caller states she has pulled a muscle in her back. She is having spasms and can't move very well. ---Caller states she has pulled a muscle in her back. She is having spasms and can't move very well. Saturday went to the ballgame, Sunday helped her daughter. Painful lower back, IBU. On lower right side, to hip. Has had bursitis before. PT had exercises. No leg pain. Using ice  11/17/2020 12:35:37 PM See HCP within 4 Hours (or PCP triage) Alvis Lemmings, RN, [place] Understands Yes  Comments User: Manning Charity, RN Date/Time Eilene Ghazi Time): 11/17/2020 12:38:24 PM Please call pt back for appt. Advised to go to UC if no one contacts within 4 hours for appt. UV  User: Manning Charity, RN Date/Time Eilene Ghazi Time): 11/17/2020 12:38:36 PM office closed today  Referrals GO TO FACILITY UNDECIDED  11/17/20 1521: Pt states she did not go to UC. States she followed instructions of ice, Ibuprofen, & movement; she feels much improvement at this time. States she has been alternating cold/heat & it has been really helpful. Declines UC at this time. Pt encourage that if pain returns to go to UC if office is closed or to call office to schedule appt with any provider if open. Pt verb understanding.

## 2020-11-21 NOTE — Telephone Encounter (Signed)
Encounter reviewed and closed.  

## 2020-11-24 NOTE — Telephone Encounter (Signed)
Patient scheduled on 12/07/20

## 2020-11-25 DIAGNOSIS — R921 Mammographic calcification found on diagnostic imaging of breast: Secondary | ICD-10-CM | POA: Diagnosis not present

## 2020-11-26 ENCOUNTER — Other Ambulatory Visit: Payer: Self-pay | Admitting: Family Medicine

## 2020-11-30 ENCOUNTER — Encounter: Payer: Self-pay | Admitting: Obstetrics and Gynecology

## 2020-12-03 ENCOUNTER — Other Ambulatory Visit: Payer: Self-pay | Admitting: Family Medicine

## 2020-12-03 DIAGNOSIS — E118 Type 2 diabetes mellitus with unspecified complications: Secondary | ICD-10-CM

## 2020-12-07 ENCOUNTER — Encounter: Payer: Self-pay | Admitting: Physical Therapy

## 2020-12-07 ENCOUNTER — Other Ambulatory Visit: Payer: Self-pay

## 2020-12-07 ENCOUNTER — Ambulatory Visit: Payer: Medicare PPO | Attending: Obstetrics and Gynecology | Admitting: Physical Therapy

## 2020-12-07 DIAGNOSIS — M6281 Muscle weakness (generalized): Secondary | ICD-10-CM | POA: Diagnosis not present

## 2020-12-07 DIAGNOSIS — R293 Abnormal posture: Secondary | ICD-10-CM | POA: Diagnosis not present

## 2020-12-07 DIAGNOSIS — N3946 Mixed incontinence: Secondary | ICD-10-CM | POA: Insufficient documentation

## 2020-12-07 DIAGNOSIS — R279 Unspecified lack of coordination: Secondary | ICD-10-CM | POA: Diagnosis not present

## 2020-12-07 DIAGNOSIS — N814 Uterovaginal prolapse, unspecified: Secondary | ICD-10-CM | POA: Diagnosis not present

## 2020-12-07 NOTE — Patient Instructions (Addendum)
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
## 2020-12-16 ENCOUNTER — Ambulatory Visit: Payer: Medicare PPO | Attending: Obstetrics and Gynecology | Admitting: Physical Therapy

## 2020-12-16 ENCOUNTER — Other Ambulatory Visit: Payer: Self-pay

## 2020-12-16 DIAGNOSIS — M6281 Muscle weakness (generalized): Secondary | ICD-10-CM | POA: Diagnosis not present

## 2020-12-16 DIAGNOSIS — R293 Abnormal posture: Secondary | ICD-10-CM

## 2020-12-16 DIAGNOSIS — R279 Unspecified lack of coordination: Secondary | ICD-10-CM
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
## 2020-12-16 NOTE — Patient Instructions (Signed)

## 2020-12-21 ENCOUNTER — Ambulatory Visit: Payer: Medicare PPO | Admitting: Physical Therapy

## 2020-12-21 ENCOUNTER — Other Ambulatory Visit: Payer: Self-pay

## 2020-12-21 DIAGNOSIS — M6281 Muscle weakness (generalized): Secondary | ICD-10-CM

## 2020-12-21 DIAGNOSIS — R293 Abnormal posture: Secondary | ICD-10-CM

## 2020-12-21 DIAGNOSIS — R279 Unspecified lack of coordination: Secondary | ICD-10-CM | POA: Diagnosis not present
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
## 2020-12-28 ENCOUNTER — Other Ambulatory Visit: Payer: Self-pay

## 2020-12-28 ENCOUNTER — Ambulatory Visit: Payer: Medicare PPO | Admitting: Physical Therapy

## 2020-12-28 DIAGNOSIS — M6281 Muscle weakness (generalized): Secondary | ICD-10-CM

## 2020-12-28 DIAGNOSIS — R279 Unspecified lack of coordination: Secondary | ICD-10-CM

## 2020-12-28 DIAGNOSIS — R293 Abnormal posture: Secondary | ICD-10-CM
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
## 2021-01-07 ENCOUNTER — Other Ambulatory Visit: Payer: Self-pay

## 2021-01-07 ENCOUNTER — Ambulatory Visit: Payer: Medicare PPO | Admitting: Physical Therapy

## 2021-01-07 DIAGNOSIS — R279 Unspecified lack of coordination: Secondary | ICD-10-CM | POA: Diagnosis not present

## 2021-01-07 DIAGNOSIS — M6281 Muscle weakness (generalized): Secondary | ICD-10-CM | POA: Diagnosis not present

## 2021-01-07 DIAGNOSIS — R293 Abnormal posture: Secondary | ICD-10-CM
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
## 2021-01-13 ENCOUNTER — Other Ambulatory Visit: Payer: Self-pay

## 2021-01-13 ENCOUNTER — Telehealth: Payer: Self-pay | Admitting: Pharmacist

## 2021-01-13 ENCOUNTER — Encounter: Payer: Self-pay | Admitting: Physical Therapy

## 2021-01-13 ENCOUNTER — Ambulatory Visit: Payer: Medicare PPO | Attending: Obstetrics and Gynecology | Admitting: Physical Therapy

## 2021-01-13 DIAGNOSIS — R293 Abnormal posture: Secondary | ICD-10-CM | POA: Diagnosis not present

## 2021-01-13 DIAGNOSIS — M6281 Muscle weakness (generalized): Secondary | ICD-10-CM | POA: Diagnosis not present

## 2021-01-13 DIAGNOSIS — R279 Unspecified lack of coordination: Secondary | ICD-10-CM | POA: Insufficient documentation

## 2021-01-13 NOTE — Chronic Care Management (AMB) (Signed)
° ° °  Chronic Care Management Pharmacy Assistant   Name: Kortlynn Poust  MRN: [id number] DOB: Oct 25, 1950  Reason for Encounter: Reschedule Follow up Per MP    Recent office visits:  10/23/20 Caren Macadam, MD - Patient presented for Elevated ALT measurement and other concerns. Prescribed Empagliflozin-metformin, Changed Gabapentin, Stopped Empagliflozin, Pantoprazole and Metformin  Recent consult visits:  12/28/20 Junie Panning, PT - Patient presented to [facility] for Lack of coordination (PT)  10/28/20 Nunzio Cobbs, MD (OBGYN) - Patient presented for Incomplete uterovaginal prolapse and other concerns. No medication changes.  10/20/20 [place] Raliegh Ip, MD (OBGYN) - Patient presented for Incomplete uterovaginal prolapse and other concerns. No medication changes.  10/28/20 Nunzio Cobbs, MD (OBGYN) - Patient presented for Well woman exam and other concerns. No medication changes.  Hospital visits:  None in previous 6 months  Medications: Outpatient Encounter Medications as of 01/13/2021  Medication Sig   atorvastatin (LIPITOR) 40 MG tablet TAKE 1 TABLET BY MOUTH DAILY   blood glucose meter kit and supplies KIT Dispense based on patient and insurance preference. Use up to four times daily as directed.   Calcium Carbonate-Vit D-Min (CALCIUM 1200 PO) Take by mouth daily.   Empagliflozin-metFORMIN HCl (SYNJARDY) 5-500 MG TABS Take 1 tablet by mouth 2 (two) times daily.   fluticasone (FLONASE) 50 MCG/ACT nasal spray Place 2 sprays into the nose daily. (Patient taking differently: Place 2 sprays into the nose as needed.)   gabapentin (NEURONTIN) 100 MG capsule Take 1 capsule (100 mg total) by mouth 3 (three) times daily as needed. Take 1 tablet morning, 2 every night   ibuprofen (ADVIL,MOTRIN) 200 MG tablet Take 200 mg by mouth as needed for mild pain.    loratadine-pseudoephedrine (CLARITIN-D 24 HOUR) 10-240 MG 24 hr tablet  Take 1 tablet by mouth daily.   losartan (COZAAR) 25 MG tablet TAKE 1 TABLET(25 MG) BY MOUTH DAILY   Multiple Vitamin (MULTIVITAMIN) capsule Take 1 capsule by mouth daily.   nortriptyline (PAMELOR) 10 MG capsule Take 10 mg by mouth at bedtime.   No facility-administered encounter medications on file as of 01/13/2021.  Call to patient to reschedule appointment from 01/19/21 to 01/18/21 per Jeni Salles, patient in agreement.  Care Gaps: PNA Vaccine - Overdue Eye Exam - Overdue Lab Results  Component Value Date   HGBA1C 7.5 (H) 08/27/2020    Star Rating Drugs: Atorvastatin (Lipitor) 40 mg - Last filled 11/30/20 90 DS at [facility] Losartan (Cozaar) 25 mg - Last filled 12/10/21 90 DS at [facility] Empagliflozin- Metformin  (Synjardy) 5-500 mg - Last filled 10/29/20 90 DS at [facility]  Call to [facility] verified the above as accurate   [place] Pharmacist Assistant [phone]
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
## 2021-01-15 ENCOUNTER — Telehealth: Payer: Self-pay | Admitting: Pharmacist

## 2021-01-15 NOTE — Chronic Care Management (AMB) (Signed)
° ° °  Chronic Care Management Pharmacy Assistant   Name: Stephanie Grimes  MRN: [id number] DOB: [birth year]  01/15/21 APPOINTMENT REMINDER  Patient was reminded to have all medications, supplements and any blood glucose and blood pressure readings available for review with Jeni Salles, Pharm. D, for telephone visit on 01/18/21 at 8:30.   Care Gaps: PNA Vaccine - Overdue Eye Exam - Overdue Lab Results  Component Value Date   HGBA1C 7.5 (H) 08/27/2020    Star Rating Drug: Atorvastatin (Lipitor) 40 mg - Last filled 11/30/20 90 DS at [facility] Losartan (Cozaar) 25 mg - Last filled 12/10/21 90 DS at [facility] Empagliflozin- Metformin  (Synjardy) 5-500 mg - Last filled 10/29/20 90 DS at [facility]  Any gaps in medications fill history?  Call to [facility] verified the above as accurate     Medications: Outpatient Encounter Medications as of 01/15/2021  Medication Sig   atorvastatin (LIPITOR) 40 MG tablet TAKE 1 TABLET BY MOUTH DAILY   blood glucose meter kit and supplies KIT Dispense based on patient and insurance preference. Use up to four times daily as directed.   Calcium Carbonate-Vit D-Min (CALCIUM 1200 PO) Take by mouth daily.   Empagliflozin-metFORMIN HCl (SYNJARDY) 5-500 MG TABS Take 1 tablet by mouth 2 (two) times daily.   fluticasone (FLONASE) 50 MCG/ACT nasal spray Place 2 sprays into the nose daily. (Patient taking differently: Place 2 sprays into the nose as needed.)   gabapentin (NEURONTIN) 100 MG capsule Take 1 capsule (100 mg total) by mouth 3 (three) times daily as needed. Take 1 tablet morning, 2 every night   ibuprofen (ADVIL,MOTRIN) 200 MG tablet Take 200 mg by mouth as needed for mild pain.    loratadine-pseudoephedrine (CLARITIN-D 24 HOUR) 10-240 MG 24 hr tablet Take 1 tablet by mouth daily.   losartan (COZAAR) 25 MG tablet TAKE 1 TABLET(25 MG) BY MOUTH DAILY   Multiple Vitamin (MULTIVITAMIN) capsule Take 1 capsule by mouth daily.   nortriptyline (PAMELOR) 10 MG capsule  Take 10 mg by mouth at bedtime.   No facility-administered encounter medications on file as of 01/15/2021.       [place] Clinical Pharmacist Assistant [phone]

## 2021-01-17 NOTE — Progress Notes (Signed)
Chronic Care Management Pharmacy Note  01/18/2021 Name:  Stephanie Grimes MRN:  [id number] DOB:  1950-02-04  Summary: A1c not at goal < 7% LDL at goal < 70   Recommendations/Changes made from today's visit: -Consider increasing metformin to 2000 mg/day at next visit if A1c > 7% -Recommended for pt to purchase a BP cuff -Recommended alternating different times of day for checking blood sugars    Plan: Follow up in 6 months Scheduled CPE for May  Subjective: Stephanie Grimes is an 71 y.o. year old female who is a primary patient of Koberlein, Steele Berg, MD.  The CCM team was consulted for assistance with disease management and care coordination needs.    Engaged with patient by telephone for follow up visit in response to provider referral for pharmacy case management and/or care coordination services.   Consent to Services:  The patient was given information about Chronic Care Management services, agreed to services, and gave verbal consent prior to initiation of services.  Please see initial visit note for detailed documentation.   Patient Care Team: Caren Macadam, MD as PCP - General (Family Medicine) Nunzio Cobbs, MD as Consulting Physician (Obstetrics and Gynecology) Hilarie Fredrickson, Lajuan Lines, MD as Consulting Physician (Gastroenterology) Viona Gilmore, [facility] as Pharmacist (Pharmacist)  Recent office visits: 10/23/20 Micheline Rough MD: Patient presented for DM follow up. Switched to Dent to decrease pill burden.  Recent consult visits: 01/13/21 Stacy Gardner, PT (rehab): Patient presented for PT treatment for lack of coordination.  10/28/20 Hosie Poisson, MD (gynecology): Patient presented for pessary check.  10/20/20 Hosie Poisson, MD (gynecology): Patient presented for pessary fitting.  10/14/20 Hosie Poisson, MD (gynecology): Patient presented for breast and pelvic exam. Follow up for pessary fitting.  06/24/20 Carol Ada MD ( ENT) -  presented to clinic for vocal fold atrophy, cough and dysphonia. She tapered to 100 mg gabapentin BID and 10 mg of nortriptyline. Plan to continue tapering.  Hospital visits: None in previous 6 months  Objective:  Lab Results  Component Value Date   CREATININE 0.75 08/27/2020   BUN 22 08/27/2020   GFR 80.87 08/27/2020   GFRNONAA 78 12/09/2019   GFRAA 90 12/09/2019   NA 139 08/27/2020   K 4.4 08/27/2020   CALCIUM 10.1 08/27/2020   CO2 28 08/27/2020   GLUCOSE 113 (H) 08/27/2020    Lab Results  Component Value Date/Time   HGBA1C 7.5 (H) 08/27/2020 09:08 AM   HGBA1C 9.2 (H) 05/04/2020 11:27 AM   GFR 80.87 08/27/2020 09:08 AM   GFR 72.20 06/03/2019 11:04 AM   MICROALBUR <0.7 06/03/2019 11:04 AM   MICROALBUR 2.0 (H) 07/18/2018 01:11 PM    Last diabetic Eye exam:  Lab Results  Component Value Date/Time   HMDIABEYEEXA No Retinopathy 03/01/2018 12:00 AM    Last diabetic Foot exam: No results found for: HMDIABFOOTEX   Lab Results  Component Value Date   CHOL 140 08/27/2020   HDL 53.00 08/27/2020   LDLCALC 62 08/27/2020   LDLDIRECT 146.0 05/06/2016   TRIG 124.0 08/27/2020   CHOLHDL 3 08/27/2020    Hepatic Function Latest Ref Rng & Units 08/27/2020 12/09/2019 06/03/2019  Total Protein 6.0 - 8.3 g/dL 6.9 6.6 6.5  Albumin 3.5 - 5.2 g/dL 4.3 - 4.3  AST 0 - 37 U/L 23 16 18   ALT 0 - 35 U/L 40(H) 31(H) 31  Alk Phosphatase 39 - 117 U/L 71 - 69  Total Bilirubin 0.2 - 1.2 mg/dL  0.4 0.4 0.6  Bilirubin, Direct 0.0 - 0.3 mg/dL - - -    Lab Results  Component Value Date/Time   TSH 1.41 05/24/2017 08:54 AM   TSH 1.30 05/06/2016 01:35 PM    CBC Latest Ref Rng & Units 08/27/2020 12/09/2019 06/03/2019  WBC 4.0 - 10.5 K/uL 7.2 7.2 7.0  Hemoglobin 12.0 - 15.0 g/dL 13.2 11.6(L) 11.8(L)  Hematocrit 36.0 - 46.0 % 39.4 35.2 35.4(L)  Platelets 150.0 - 400.0 K/uL 273.0 260 254.0    Lab Results  Component Value Date/Time   VD25OH 47 12/09/2019 10:21 AM   VD25OH 56.08 06/03/2019 11:04  AM   VD25OH 28.62 (L) 07/18/2018 01:11 PM    Clinical ASCVD: No  The 10-year ASCVD risk score (Arnett DK, et al., 2019) is: 24%   Values used to calculate the score:     Age: 71 years     Sex: Female     Is Non-Hispanic African American: No     Diabetic: Yes     Tobacco smoker: No     Systolic Blood Pressure: 017 mmHg     Is BP treated: Yes     HDL Cholesterol: 53 mg/dL     Total Cholesterol: 140 mg/dL    Depression screen [facility] 2/9 06/03/2020 05/04/2020 12/04/2019  Decreased Interest 0 0 0  Down, Depressed, Hopeless 0 0 0  PHQ - 2 Score 0 0 0  Altered sleeping 1 - -  Tired, decreased energy 1 - -  Change in appetite 0 - -  Feeling bad or failure about yourself  0 - -  Trouble concentrating 0 - -  Moving slowly or fidgety/restless 0 - -  Suicidal thoughts 0 - -  PHQ-9 Score 2 - -       Social History   Tobacco Use  Smoking Status Never  Smokeless Tobacco Never   BP Readings from Last 3 Encounters:  10/28/20 138/70  10/23/20 120/80  10/20/20 140/72   Pulse Readings from Last 3 Encounters:  10/28/20 96  10/23/20 (!) 116  10/20/20 70   Wt Readings from Last 3 Encounters:  10/28/20 130 lb (59 kg)  10/23/20 130 lb 11.2 oz (59.3 kg)  10/20/20 130 lb (59 kg)   BMI Readings from Last 3 Encounters:  10/28/20 24.77 kg/m  10/23/20 24.90 kg/m  10/20/20 24.77 kg/m    Assessment/Interventions: Review of patient past medical history, allergies, medications, health status, including review of consultants reports, laboratory and other test data, was performed as part of comprehensive evaluation and provision of chronic care management services.   SDOH:  (Social Determinants of Health) assessments and interventions performed: Yes   SDOH Screenings   Alcohol Screen: Low Risk    Last Alcohol Screening Score (AUDIT): 0  Depression (PHQ2-9): Low Risk    PHQ-2 Score: 2  Financial Resource Strain: Low Risk    Difficulty of Paying Living Expenses: Not hard at all  Food  Insecurity: No Food Insecurity   Worried About Charity fundraiser in the Last Year: Never true   Ran Out of Food in the Last Year: Never true  Housing: Low Risk    Last Housing Risk Score: 0  Physical Activity: Insufficiently Active   Days of Exercise per Week: 3 days   Minutes of Exercise per Session: 30 min  Social Connections: Socially Integrated   Frequency of Communication with Friends and Family: More than three times a week   Frequency of Social Gatherings with Friends and Family: More  than three times a week   Attends Religious Services: 1 to 4 times per year   Active Member of Clubs or Organizations: Yes   Attends Music therapist: More than 4 times per year   Marital Status: Married  Stress: No Stress Concern Present   Feeling of Stress : Not at all  Tobacco Use: Low Risk    Smoking Tobacco Use: Never   Smokeless Tobacco Use: Never   Passive Exposure: Not on file  Transportation Needs: No Transportation Needs   Lack of Transportation (Medical): No   Lack of Transportation (Non-Medical): No   CCM Care Plan  Allergies  Allergen Reactions   Sulfa Antibiotics Swelling    Extremities swell up, no airway problem   Omeprazole     diarrhea    Medications Reviewed Today     Reviewed by Junie Panning, PT (Physical Therapist) on 01/13/21 at 6  Med List Status: <None>   Medication Order Taking? Sig Documenting Provider Last Dose Status Informant  atorvastatin (LIPITOR) 40 MG tablet [id number] No TAKE 1 TABLET BY MOUTH DAILY Koberlein, Junell C, MD Taking Active   blood glucose meter kit and supplies KIT [id number] No Dispense based on patient and insurance preference. Use up to four times daily as directed. Caren Macadam, MD Taking Active   Calcium Carbonate-Vit D-Min (CALCIUM 1200 PO) [id number] No Take by mouth daily. [provider] Taking Active   Empagliflozin-metFORMIN HCl (SYNJARDY) 5-500 MG TABS [id number] No Take 1 tablet by mouth 2  (two) times daily. Caren Macadam, MD Taking Active   fluticasone (FLONASE) 50 MCG/ACT nasal spray [id number] No Place 2 sprays into the nose daily.  Patient taking differently: Place 2 sprays into the nose as needed.   Marletta Lor, MD Taking Active   gabapentin (NEURONTIN) 100 MG capsule [id number] No Take 1 capsule (100 mg total) by mouth 3 (three) times daily as needed. Take 1 tablet morning, 2 every night Koberlein, Junell C, MD Taking Active   ibuprofen (ADVIL,MOTRIN) 200 MG tablet [id number] No Take 200 mg by mouth as needed for mild pain.  [provider] Taking Active Self  loratadine-pseudoephedrine (CLARITIN-D 24 HOUR) 10-240 MG 24 hr tablet [id number] No Take 1 tablet by mouth daily. Caren Macadam, MD Taking Active   losartan (COZAAR) 25 MG tablet [id number] No TAKE 1 TABLET(25 MG) BY MOUTH DAILY Koberlein, Junell C, MD Taking Active   Multiple Vitamin (MULTIVITAMIN) capsule [id number] No Take 1 capsule by mouth daily. [provider] Taking Active Self  nortriptyline (PAMELOR) 10 MG capsule [id number] No Take 10 mg by mouth at bedtime. [provider] Taking Active             Patient Active Problem List   Diagnosis Date Noted   Hyperlipidemia associated with type 2 diabetes mellitus ([place]) 12/04/2019   Post-nasal drainage 11/22/2017   Vertigo 11/22/2017   Osteoporosis 08/24/2016   Type II diabetes mellitus with complication ([place]) 90/24/0973   History of colonic polyps    Esophageal reflux 09/24/2010   Globus sensation 09/24/2010   Raynauds phenomenon 09/24/2010    Immunization History  Administered Date(s) Administered   Influenza, High Dose Seasonal PF 11/24/2016, 11/22/2017, 11/07/2018   Influenza-Unspecified 11/04/2018, 10/18/2019   PFIZER(Purple Top)SARS-COV-2 Vaccination 02/04/2019, 02/25/2019, 10/18/2019   Pneumococcal Conjugate-13 11/22/2017   Tdap 06/07/2013   Zoster Recombinat (Shingrix) 07/09/2019, 09/27/2019   Patient  has arthritis in her right hand and is having difficulty putting in her pessary. She  hasn't been wearing it recently because of that. She was having leakage when she was wearing it.  Conditions to be addressed/monitored:  Hypertension, Hyperlipidemia, Diabetes, GERD, Osteoporosis, and Allergic Rhinitis  Conditions addressed this visit: Diabetes, hypertension  Care Plan : CCM Pharmacy Care Plan  Updates made by Viona Gilmore, [place] since 01/18/2021 12:00 AM     Problem: Problem: Hyperlipidemia, Diabetes, GERD, Osteoporosis and Allergic Rhinitis      Long-Range Goal: Patient-Specific Goal   Start Date: 05/27/2020  Expected End Date: 05/27/2021  Recent Progress: On track  Priority: High  Note:   Current Barriers:  Unable to independently monitor therapeutic efficacy Unable to achieve control of diabetes   Pharmacist Clinical Goal(s):  Patient will achieve adherence to monitoring guidelines and medication adherence to achieve therapeutic efficacy achieve control of diabetes as evidenced by A1c  through collaboration with PharmD and provider.   Interventions: 1:1 collaboration with Caren Macadam, MD regarding development and update of comprehensive plan of care as evidenced by provider attestation and co-signature Inter-disciplinary care team collaboration (see longitudinal plan of care) Comprehensive medication review performed; medication list updated in electronic medical record  Hypertension (BP goal <140/90) -Controlled -Current treatment: Losartan 25 mg 1 tablet daily -Medications previously tried: lisinopril (cough)  -Current home readings: does not check (needs to purchase a BP cuff) -Current dietary habits: limits red meat and eats lots of vegetables -Current exercise habits: some walking -Denies hypotensive/hypertensive symptoms -Educated on Exercise goal of 150 minutes per week; Importance of home blood pressure monitoring; Proper BP monitoring  technique; -Counseled to monitor BP at home weekly, document, and provide log at future appointments -Counseled on diet and exercise extensively Recommended to continue current medication Recommended purchasing a BP cuff.  Hyperlipidemia: (LDL goal < 70) -Controlled -Current treatment: Atorvastatin 40 mg 1 tablet daily -Medications previously tried: none  -Current dietary patterns: limits take out and red meat -Current exercise habits: some walking -Educated on Cholesterol goals;  Benefits of statin for ASCVD risk reduction; Importance of limiting foods high in cholesterol; Exercise goal of 150 minutes per week; -Counseled on diet and exercise extensively Recommended to continue current medication  Diabetes (A1c goal <7%) -Uncontrolled -Current medications: Synjardy 5-500 mg 1 tablet twice daily -Medications previously tried: none  -Current home glucose readings fasting glucose: 112 (December average), 107, 130 (had mashed potatoes), 94, 110 and lower usually, 113, 128 post prandial glucose: n/a -Denies hypoglycemic/hyperglycemic symptoms -Current meal patterns:  breakfast: scrambled eggs, yogurt, and blueberries Lunch/dinner: chicken taco with beans and a salad snacks: did not discuss drinks: did not discuss -Current exercise: some walking -Educated on A1c and blood sugar goals; Exercise goal of 150 minutes per week; Benefits of routine self-monitoring of blood sugar; Carbohydrate counting and/or plate method -Counseled to check feet daily and get yearly eye exams -Counseled on diet and exercise extensively Recommended to continue current medication Educated on goals for blood sugars (morning 80-130, 1-2 hours after eating < 180 and before bedtime 100-150).   Osteoporosis (Goal prevent fractures) -Controlled -Last DEXA Scan: 08/2018  T-Score femoral neck: -2.1  T-Score total hip: n/a  T-Score lumbar spine: 0.3  T-Score forearm radius: n/a  10-year probability of  major osteoporotic fracture: n/a  10-year probability of hip fracture: n/a -Patient is not a candidate for pharmacologic treatment -Current treatment  Calcium 600 mg 1 tablet twice daily (20 mcg of vitamin D in each) Multivitamin (25 mcg of vitamin D) daily -Medications previously tried: Prolia (only completed 4 years  or less of treatment - BMD improved) -Recommend [phone] units of vitamin D daily. Recommend 1200 mg of calcium daily from dietary and supplemental sources. Recommend weight-bearing and muscle strengthening exercises for building and maintaining bone density. -Counseled on diet and exercise extensively Recommended to continue current medication  GERD/cough/nerve damage (Goal: minimize symptoms) -Controlled -Current treatment  Pantoprazole 40 mg 1 tablet twice daily - taking once daily  Gabapentin 100 mg 2 capsules in the morning and 2 capsules at night Nortriptyline 10 mg 1 capsule at bedtime  -Medications previously tried: omeprazole -Counseled on non-pharmacologic management of symptoms such as elevating the head of your bed, avoiding eating 2-3 hours before bed, avoiding triggering foods such as acidic, spicy, or fatty foods, eating smaller meals, and wearing clothes that are loose around the waist  Allergic rhinitis (Goal: minimize symptoms) -Controlled -Current treatment  Chlorpheniramine 4 mg 2 tablets twice daily as needed  Fluticasone 50 mcg/act 2 sprays as needed -Medications previously tried: none  -Counseled on fall risks with taking older antihistamines such as chlorpheniramine and recommended Zyrtec or Allegra   Health Maintenance -Vaccine gaps: Pneumovax -Current therapy:  Ibuprofen 200 mg as needed Multivitamin 1 tablet daily -Educated on Cost vs benefit of each product must be carefully weighed by individual consumer -Patient is satisfied with current therapy and denies issues -Recommended to continue current medication  Patient Goals/Self-Care  Activities Patient will:  - take medications as prescribed check glucose a few times a week, document, and provide at future appointments target a minimum of 150 minutes of moderate intensity exercise weekly  Follow Up Plan: Telephone follow up appointment with care management team member scheduled for: 4 months      Medication Assistance: None required.  Patient affirms current coverage meets needs.  Compliance/Adherence/Medication fill history: Care Gaps: Pneumovax or Prevnar 20, eye exam A1c: 7.5% (08/27/20) Last BP: 138/70 (10/28/20)   Star-Rating Drugs: Atorvastatin (Lipitor) 40 mg - Last filled 11/30/20 90 DS at [facility] Losartan (Cozaar) 25 mg - Last filled 12/10/20 90 DS at [facility] Empagliflozin- Metformin  (Synjardy) 5-500 mg - Last filled 10/29/20 90 DS at [facility]  Patient's preferred pharmacy is:  [facility] [place], [place] - [address] [place] [address] Phone: [phone] Fax: [phone]  [facility] [place], [place] - [address] [place] [address] Phone: [phone] Fax: [phone]   Uses pill box? Yes - pillbox and alarm reminders Pt endorses 100% compliance - sometimes misses the calcium or multivitamin  We discussed: Current pharmacy is preferred with insurance plan and patient is satisfied with pharmacy services Patient decided to: Continue current medication management strategy  Care Plan and Follow Up Patient Decision:  Patient agrees to Care Plan and Follow-up.  Plan: Telephone follow up appointment with care management team member scheduled for:  6 months  Jeni Salles, PharmD [place] Pharmacist [place] [facility] [place] [phone]

## 2021-01-18 ENCOUNTER — Ambulatory Visit (INDEPENDENT_AMBULATORY_CARE_PROVIDER_SITE_OTHER): Payer: Medicare PPO | Admitting: Pharmacist

## 2021-01-18 DIAGNOSIS — E1169 Type 2 diabetes mellitus with other specified complication: Secondary | ICD-10-CM

## 2021-01-18 DIAGNOSIS — E118 Type 2 diabetes mellitus with unspecified complications: Secondary | ICD-10-CM

## 2021-01-18 DIAGNOSIS — E785 Hyperlipidemia, unspecified: Secondary | ICD-10-CM

## 2021-01-18 NOTE — Patient Instructions (Signed)
Hi Stephanie Grimes,  It was great to catch up with you again! Don't forget to try to get a blood pressure cuff to be able to check that at home.  Please reach out to me if you have any questions or need anything before our follow up!  Best, Maddie  Jeni Salles, PharmD, [place] [facility] [place]   Visit Information   Goals Addressed   None    Patient Care Plan: CCM Pharmacy Care Plan     Problem Identified: Problem: Hyperlipidemia, Diabetes, GERD, Osteoporosis and Allergic Rhinitis      Long-Range Goal: Patient-Specific Goal   Start Date: 05/27/2020  Expected End Date: 05/27/2021  Recent Progress: On track  Priority: High  Note:   Current Barriers:  Unable to independently monitor therapeutic efficacy Unable to achieve control of diabetes   Pharmacist Clinical Goal(s):  Patient will achieve adherence to monitoring guidelines and medication adherence to achieve therapeutic efficacy achieve control of diabetes as evidenced by A1c  through collaboration with PharmD and provider.   Interventions: 1:1 collaboration with Caren Macadam, MD regarding development and update of comprehensive plan of care as evidenced by provider attestation and co-signature Inter-disciplinary care team collaboration (see longitudinal plan of care) Comprehensive medication review performed; medication list updated in electronic medical record  Hypertension (BP goal <140/90) -Controlled -Current treatment: Losartan 25 mg 1 tablet daily -Medications previously tried: lisinopril (cough)  -Current home readings: does not check (needs to purchase a BP cuff) -Current dietary habits: limits red meat and eats lots of vegetables -Current exercise habits: some walking -Denies hypotensive/hypertensive symptoms -Educated on Exercise goal of 150 minutes per week; Importance of home blood pressure monitoring; Proper BP monitoring technique; -Counseled to  monitor BP at home weekly, document, and provide log at future appointments -Counseled on diet and exercise extensively Recommended to continue current medication Recommended purchasing a BP cuff.  Hyperlipidemia: (LDL goal < 70) -Controlled -Current treatment: Atorvastatin 40 mg 1 tablet daily -Medications previously tried: none  -Current dietary patterns: limits take out and red meat -Current exercise habits: some walking -Educated on Cholesterol goals;  Benefits of statin for ASCVD risk reduction; Importance of limiting foods high in cholesterol; Exercise goal of 150 minutes per week; -Counseled on diet and exercise extensively Recommended to continue current medication  Diabetes (A1c goal <7%) -Uncontrolled -Current medications: Synjardy 5-500 mg 1 tablet twice daily -Medications previously tried: none  -Current home glucose readings fasting glucose: 112 (December average), 107, 130 (had mashed potatoes), 94, 110 and lower usually, 113, 128 post prandial glucose: n/a -Denies hypoglycemic/hyperglycemic symptoms -Current meal patterns:  breakfast: scrambled eggs, yogurt, and blueberries Lunch/dinner: chicken taco with beans and a salad snacks: did not discuss drinks: did not discuss -Current exercise: some walking -Educated on A1c and blood sugar goals; Exercise goal of 150 minutes per week; Benefits of routine self-monitoring of blood sugar; Carbohydrate counting and/or plate method -Counseled to check feet daily and get yearly eye exams -Counseled on diet and exercise extensively Recommended to continue current medication Educated on goals for blood sugars (morning 80-130, 1-2 hours after eating < 180 and before bedtime 100-150).   Osteoporosis (Goal prevent fractures) -Controlled -Last DEXA Scan: 08/2018  T-Score femoral neck: -2.1  T-Score total hip: n/a  T-Score lumbar spine: 0.3  T-Score forearm radius: n/a  10-year probability of major osteoporotic fracture:  n/a  10-year probability of hip fracture: n/a -Patient is not a candidate for pharmacologic treatment -Current treatment  Calcium 600  mg 1 tablet twice daily (20 mcg of vitamin D in each) Multivitamin (25 mcg of vitamin D) daily -Medications previously tried: Prolia (only completed 4 years or less of treatment - BMD improved) -Recommend [phone] units of vitamin D daily. Recommend 1200 mg of calcium daily from dietary and supplemental sources. Recommend weight-bearing and muscle strengthening exercises for building and maintaining bone density. -Counseled on diet and exercise extensively Recommended to continue current medication  GERD/cough/nerve damage (Goal: minimize symptoms) -Controlled -Current treatment  Pantoprazole 40 mg 1 tablet twice daily - taking once daily  Gabapentin 100 mg 2 capsules in the morning and 2 capsules at night Nortriptyline 10 mg 1 capsule at bedtime  -Medications previously tried: omeprazole -Counseled on non-pharmacologic management of symptoms such as elevating the head of your bed, avoiding eating 2-3 hours before bed, avoiding triggering foods such as acidic, spicy, or fatty foods, eating smaller meals, and wearing clothes that are loose around the waist  Allergic rhinitis (Goal: minimize symptoms) -Controlled -Current treatment  Chlorpheniramine 4 mg 2 tablets twice daily as needed  Fluticasone 50 mcg/act 2 sprays as needed -Medications previously tried: none  -Counseled on fall risks with taking older antihistamines such as chlorpheniramine and recommended Zyrtec or Allegra   Health Maintenance -Vaccine gaps: Pneumovax -Current therapy:  Ibuprofen 200 mg as needed Multivitamin 1 tablet daily -Educated on Cost vs benefit of each product must be carefully weighed by individual consumer -Patient is satisfied with current therapy and denies issues -Recommended to continue current medication  Patient Goals/Self-Care Activities Patient will:  -  take medications as prescribed check glucose a few times a week, document, and provide at future appointments target a minimum of 150 minutes of moderate intensity exercise weekly  Follow Up Plan: Telephone follow up appointment with care management team member scheduled for: 4 months       Patient verbalizes understanding of instructions provided today and agrees to view in [place].  Telephone follow up appointment with pharmacy team member scheduled for: 6 months  Viona Gilmore, [facility]

## 2021-01-19 ENCOUNTER — Telehealth: Payer: Medicare PPO

## 2021-01-20 ENCOUNTER — Telehealth: Payer: Medicare PPO

## 2021-01-20 ENCOUNTER — Ambulatory Visit: Payer: Medicare PPO | Admitting: Physical Therapy

## 2021-01-20 ENCOUNTER — Other Ambulatory Visit: Payer: Self-pay

## 2021-01-20 DIAGNOSIS — R279 Unspecified lack of coordination: Secondary | ICD-10-CM | POA: Diagnosis not present

## 2021-01-20 DIAGNOSIS — M6281 Muscle weakness (generalized): Secondary | ICD-10-CM

## 2021-01-20 DIAGNOSIS — R293 Abnormal posture: Secondary | ICD-10-CM | POA: Diagnosis not present
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
## 2021-01-23 ENCOUNTER — Encounter: Payer: Self-pay | Admitting: Obstetrics and Gynecology

## 2021-02-09 DIAGNOSIS — E1169 Type 2 diabetes mellitus with other specified complication: Secondary | ICD-10-CM | POA: Diagnosis not present

## 2021-02-09 DIAGNOSIS — E785 Hyperlipidemia, unspecified: Secondary | ICD-10-CM

## 2021-02-09 DIAGNOSIS — E118 Type 2 diabetes mellitus with unspecified complications: Secondary | ICD-10-CM

## 2021-02-10 ENCOUNTER — Other Ambulatory Visit: Payer: Self-pay

## 2021-02-10 ENCOUNTER — Ambulatory Visit: Payer: Medicare PPO | Attending: Obstetrics and Gynecology | Admitting: Physical Therapy

## 2021-02-10 DIAGNOSIS — R293 Abnormal posture: Secondary | ICD-10-CM | POA: Diagnosis not present

## 2021-02-10 DIAGNOSIS — R278 Other lack of coordination: Secondary | ICD-10-CM | POA: Diagnosis not present

## 2021-02-10 DIAGNOSIS — R279 Unspecified lack of coordination: Secondary | ICD-10-CM | POA: Diagnosis not present

## 2021-02-10 DIAGNOSIS — M6281 Muscle weakness (generalized): Secondary | ICD-10-CM
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
## 2021-02-17 ENCOUNTER — Encounter: Payer: Self-pay | Admitting: Physical Therapy

## 2021-02-17 ENCOUNTER — Other Ambulatory Visit: Payer: Self-pay

## 2021-02-17 ENCOUNTER — Ambulatory Visit: Payer: Medicare PPO | Admitting: Physical Therapy

## 2021-02-17 DIAGNOSIS — R293 Abnormal posture: Secondary | ICD-10-CM | POA: Diagnosis not present

## 2021-02-17 DIAGNOSIS — R278 Other lack of coordination: Secondary | ICD-10-CM | POA: Diagnosis not present

## 2021-02-17 DIAGNOSIS — M6281 Muscle weakness (generalized): Secondary | ICD-10-CM | POA: Diagnosis not present

## 2021-02-17 DIAGNOSIS — R279 Unspecified lack of coordination: Secondary | ICD-10-CM | POA: Diagnosis not present
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
## 2021-02-23 ENCOUNTER — Other Ambulatory Visit (INDEPENDENT_AMBULATORY_CARE_PROVIDER_SITE_OTHER): Payer: Medicare PPO

## 2021-02-23 DIAGNOSIS — E785 Hyperlipidemia, unspecified: Secondary | ICD-10-CM

## 2021-02-23 DIAGNOSIS — E118 Type 2 diabetes mellitus with unspecified complications: Secondary | ICD-10-CM | POA: Diagnosis not present

## 2021-02-23 DIAGNOSIS — E1169 Type 2 diabetes mellitus with other specified complication: Secondary | ICD-10-CM

## 2021-02-23 DIAGNOSIS — R7401 Elevation of levels of liver transaminase levels: Secondary | ICD-10-CM | POA: Diagnosis not present

## 2021-02-23 LAB — CBC WITH DIFFERENTIAL/PLATELET
Basophils Absolute: 0.1 10*3/uL (ref 0.0–0.1)
Basophils Relative: 0.7 % (ref 0.0–3.0)
Eosinophils Absolute: 0.1 10*3/uL (ref 0.0–0.7)
Eosinophils Relative: 1.8 % (ref 0.0–5.0)
HCT: 40.4 % (ref 36.0–46.0)
Hemoglobin: 13.3 g/dL (ref 12.0–15.0)
Lymphocytes Relative: 38.6 % (ref 12.0–46.0)
Lymphs Abs: 2.7 10*3/uL (ref 0.7–4.0)
MCHC: 32.9 g/dL (ref 30.0–36.0)
MCV: 92.2 fl (ref 78.0–100.0)
Monocytes Absolute: 0.6 10*3/uL (ref 0.1–1.0)
Monocytes Relative: 8 % (ref 3.0–12.0)
Neutro Abs: 3.5 10*3/uL (ref 1.4–7.7)
Neutrophils Relative %: 50.9 % (ref 43.0–77.0)
Platelets: 258 10*3/uL (ref 150.0–400.0)
RBC: 4.38 Mil/uL (ref 3.87–5.11)
RDW: 14.7 % (ref 11.5–15.5)
WBC: 6.9 10*3/uL (ref 4.0–10.5)

## 2021-02-23 LAB — MICROALBUMIN / CREATININE URINE RATIO
Creatinine,U: 36.8 mg/dL
Microalb Creat Ratio: 1.9 mg/g (ref 0.0–30.0)
Microalb, Ur: <0.7 mg/dL (ref 0.0–1.9)

## 2021-02-23 LAB — HEMOGLOBIN A1C: Hgb A1c MFr Bld: 6.8 % — ABNORMAL HIGH (ref 4.6–6.5)

## 2021-02-24 ENCOUNTER — Other Ambulatory Visit: Payer: Self-pay

## 2021-02-24 ENCOUNTER — Ambulatory Visit: Payer: Medicare PPO | Admitting: Physical Therapy

## 2021-02-24 ENCOUNTER — Encounter: Payer: Self-pay | Admitting: Physical Therapy

## 2021-02-24 DIAGNOSIS — R293 Abnormal posture: Secondary | ICD-10-CM

## 2021-02-24 DIAGNOSIS — R278 Other lack of coordination: Secondary | ICD-10-CM | POA: Diagnosis not present

## 2021-02-24 DIAGNOSIS — R279 Unspecified lack of coordination: Secondary | ICD-10-CM | POA: Diagnosis not present

## 2021-02-24 DIAGNOSIS — M6281 Muscle weakness (generalized): Secondary | ICD-10-CM

## 2021-02-24 LAB — COMPREHENSIVE METABOLIC PANEL
ALT: 19 U/L (ref 0–35)
AST: 17 U/L (ref 0–37)
Albumin: 4.6 g/dL (ref 3.5–5.2)
Alkaline Phosphatase: 54 U/L (ref 39–117)
BUN: 18 mg/dL (ref 6–23)
CO2: 29 mEq/L (ref 19–32)
Calcium: 10.3 mg/dL (ref 8.4–10.5)
Chloride: 106 mEq/L (ref 96–112)
Creatinine, Ser: 0.65 mg/dL (ref 0.40–1.20)
GFR: 89.12 mL/min (ref >60.00)
Glucose, Bld: 114 mg/dL — ABNORMAL HIGH (ref 70–99)
Potassium: 4.8 mEq/L (ref 3.5–5.1)
Sodium: 141 mEq/L (ref 135–145)
Total Bilirubin: 0.4 mg/dL (ref 0.2–1.2)
Total Protein: 7.1 g/dL (ref 6.0–8.3)

## 2021-02-24 LAB — LIPID PANEL
Cholesterol: 130 mg/dL (ref 0–200)
HDL: 58.3 mg/dL (ref >39.00)
LDL Cholesterol: 51 mg/dL (ref 0–99)
NonHDL: 71.44
Total CHOL/HDL Ratio: 2
Triglycerides: 102 mg/dL (ref 0.0–149.0)
VLDL: 20.4 mg/dL (ref 0.0–40.0)
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
## 2021-02-25 ENCOUNTER — Other Ambulatory Visit: Payer: Self-pay | Admitting: Family Medicine

## 2021-03-03 ENCOUNTER — Other Ambulatory Visit: Payer: Self-pay

## 2021-03-03 ENCOUNTER — Ambulatory Visit: Payer: Medicare PPO | Admitting: Physical Therapy

## 2021-03-03 DIAGNOSIS — R279 Unspecified lack of coordination: Secondary | ICD-10-CM

## 2021-03-03 DIAGNOSIS — M6281 Muscle weakness (generalized): Secondary | ICD-10-CM

## 2021-03-03 DIAGNOSIS — R278 Other lack of coordination: Secondary | ICD-10-CM | POA: Diagnosis not present

## 2021-03-03 DIAGNOSIS — R293 Abnormal posture: Secondary | ICD-10-CM
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
## 2021-03-09 ENCOUNTER — Ambulatory Visit: Payer: Medicare PPO | Admitting: Physical Therapy

## 2021-03-09 ENCOUNTER — Other Ambulatory Visit: Payer: Self-pay

## 2021-03-09 DIAGNOSIS — R278 Other lack of coordination: Secondary | ICD-10-CM

## 2021-03-09 DIAGNOSIS — R279 Unspecified lack of coordination: Secondary | ICD-10-CM | POA: Diagnosis not present

## 2021-03-09 DIAGNOSIS — M6281 Muscle weakness (generalized): Secondary | ICD-10-CM

## 2021-03-09 DIAGNOSIS — R293 Abnormal posture: Secondary | ICD-10-CM | POA: Diagnosis not present
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
## 2021-03-16 ENCOUNTER — Ambulatory Visit: Payer: Medicare PPO | Attending: Obstetrics and Gynecology | Admitting: Physical Therapy

## 2021-03-16 ENCOUNTER — Other Ambulatory Visit: Payer: Self-pay

## 2021-03-16 DIAGNOSIS — R279 Unspecified lack of coordination: Secondary | ICD-10-CM | POA: Insufficient documentation

## 2021-03-16 DIAGNOSIS — R293 Abnormal posture: Secondary | ICD-10-CM | POA: Diagnosis not present

## 2021-03-16 DIAGNOSIS — M6281 Muscle weakness (generalized): Secondary | ICD-10-CM | POA: Diagnosis not present

## 2021-03-16 DIAGNOSIS — R278 Other lack of coordination: Secondary | ICD-10-CM | POA: Diagnosis not present
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
## 2021-03-23 ENCOUNTER — Ambulatory Visit: Payer: Medicare PPO | Admitting: Physical Therapy

## 2021-03-23 ENCOUNTER — Other Ambulatory Visit: Payer: Self-pay

## 2021-03-23 ENCOUNTER — Encounter: Payer: Self-pay | Admitting: Physical Therapy

## 2021-03-23 DIAGNOSIS — R279 Unspecified lack of coordination: Secondary | ICD-10-CM

## 2021-03-23 DIAGNOSIS — R278 Other lack of coordination: Secondary | ICD-10-CM | POA: Diagnosis not present

## 2021-03-23 DIAGNOSIS — R293 Abnormal posture: Secondary | ICD-10-CM | POA: Diagnosis not present

## 2021-03-23 DIAGNOSIS — M6281 Muscle weakness (generalized): Secondary | ICD-10-CM
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
## 2021-03-30 ENCOUNTER — Encounter: Payer: Self-pay | Admitting: Physical Therapy

## 2021-03-30 ENCOUNTER — Other Ambulatory Visit: Payer: Self-pay

## 2021-03-30 ENCOUNTER — Ambulatory Visit: Payer: Medicare PPO | Admitting: Physical Therapy

## 2021-03-30 DIAGNOSIS — R293 Abnormal posture: Secondary | ICD-10-CM | POA: Diagnosis not present

## 2021-03-30 DIAGNOSIS — R278 Other lack of coordination: Secondary | ICD-10-CM

## 2021-03-30 DIAGNOSIS — M6281 Muscle weakness (generalized): Secondary | ICD-10-CM | POA: Diagnosis not present

## 2021-03-30 DIAGNOSIS — R279 Unspecified lack of coordination: Secondary | ICD-10-CM | POA: Diagnosis not present
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
## 2021-04-06 ENCOUNTER — Encounter: Payer: Medicare PPO | Admitting: Physical Therapy

## 2021-04-13 ENCOUNTER — Encounter: Payer: Medicare PPO | Admitting: Physical Therapy

## 2021-04-20 ENCOUNTER — Encounter: Payer: Medicare PPO | Admitting: Physical Therapy

## 2021-04-22 ENCOUNTER — Other Ambulatory Visit: Payer: Self-pay | Admitting: Family Medicine

## 2021-04-27 ENCOUNTER — Encounter: Payer: Medicare PPO | Admitting: Physical Therapy

## 2021-04-29 ENCOUNTER — Telehealth: Payer: Self-pay | Admitting: Family Medicine

## 2021-04-29 NOTE — Telephone Encounter (Signed)
Left message for patient to call back and schedule Medicare Annual Wellness Visit (AWV) either virtually or in office. Left  my jabber number [phone] ? ? ?Last AWV ;05/04/20 ?please schedule at anytime with [facility] Nurse Health Advisor 1 or 2 ? ? ? ?

## 2021-05-04 ENCOUNTER — Encounter: Payer: Medicare PPO | Admitting: Physical Therapy

## 2021-05-04 DIAGNOSIS — E119 Type 2 diabetes mellitus without complications: Secondary | ICD-10-CM | POA: Diagnosis not present

## 2021-05-04 DIAGNOSIS — H2513 Age-related nuclear cataract, bilateral: Secondary | ICD-10-CM | POA: Diagnosis not present

## 2021-05-04 DIAGNOSIS — H5069 Other mechanical strabismus: Secondary | ICD-10-CM | POA: Diagnosis not present

## 2021-05-04 LAB — HM DIABETES EYE EXAM

## 2021-05-05 ENCOUNTER — Encounter: Payer: Self-pay | Admitting: Family Medicine

## 2021-05-11 ENCOUNTER — Encounter: Payer: Medicare PPO | Admitting: Physical Therapy

## 2021-05-11 ENCOUNTER — Ambulatory Visit (INDEPENDENT_AMBULATORY_CARE_PROVIDER_SITE_OTHER): Payer: Medicare PPO

## 2021-05-11 VITALS — BP 122/70 | HR 78 | Temp 97.7°F | Ht 61.0 in | Wt 124.8 lb

## 2021-05-11 DIAGNOSIS — Z0000 Encounter for general adult medical examination without abnormal findings: Secondary | ICD-10-CM | POA: Diagnosis not present

## 2021-05-11 NOTE — Patient Instructions (Signed)
Stephanie Grimes , ?Thank you for taking time to come for your Medicare Wellness Visit. I appreciate your ongoing commitment to your health goals. Please review the following plan we discussed and let me know if I can assist you in the future.  ? ?Screening recommendations/referrals: ?Colonoscopy: completed 09/21/2017, due 09/22/2022 ?Mammogram: completed 11/30/2020, due 12/01/2021 ?Bone Density: completed 11/17/2020 ?Recommended yearly ophthalmology/optometry visit for glaucoma screening and checkup ?Recommended yearly dental visit for hygiene and checkup ? ?Vaccinations: ?Influenza vaccine: due 08/10/2021 ?Pneumococcal vaccine: due ?Tdap vaccine: completed 06/07/2013, due 06/08/2023 ?Shingles vaccine: completed   ?Covid-19: 10/30/2020,10/18/2019, 02/25/2019, 02/04/2019 ? ?Advanced directives: Please bring a copy of your POA (Power of Attorney) and/or Living Will to your next appointment.  ? ?Conditions/risks identified: none ? ?Next appointment: Follow up in one year for your annual wellness visit  ? ? ?Preventive Care 71 Years and Older, Female ?Preventive care refers to lifestyle choices and visits with your health care provider that can promote health and wellness. ?What does preventive care include? ?A yearly physical exam. This is also called an annual well check. ?Dental exams once or twice a year. ?Routine eye exams. Ask your health care provider how often you should have your eyes checked. ?Personal lifestyle choices, including: ?Daily care of your teeth and gums. ?Regular physical activity. ?Eating a healthy diet. ?Avoiding tobacco and drug use. ?Limiting alcohol use. ?Practicing safe sex. ?Taking low-dose aspirin every day. ?Taking vitamin and mineral supplements as recommended by your health care provider. ?What happens during an annual well check? ?The services and screenings done by your health care provider during your annual well check will depend on your age, overall health, lifestyle risk factors, and family  history of disease. ?Counseling  ?Your health care provider may ask you questions about your: ?Alcohol use. ?Tobacco use. ?Drug use. ?Emotional well-being. ?Home and relationship well-being. ?Sexual activity. ?Eating habits. ?History of falls. ?Memory and ability to understand (cognition). ?Work and work Statistician. ?Reproductive health. ?Screening  ?You may have the following tests or measurements: ?Height, weight, and BMI. ?Blood pressure. ?Lipid and cholesterol levels. These may be checked every 5 years, or more frequently if you are over 68 years old. ?Skin check. ?Lung cancer screening. You may have this screening every year starting at age 103 if you have a 30-pack-year history of smoking and currently smoke or have quit within the past 15 years. ?Fecal occult blood test (FOBT) of the stool. You may have this test every year starting at age 65. ?Flexible sigmoidoscopy or colonoscopy. You may have a sigmoidoscopy every 5 years or a colonoscopy every 10 years starting at age 6. ?Hepatitis C blood test. ?Hepatitis B blood test. ?Sexually transmitted disease (STD) testing. ?Diabetes screening. This is done by checking your blood sugar (glucose) after you have not eaten for a while (fasting). You may have this done every 1-3 years. ?Bone density scan. This is done to screen for osteoporosis. You may have this done starting at age 39. ?Mammogram. This may be done every 1-2 years. Talk to your health care provider about how often you should have regular mammograms. ?Talk with your health care provider about your test results, treatment options, and if necessary, the need for more tests. ?Vaccines  ?Your health care provider may recommend certain vaccines, such as: ?Influenza vaccine. This is recommended every year. ?Tetanus, diphtheria, and acellular pertussis (Tdap, Td) vaccine. You may need a Td booster every 10 years. ?Zoster vaccine. You may need this after age 64. ?Pneumococcal 13-valent conjugate (PCV13)  vaccine. One dose is recommended after age 45. ?Pneumococcal polysaccharide (PPSV23) vaccine. One dose is recommended after age 44. ?Talk to your health care provider about which screenings and vaccines you need and how often you need them. ?This information is not intended to replace advice given to you by your health care provider. Make sure you discuss any questions you have with your health care provider. ?Document Released: 01/23/2015 Document Revised: 09/16/2015 Document Reviewed: 10/28/2014 ?Elsevier Interactive Patient Education ? 2017 [place]. ? ?Fall Prevention in the Home ?Falls can cause injuries. They can happen to people of all ages. There are many things you can do to make your home safe and to help prevent falls. ?What can I do on the outside of my home? ?Regularly fix the edges of walkways and driveways and fix any cracks. ?Remove anything that might make you trip as you walk through a door, such as a raised step or threshold. ?Trim any bushes or trees on the path to your home. ?Use bright outdoor lighting. ?Clear any walking paths of anything that might make someone trip, such as rocks or tools. ?Regularly check to see if handrails are loose or broken. Make sure that both sides of any steps have handrails. ?Any raised decks and porches should have guardrails on the edges. ?Have any leaves, snow, or ice cleared regularly. ?Use sand or salt on walking paths during winter. ?Clean up any spills in your garage right away. This includes oil or grease spills. ?What can I do in the bathroom? ?Use night lights. ?Install grab bars by the toilet and in the tub and shower. Do not use towel bars as grab bars. ?Use non-skid mats or decals in the tub or shower. ?If you need to sit down in the shower, use a plastic, non-slip stool. ?Keep the floor dry. Clean up any water that spills on the floor as soon as it happens. ?Remove soap buildup in the tub or shower regularly. ?Attach bath mats securely with  double-sided non-slip rug tape. ?Do not have throw rugs and other things on the floor that can make you trip. ?What can I do in the bedroom? ?Use night lights. ?Make sure that you have a light by your bed that is easy to reach. ?Do not use any sheets or blankets that are too big for your bed. They should not hang down onto the floor. ?Have a firm chair that has side arms. You can use this for support while you get dressed. ?Do not have throw rugs and other things on the floor that can make you trip. ?What can I do in the kitchen? ?Clean up any spills right away. ?Avoid walking on wet floors. ?Keep items that you use a lot in easy-to-reach places. ?If you need to reach something above you, use a strong step stool that has a grab bar. ?Keep electrical cords out of the way. ?Do not use floor polish or wax that makes floors slippery. If you must use wax, use non-skid floor wax. ?Do not have throw rugs and other things on the floor that can make you trip. ?What can I do with my stairs? ?Do not leave any items on the stairs. ?Make sure that there are handrails on both sides of the stairs and use them. Fix handrails that are broken or loose. Make sure that handrails are as long as the stairways. ?Check any carpeting to make sure that it is firmly attached to the stairs. Fix any carpet that is loose or  worn. ?Avoid having throw rugs at the top or bottom of the stairs. If you do have throw rugs, attach them to the floor with carpet tape. ?Make sure that you have a light switch at the top of the stairs and the bottom of the stairs. If you do not have them, ask someone to add them for you. ?What else can I do to help prevent falls? ?Wear shoes that: ?Do not have high heels. ?Have rubber bottoms. ?Are comfortable and fit you well. ?Are closed at the toe. Do not wear sandals. ?If you use a stepladder: ?Make sure that it is fully opened. Do not climb a closed stepladder. ?Make sure that both sides of the stepladder are locked  into place. ?Ask someone to hold it for you, if possible. ?Clearly mark and make sure that you can see: ?Any grab bars or handrails. ?First and last steps. ?Where the edge of each step is. ?Use tools that help you move ar
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
## 2021-05-29 ENCOUNTER — Other Ambulatory Visit: Payer: Self-pay | Admitting: Family Medicine

## 2021-05-29 DIAGNOSIS — E118 Type 2 diabetes mellitus with unspecified complications: Secondary | ICD-10-CM

## 2021-06-02 ENCOUNTER — Ambulatory Visit (INDEPENDENT_AMBULATORY_CARE_PROVIDER_SITE_OTHER): Payer: Medicare PPO | Admitting: Family Medicine

## 2021-06-02 ENCOUNTER — Encounter: Payer: Self-pay | Admitting: Family Medicine

## 2021-06-02 VITALS — BP 100/60 | HR 85 | Temp 98.0°F | Ht 61.0 in | Wt 122.5 lb

## 2021-06-02 DIAGNOSIS — Z23 Encounter for immunization: Secondary | ICD-10-CM | POA: Diagnosis not present

## 2021-06-02 DIAGNOSIS — M818 Other osteoporosis without current pathological fracture: Secondary | ICD-10-CM

## 2021-06-02 DIAGNOSIS — E1169 Type 2 diabetes mellitus with other specified complication: Secondary | ICD-10-CM | POA: Diagnosis not present

## 2021-06-02 DIAGNOSIS — K219 Gastro-esophageal reflux disease without esophagitis: Secondary | ICD-10-CM | POA: Diagnosis not present

## 2021-06-02 DIAGNOSIS — E118 Type 2 diabetes mellitus with unspecified complications: Secondary | ICD-10-CM | POA: Diagnosis not present

## 2021-06-02 DIAGNOSIS — R252 Cramp and spasm: Secondary | ICD-10-CM

## 2021-06-02 DIAGNOSIS — Z0000 Encounter for general adult medical examination without abnormal findings: Secondary | ICD-10-CM | POA: Diagnosis not present

## 2021-06-02 DIAGNOSIS — I83893 Varicose veins of bilateral lower extremities with other complications: Secondary | ICD-10-CM | POA: Diagnosis not present

## 2021-06-02 DIAGNOSIS — E538 Deficiency of other specified B group vitamins: Secondary | ICD-10-CM | POA: Diagnosis not present

## 2021-06-02 DIAGNOSIS — E785 Hyperlipidemia, unspecified: Secondary | ICD-10-CM

## 2021-06-02 LAB — COMPREHENSIVE METABOLIC PANEL
ALT: 20 U/L (ref 0–35)
AST: 18 U/L (ref 0–37)
Albumin: 4.6 g/dL (ref 3.5–5.2)
Alkaline Phosphatase: 65 U/L (ref 39–117)
BUN: 20 mg/dL (ref 6–23)
CO2: 31 mEq/L (ref 19–32)
Calcium: 10.9 mg/dL — ABNORMAL HIGH (ref 8.4–10.5)
Chloride: 102 mEq/L (ref 96–112)
Creatinine, Ser: 0.75 mg/dL (ref 0.40–1.20)
GFR: 80.44 mL/min (ref >60.00)
Glucose, Bld: 115 mg/dL — ABNORMAL HIGH (ref 70–99)
Potassium: 4.9 mEq/L (ref 3.5–5.1)
Sodium: 140 mEq/L (ref 135–145)
Total Bilirubin: 0.5 mg/dL (ref 0.2–1.2)
Total Protein: 7.3 g/dL (ref 6.0–8.3)

## 2021-06-02 LAB — B12 AND FOLATE PANEL
Folate: 10.1 ng/mL (ref >5.9)
Vitamin B-12: 848 pg/mL (ref 211–911)

## 2021-06-02 LAB — VITAMIN D 25 HYDROXY (VIT D DEFICIENCY, FRACTURES): VITD: 56.47 ng/mL (ref 30.00–100.00)

## 2021-06-02 LAB — POCT GLYCOSYLATED HEMOGLOBIN (HGB A1C): Hemoglobin A1C: 6.1 % — AB (ref 4.0–5.6)

## 2021-06-02 LAB — FERRITIN: Ferritin: 67 ng/mL (ref 10.0–291.0)

## 2021-06-02 LAB — MAGNESIUM: Magnesium: 2.2 mg/dL (ref 1.5–2.5)
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
## 2021-07-02 ENCOUNTER — Other Ambulatory Visit: Payer: Self-pay | Admitting: *Deleted

## 2021-07-02 DIAGNOSIS — I8393 Asymptomatic varicose veins of bilateral lower extremities: Secondary | ICD-10-CM

## 2021-07-16 IMAGING — DX DG CHEST 2V
2 series · 2 of 2 positions shown · non-contrast
Comparison: None.

CLINICAL DATA: Cough

EXAM:
CHEST - 2 VIEW

[chest pa]
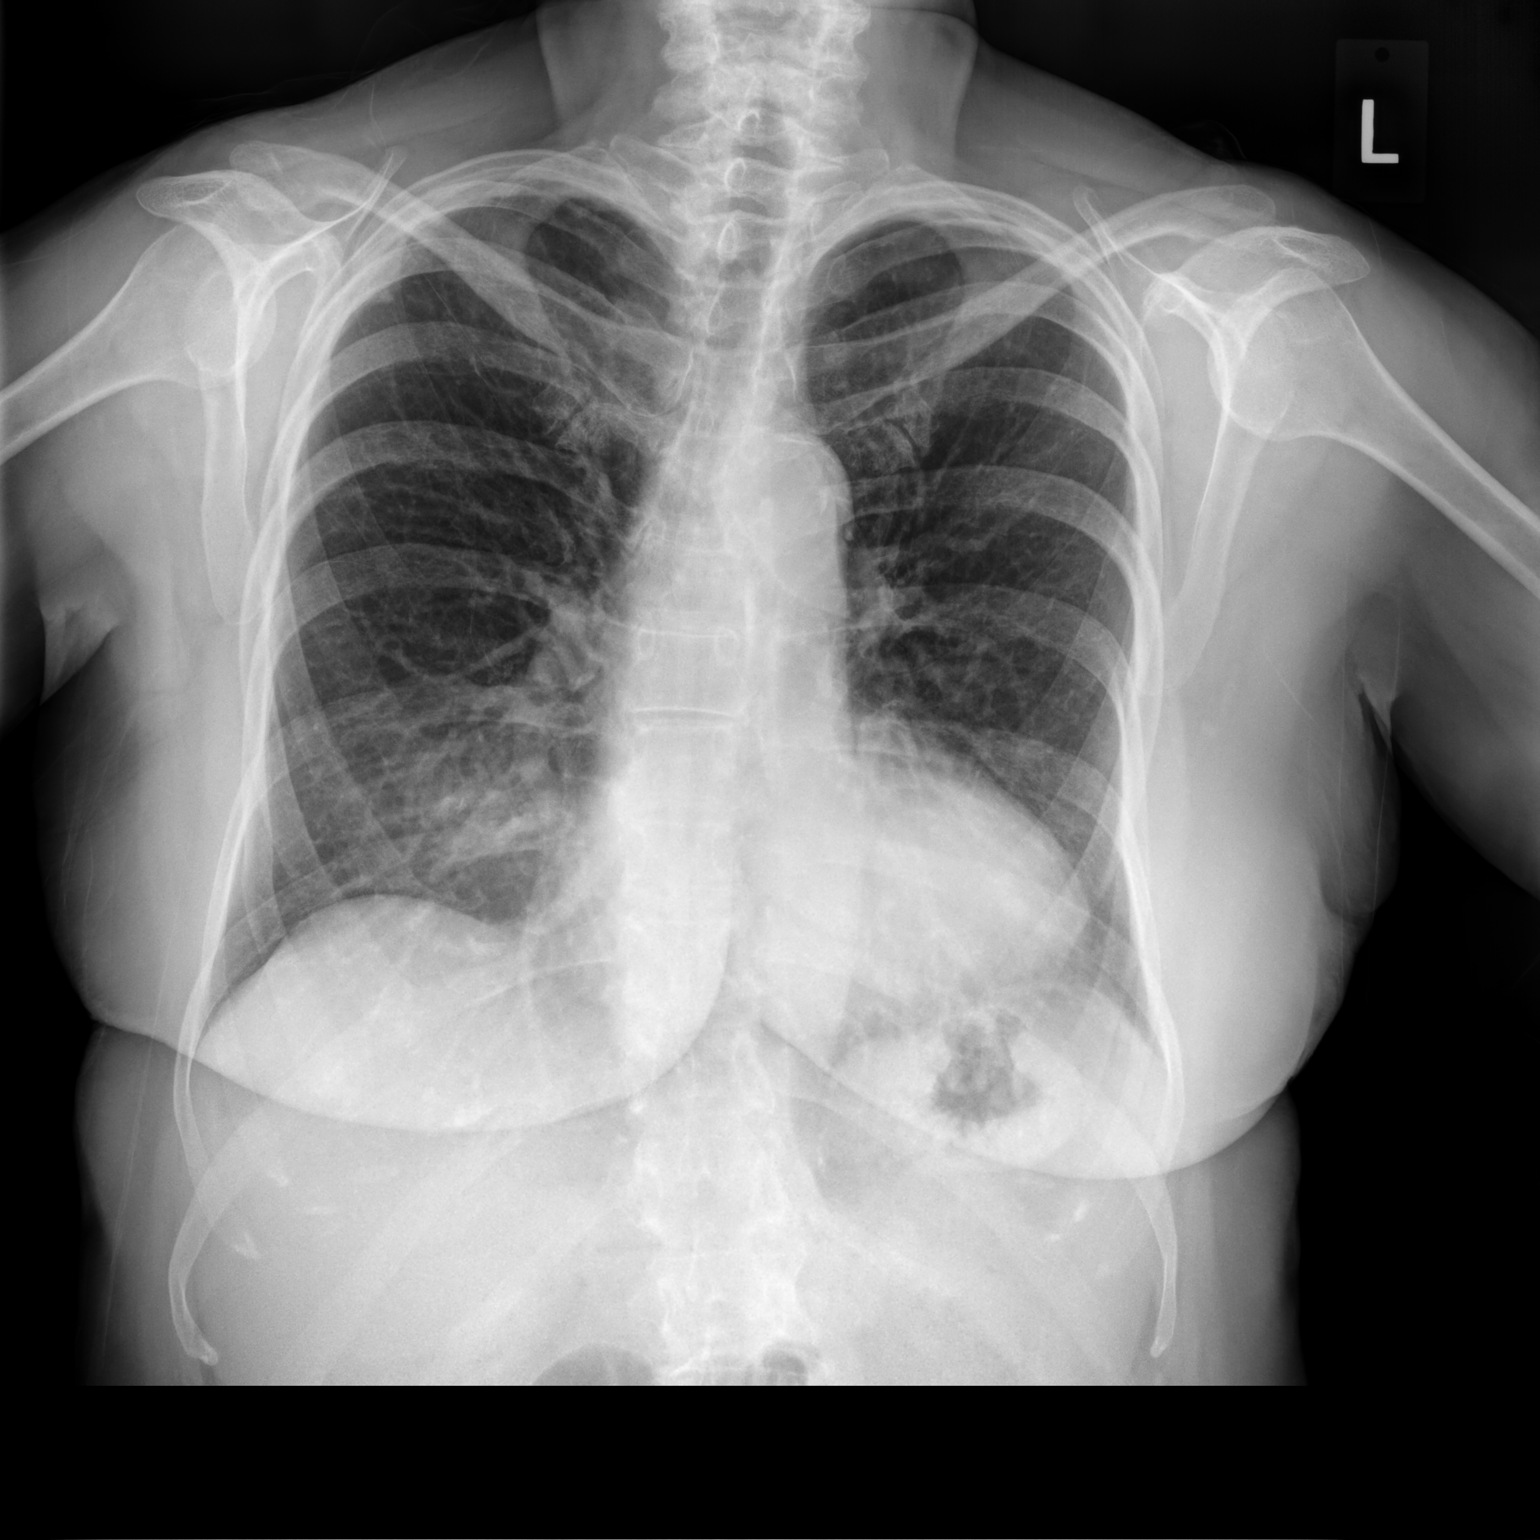

[chest lat]
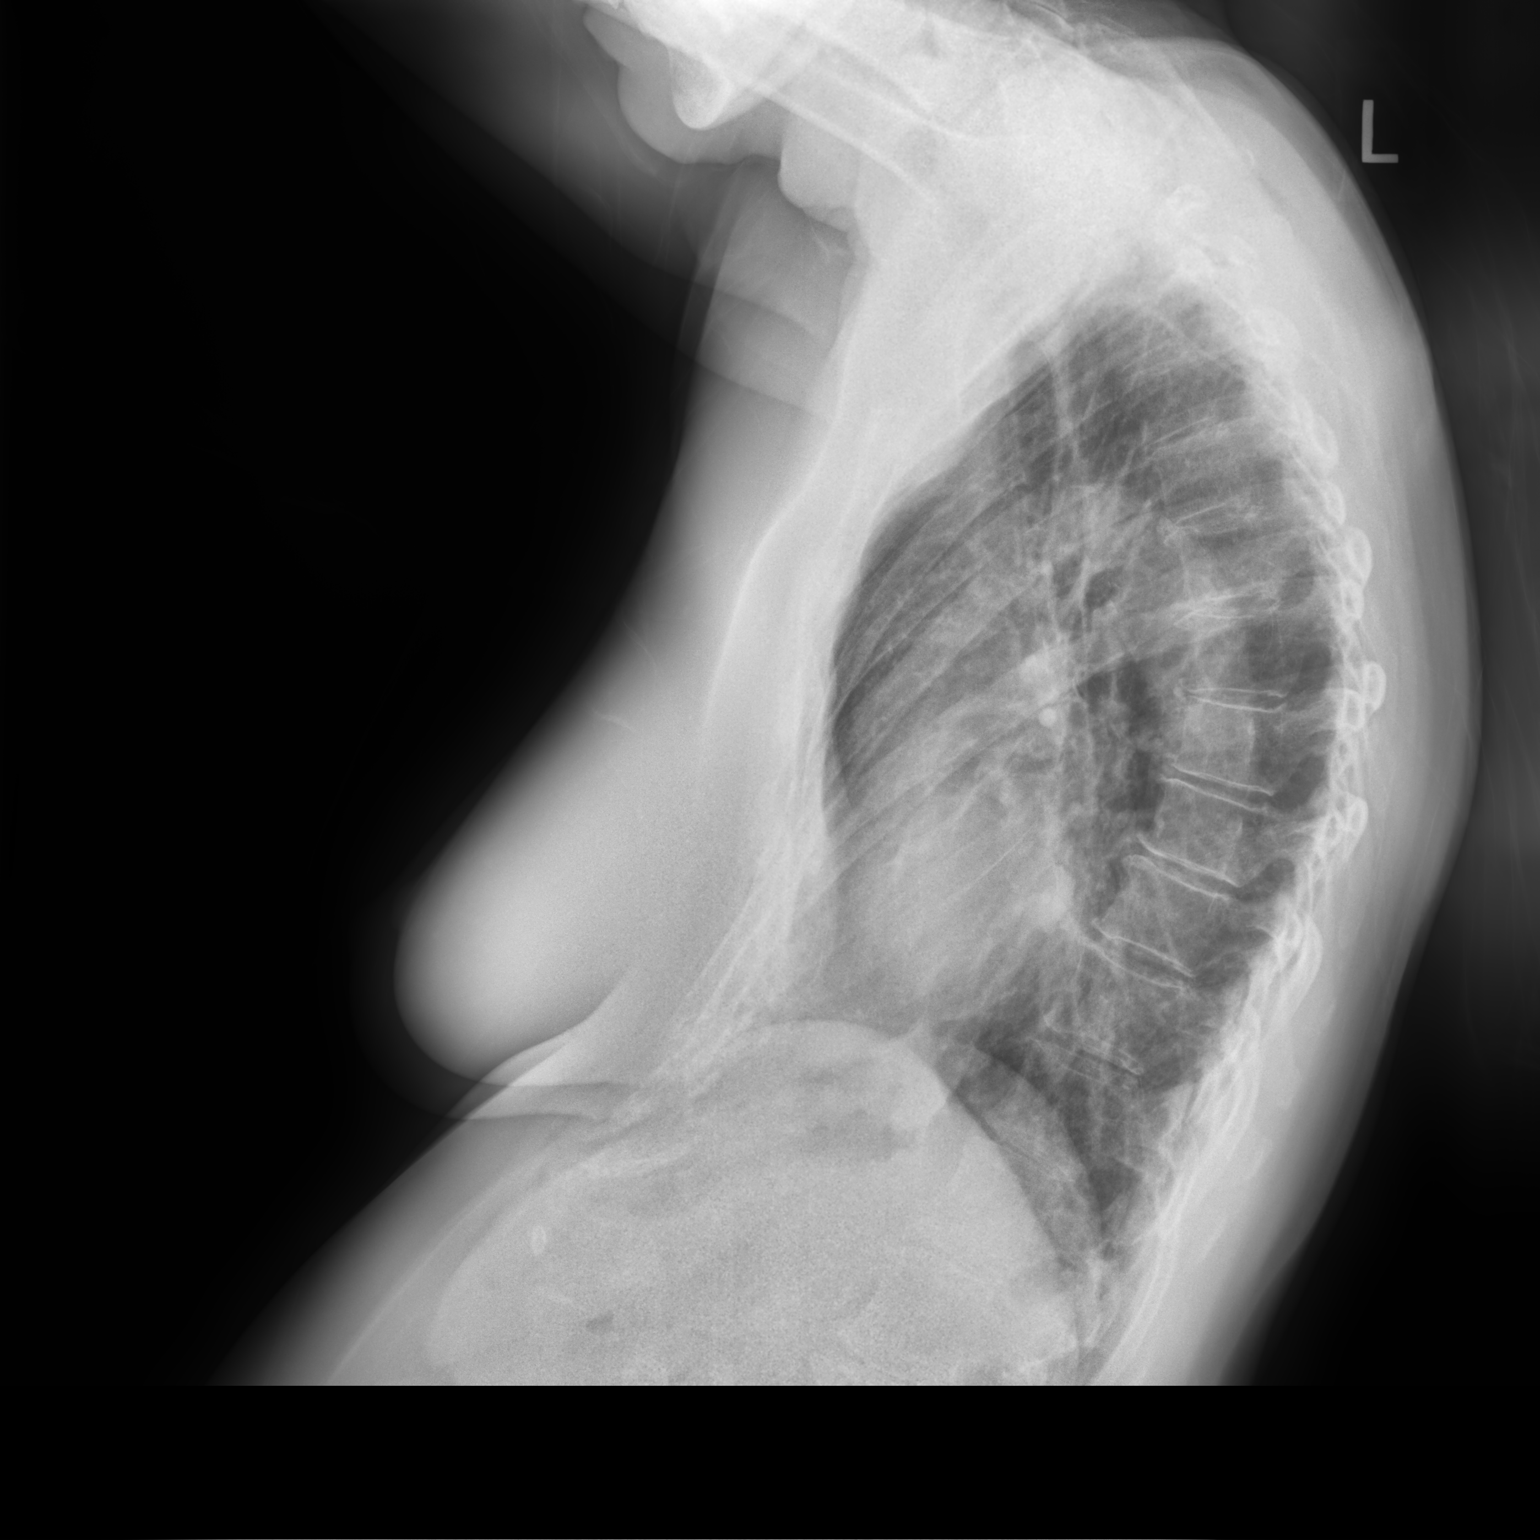

[2 of 2 positions shown; findings below may reference images not displayed]

FINDINGS: The heart size and mediastinal contours are within normal limits.
Both lungs are clear. Pectus deformity. Disc degenerative disease of
the thoracic spine.
IMPRESSION: No acute abnormality of the lungs.
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
## 2021-07-19 ENCOUNTER — Telehealth: Payer: Self-pay | Admitting: Pharmacist

## 2021-07-19 NOTE — Chronic Care Management (AMB) (Signed)
    Chronic Care Management Pharmacy Assistant   Name: Stephanie Grimes  MRN: [id number] DOB: December 19, 1950  07/19/21 APPOINTMENT REMINDER  Patient was reminded to have all medications, supplements and any blood glucose and blood pressure readings available for review with Jeni Salles, Pharm. D, for telephone visit on 07/19/21 at 8:30.    Care Gaps: COVID Booster - Overdue AWV- 05/11/21 CCM- 07/20/21 Lab Results  Component Value Date   HGBA1C 6.1 (A) 06/02/2021    Star Rating Drug: Atorvastatin (Lipitor) 40 mg - Last filled 05/30/21 90 DS at [facility] Losartan (Cozaar) 25 mg - Last filled 05/31/21 90 DS at [facility] Empagliflozin- Metformin  (Synjardy) 5-500 mg - Last filled 04/25/21 90 DS at [facility]  Any gaps in medications fill history? none     Medications: Outpatient Encounter Medications as of 07/19/2021  Medication Sig   atorvastatin (LIPITOR) 40 MG tablet TAKE 1 TABLET BY MOUTH DAILY   blood glucose meter kit and supplies KIT Dispense based on patient and insurance preference. Use up to four times daily as directed.   Calcium Carbonate-Vit D-Min (CALCIUM 1200 PO) Take by mouth daily.   docusate sodium (COLACE) 100 MG capsule Take 100 mg by mouth 2 (two) times daily.   fluticasone (FLONASE) 50 MCG/ACT nasal spray Place 2 sprays into the nose daily. (Patient taking differently: Place 2 sprays into the nose as needed.)   gabapentin (NEURONTIN) 100 MG capsule Take 1 capsule (100 mg total) by mouth 3 (three) times daily as needed. Take 1 tablet morning, 2 every night   ibuprofen (ADVIL,MOTRIN) 200 MG tablet Take 200 mg by mouth as needed for mild pain.    loratadine-pseudoephedrine (CLARITIN-D 24 HOUR) 10-240 MG 24 hr tablet Take 1 tablet by mouth daily.   losartan (COZAAR) 25 MG tablet TAKE 1 TABLET(25 MG) BY MOUTH DAILY   Multiple Vitamin (MULTIVITAMIN) capsule Take 1 capsule by mouth daily.   OVER THE COUNTER MEDICATION OTC medication for reflux once a day-cannot recall name    SYNJARDY 5-500 MG TABS TAKE 1 TABLET BY MOUTH TWICE DAILY   No facility-administered encounter medications on file as of 07/19/2021.     [place] Clinical Pharmacist Assistant [phone]

## 2021-07-19 NOTE — Progress Notes (Unsigned)
Chronic Care Management Pharmacy Note  07/20/2021 Name:  Stephanie Grimes MRN:  [id number] DOB:  07-Jan-1951  Summary: A1c at goal < 7% LDL at goal < 70 Pt continues to take some OTC calcium   Recommendations/Changes made from today's visit: -Consider repeat BMP for calcium level  -Recommended for pt to purchase a BP cuff -Recommended alternating different times of day for checking blood sugars  Plan: DM assessment in 6 months  Subjective: Stephanie Grimes is an 71 y.o. year old female who is a primary patient of Koberlein, Steele Berg, MD (Inactive).  The CCM team was consulted for assistance with disease management and care coordination needs.    Engaged with patient by telephone for follow up visit in response to provider referral for pharmacy case management and/or care coordination services.   Consent to Services:  The patient was given information about Chronic Care Management services, agreed to services, and gave verbal consent prior to initiation of services.  Please see initial visit note for detailed documentation.   Patient Care Team: Caren Macadam, MD (Inactive) as PCP - General (Family Medicine) Nunzio Cobbs, MD as Consulting Physician (Obstetrics and Gynecology) Hilarie Fredrickson, Lajuan Lines, MD as Consulting Physician (Gastroenterology) Viona Gilmore, [facility] as Pharmacist (Pharmacist)  Recent office visits: 06/02/21 Micheline Rough MD: Patient presented for annual exam. Administered Prevar20. D/c'd calcium supplements. Plan to recheck in 4-6 months. Referred to vascular surgery. Performed foot exam.  05/11/21 Glenna Durand, LPN: Patient presented for AWV.  Recent consult visits: 06/29/21 Carol Ada, MD (ENT): Patient presented for dysphonia follow up. Plan to continue trial of gabapentin once daily.  03/30/21 Stacy Gardner, PT (outpatient rehab): Patient presented for PT treatment for abnormal posture.  01/13/21 Stacy Gardner, PT (rehab): Patient presented for PT  treatment for lack of coordination.  10/28/20 Hosie Poisson, MD (gynecology): Patient presented for pessary check.  10/20/20 Hosie Poisson, MD (gynecology): Patient presented for pessary fitting.  10/14/20 Hosie Poisson, MD (gynecology): Patient presented for breast and pelvic exam. Follow up for pessary fitting.  06/24/20 Carol Ada MD ( ENT) - presented to clinic for vocal fold atrophy, cough and dysphonia. She tapered to 100 mg gabapentin BID and 10 mg of nortriptyline. Plan to continue tapering.  Hospital visits: None in previous 6 months  Objective:  Lab Results  Component Value Date   CREATININE 0.75 06/02/2021   BUN 20 06/02/2021   GFR 80.44 06/02/2021   GFRNONAA 78 12/09/2019   GFRAA 90 12/09/2019   NA 140 06/02/2021   K 4.9 06/02/2021   CALCIUM 10.9 (H) 06/02/2021   CO2 31 06/02/2021   GLUCOSE 115 (H) 06/02/2021    Lab Results  Component Value Date/Time   HGBA1C 6.1 (A) 06/02/2021 09:38 AM   HGBA1C 6.8 (H) 02/23/2021 09:13 AM   HGBA1C 7.5 (H) 08/27/2020 09:08 AM   GFR 80.44 06/02/2021 10:38 AM   GFR 89.12 02/23/2021 09:13 AM   MICROALBUR <0.7 02/23/2021 09:13 AM   MICROALBUR <0.7 06/03/2019 11:04 AM    Last diabetic Eye exam:  Lab Results  Component Value Date/Time   HMDIABEYEEXA No Retinopathy 05/04/2021 12:00 AM    Last diabetic Foot exam: No results found for: "HMDIABFOOTEX"   Lab Results  Component Value Date   CHOL 130 02/23/2021   HDL 58.30 02/23/2021   LDLCALC 51 02/23/2021   LDLDIRECT 146.0 05/06/2016   TRIG 102.0 02/23/2021   CHOLHDL 2 02/23/2021       Latest Ref Rng &  Units 06/02/2021   10:38 AM 02/23/2021    9:13 AM 08/27/2020    9:08 AM  Hepatic Function  Total Protein 6.0 - 8.3 g/dL 7.3  7.1  6.9   Albumin 3.5 - 5.2 g/dL 4.6  4.6  4.3   AST 0 - 37 U/L 18  17  23    ALT 0 - 35 U/L 20  19  40   Alk Phosphatase 39 - 117 U/L 65  54  71   Total Bilirubin 0.2 - 1.2 mg/dL 0.5  0.4  0.4     Lab Results  Component  Value Date/Time   TSH 1.41 05/24/2017 08:54 AM   TSH 1.30 05/06/2016 01:35 PM       Latest Ref Rng & Units 02/23/2021    9:13 AM 08/27/2020    9:08 AM 12/09/2019   10:21 AM  CBC  WBC 4.0 - 10.5 K/uL 6.9  7.2  7.2   Hemoglobin 12.0 - 15.0 g/dL 13.3  13.2  11.6   Hematocrit 36.0 - 46.0 % 40.4  39.4  35.2   Platelets 150.0 - 400.0 K/uL 258.0  273.0  260     Lab Results  Component Value Date/Time   VD25OH 56.47 06/02/2021 10:38 AM   VD25OH 47 12/09/2019 10:21 AM   VD25OH 56.08 06/03/2019 11:04 AM    Clinical ASCVD: No  The 10-year ASCVD risk score (Arnett DK, et al., 2019) is: 14.3%   Values used to calculate the score:     Age: 62 years     Sex: Female     Is Non-Hispanic African American: No     Diabetic: Yes     Tobacco smoker: No     Systolic Blood Pressure: 299 mmHg     Is BP treated: Yes     HDL Cholesterol: 58.3 mg/dL     Total Cholesterol: 130 mg/dL       06/02/2021    9:35 AM 05/11/2021    2:36 PM 06/03/2020   11:22 AM  Depression screen PHQ 2/9  Decreased Interest 0 0 0  Down, Depressed, Hopeless 0 0 0  PHQ - 2 Score 0 0 0  Altered sleeping 0  1  Tired, decreased energy 0  1  Change in appetite 0  0  Feeling bad or failure about yourself  0  0  Trouble concentrating 0  0  Moving slowly or fidgety/restless 0  0  Suicidal thoughts 0  0  PHQ-9 Score 0  2       Social History   Tobacco Use  Smoking Status Never  Smokeless Tobacco Never   BP Readings from Last 3 Encounters:  06/02/21 100/60  05/11/21 122/70  10/28/20 138/70   Pulse Readings from Last 3 Encounters:  06/02/21 85  05/11/21 78  10/28/20 96   Wt Readings from Last 3 Encounters:  06/02/21 122 lb 8 oz (55.6 kg)  05/11/21 124 lb 12.8 oz (56.6 kg)  10/28/20 130 lb (59 kg)   BMI Readings from Last 3 Encounters:  06/02/21 23.15 kg/m  05/11/21 23.58 kg/m  10/28/20 24.77 kg/m    Assessment/Interventions: Review of patient past medical history, allergies, medications, health  status, including review of consultants reports, laboratory and other test data, was performed as part of comprehensive evaluation and provision of chronic care management services.   SDOH:  (Social Determinants of Health) assessments and interventions performed: Yes   SDOH Screenings   Alcohol Screen: Low Risk  (05/04/2020)   Alcohol  Screen    Last Alcohol Screening Score (AUDIT): 0  Depression (PHQ2-9): Low Risk  (06/02/2021)   Depression (PHQ2-9)    PHQ-2 Score: 0  Financial Resource Strain: Low Risk  (05/11/2021)   Overall Financial Resource Strain (CARDIA)    Difficulty of Paying Living Expenses: Not hard at all  Food Insecurity: No Food Insecurity (05/11/2021)   Hunger Vital Sign    Worried About Running Out of Food in the Last Year: Never true    [place] in the Last Year: Never true  Housing: Low Risk  (05/04/2020)   Housing    Last Housing Risk Score: 0  Physical Activity: Insufficiently Active (05/11/2021)   Exercise Vital Sign    Days of Exercise per Week: 3 days    Minutes of Exercise per Session: 20 min  Social Connections: Socially Integrated (05/04/2020)   Social Connection and Isolation Panel [NHANES]    Frequency of Communication with Friends and Family: More than three times a week    Frequency of Social Gatherings with Friends and Family: More than three times a week    Attends Religious Services: 1 to 4 times per year    Active Member of [employer] or Organizations: Yes    Attends Archivist Meetings: More than 4 times per year    Marital Status: Married  Stress: No Stress Concern Present (05/11/2021)   [place] of Stress : Not at all  Tobacco Use: Low Risk  (06/02/2021)   Patient History    Smoking Tobacco Use: Never    Smokeless Tobacco Use: Never    Passive Exposure: Not on file  Transportation Needs: No Transportation Needs (05/11/2021)   PRAPARE - Transportation    Lack of  Transportation (Medical): No    Lack of Transportation (Non-Medical): No   CCM Care Plan  Allergies  Allergen Reactions   Sulfa Antibiotics Swelling    Extremities swell up, no airway problem   Omeprazole     diarrhea    Medications Reviewed Today     Reviewed by Viona Gilmore, [facility] (Pharmacist) on 07/20/21 at [place] List Status: <None>   Medication Order Taking? Sig Documenting Provider Last Dose Status Informant  atorvastatin (LIPITOR) 40 MG tablet [id number]  TAKE 1 TABLET BY MOUTH DAILY Koberlein, Junell C, MD  Active   blood glucose meter kit and supplies KIT [id number]  Dispense based on patient and insurance preference. Use up to four times daily as directed. Caren Macadam, MD  Active   Calcium Carbonate-Vit D-Min (CALCIUM 1200 PO) [id number]  Take by mouth daily. [provider]  Active   docusate sodium (COLACE) 100 MG capsule [id number]  Take 100 mg by mouth 2 (two) times daily. [provider]  Active   fluticasone (FLONASE) 50 MCG/ACT nasal spray [id number]  Place 2 sprays into the nose daily.  Patient taking differently: Place 2 sprays into the nose as needed.   Marletta Lor, MD  Active   gabapentin (NEURONTIN) 100 MG capsule [id number] Yes Take 1 capsule (100 mg total) by mouth 3 (three) times daily as needed. Take 1 tablet morning, 2 every night  Patient taking differently: Take 100 mg by mouth at bedtime.   Caren Macadam, MD Taking Active   ibuprofen (ADVIL,MOTRIN) 200 MG tablet [id number]  Take 200 mg by mouth as needed for mild pain.  [provider]  Active Self  loratadine-pseudoephedrine (  CLARITIN-D 24 HOUR) 10-240 MG 24 hr tablet [id number]  Take 1 tablet by mouth daily. Caren Macadam, MD  Active   losartan (COZAAR) 25 MG tablet [id number]  TAKE 1 TABLET(25 MG) BY MOUTH DAILY Koberlein, Junell C, MD  Active   Multiple Vitamin (MULTIVITAMIN) capsule [id number]  Take 1 capsule by mouth daily. [provider]   Active Self  OVER THE COUNTER MEDICATION [id number]  OTC medication for reflux once a day-cannot recall name [provider]  Active   SYNJARDY 5-500 MG TABS [id number]  TAKE 1 TABLET BY MOUTH TWICE DAILY Caren Macadam, MD  Active             Patient Active Problem List   Diagnosis Date Noted   Hyperlipidemia associated with type 2 diabetes mellitus ([place]) 12/04/2019   Post-nasal drainage 11/22/2017   Vertigo 11/22/2017   Osteoporosis 08/24/2016   Type II diabetes mellitus with complication ([place]) 24/82/5003   History of colonic polyps    Esophageal reflux 09/24/2010   Globus sensation 09/24/2010   Raynauds phenomenon 09/24/2010    Immunization History  Administered Date(s) Administered   Influenza, High Dose Seasonal PF 11/24/2016, 11/22/2017, 11/07/2018   Influenza-Unspecified 11/04/2018, 10/18/2019   PFIZER(Purple Top)SARS-COV-2 Vaccination 02/04/2019, 02/25/2019, 10/18/2019   PNEUMOCOCCAL CONJUGATE-20 06/02/2021   Pfizer Covid-19 Vaccine Bivalent Booster 36yr & up 10/30/2020   Pneumococcal Conjugate-13 11/22/2017   Tdap 06/07/2013   Zoster Recombinat (Shingrix) 07/09/2019, 09/27/2019   Patient reports her average BG has been 120 in the mornings within the last week. She was having readings more in the 110s previously. Patient has really been trying with her meals and eating lots of veggies.  Patient cut back on the amount of calcium she is taking but didn't want to cut back all together because of her osteoporosis.   Patient is only taking one gabapentin per night now and her cough is considerably better.  Conditions to be addressed/monitored:  Hypertension, Hyperlipidemia, Diabetes, GERD, Osteoporosis, and Allergic Rhinitis  Conditions addressed this visit: Diabetes, osteoporosis  Care Plan : CCM Pharmacy Care Plan  Updates made by PViona Gilmore [place] 07/20/2021 12:00 AM     Problem: Problem: Hyperlipidemia, Diabetes, GERD, Osteoporosis and  Allergic Rhinitis      Long-Range Goal: Patient-Specific Goal   Start Date: 05/27/2020  Expected End Date: 05/27/2021  Recent Progress: On track  Priority: High  Note:   Current Barriers:  Unable to independently monitor therapeutic efficacy Unable to achieve control of diabetes   Pharmacist Clinical Goal(s):  Patient will achieve adherence to monitoring guidelines and medication adherence to achieve therapeutic efficacy achieve control of diabetes as evidenced by A1c  through collaboration with PharmD and provider.   Interventions: 1:1 collaboration with KCaren Macadam MD regarding development and update of comprehensive plan of care as evidenced by provider attestation and co-signature Inter-disciplinary care team collaboration (see longitudinal plan of care) Comprehensive medication review performed; medication list updated in electronic medical record  Hypertension (BP goal <130/80) -Controlled -Current treatment: Losartan 25 mg 1 tablet daily - Appropriate, Effective, Safe, Accessible -Medications previously tried: lisinopril (cough)  -Current home readings: does not check (needs to purchase a BP cuff) -Current dietary habits: limits red meat and eats lots of vegetables -Current exercise habits: some walking -Denies hypotensive/hypertensive symptoms -Educated on Exercise goal of 150 minutes per week; Importance of home blood pressure monitoring; Proper BP monitoring technique; -Counseled to monitor BP at home weekly, document, and provide log at future appointments -Counseled  on diet and exercise extensively Recommended to continue current medication Recommended purchasing a BP cuff.  Hyperlipidemia: (LDL goal < 70) -Controlled -Current treatment: Atorvastatin 40 mg 1 tablet daily - Appropriate, Effective, Safe, Accessible -Medications previously tried: none  -Current dietary patterns: limits take out and red meat -Current exercise habits: some walking -Educated  on Cholesterol goals;  Benefits of statin for ASCVD risk reduction; Importance of limiting foods high in cholesterol; Exercise goal of 150 minutes per week; -Counseled on diet and exercise extensively Recommended to continue current medication  Diabetes (A1c goal <7%) -Controlled -Current medications: Synjardy 5-500 mg 1 tablet twice daily - Appropriate, Effective, Safe, Accessible -Medications previously tried: none  -Current home glucose readings fasting glucose: lowest was 89, low 110s, highest was 130 post prandial glucose: n/a -Denies hypoglycemic/hyperglycemic symptoms -Current meal patterns:  breakfast: scrambled eggs, yogurt, and blueberries Lunch/dinner: chicken taco with beans and a salad snacks: did not discuss drinks: did not discuss -Current exercise: some walking  -Educated on A1c and blood sugar goals; Exercise goal of 150 minutes per week; Benefits of routine self-monitoring of blood sugar; Carbohydrate counting and/or plate method -Counseled to check feet daily and get yearly eye exams -Counseled on diet and exercise extensively Recommended to continue current medication Educated on goals for blood sugars (morning 80-130, 1-2 hours after eating < 180 and before bedtime 100-150).   Osteoporosis (Goal prevent fractures) -Controlled -Last DEXA Scan: 11/2020  T-Score femoral neck: -2.2  T-Score total hip: n/a  T-Score lumbar spine: 0.7  T-Score forearm radius: n/a  10-year probability of major osteoporotic fracture: 12%  10-year probability of hip fracture: 2.6% -Patient is not a candidate for pharmacologic treatment -Current treatment  Calcium 600 mg 1 tablet once daily (20 mcg of vitamin D in each) - Appropriate, Effective, Query Safe, Accessible Multivitamin (25 mcg of vitamin D) daily - Appropriate, Effective, Safe, Accessible -Medications previously tried: Prolia (only completed 4 years or less of treatment - BMD improved) -Recommend [phone] units of  vitamin D daily. Recommend 1200 mg of calcium daily from dietary and supplemental sources. Recommend weight-bearing and muscle strengthening exercises for building and maintaining bone density. -Counseled on diet and exercise extensively Recommended to continue current medication Collaborated with NP about rechecking calcium.  GERD/cough/nerve damage (Goal: minimize symptoms) -Controlled -Current treatment  Gabapentin 100 mg 1 capsule at bedtime - Appropriate, Effective, Safe, Accessible -Medications previously tried: omeprazole, nortriptyline, pantoprazole -Counseled on non-pharmacologic management of symptoms such as elevating the head of your bed, avoiding eating 2-3 hours before bed, avoiding triggering foods such as acidic, spicy, or fatty foods, eating smaller meals, and wearing clothes that are loose around the waist  Allergic rhinitis (Goal: minimize symptoms) -Controlled -Current treatment  Fluticasone 50 mcg/act 2 sprays as needed - Appropriate, Effective, Safe, Accessible -Medications previously tried: chlorpheniramine -Counseled on fall risks with taking older antihistamines such as chlorpheniramine and recommended Zyrtec or Allegra   Health Maintenance -Vaccine gaps: none -Current therapy:  Ibuprofen 200 mg as needed Multivitamin 1 tablet daily -Educated on Cost vs benefit of each product must be carefully weighed by individual consumer -Patient is satisfied with current therapy and denies issues -Recommended to continue current medication  Patient Goals/Self-Care Activities Patient will:  - take medications as prescribed check glucose a few times a week, document, and provide at future appointments target a minimum of 150 minutes of moderate intensity exercise weekly  Follow Up Plan: The care management team will reach out to the patient again over the next  30 days.         Medication Assistance: None required.  Patient affirms current coverage meets  needs.  Compliance/Adherence/Medication fill history: Care Gaps: COVID booster A1c: 6.1% (06/02/21) Last BP: 138/70 (10/28/20)   Star-Rating Drugs: Atorvastatin (Lipitor) 40 mg - Last filled 05/30/21 90 DS at [facility] Losartan (Cozaar) 25 mg - Last filled 05/31/21 90 DS at [facility] Empagliflozin- Metformin  (Synjardy) 5-500 mg - Last filled 04/25/21 90 DS at [facility]  Patient's preferred pharmacy is:  [facility], [place] - [address] [place] [address] Phone: [phone] Fax: [phone]   Uses pill box? Yes - pillbox and alarm reminders Pt endorses 100% compliance - sometimes misses the calcium or multivitamin  We discussed: Current pharmacy is preferred with insurance plan and patient is satisfied with pharmacy services Patient decided to: Continue current medication management strategy  Care Plan and Follow Up Patient Decision:  Patient agrees to Care Plan and Follow-up.  Plan: The care management team will reach out to the patient again over the next 14 days.  Jeni Salles, PharmD [facility] Clinical Pharmacist [place] at [place]

## 2021-07-20 ENCOUNTER — Ambulatory Visit: Payer: Medicare PPO | Admitting: Pharmacist

## 2021-07-20 DIAGNOSIS — E118 Type 2 diabetes mellitus with unspecified complications: Secondary | ICD-10-CM

## 2021-07-20 DIAGNOSIS — M818 Other osteoporosis without current pathological fracture: Secondary | ICD-10-CM

## 2021-07-20 NOTE — Patient Instructions (Addendum)
Hi Grettell,  It was great to catch up with you again! I am glad you are doing so well right now - keep up the good work!  Please reach out to me if you have any questions or need anything!  Best, Maddie  Jeni Salles, PharmD, [facility]   Visit Information   Goals Addressed   None    Patient Care Plan: CCM Pharmacy Care Plan     Problem Identified: Problem: Hyperlipidemia, Diabetes, GERD, Osteoporosis and Allergic Rhinitis      Long-Range Goal: Patient-Specific Goal   Start Date: 05/27/2020  Expected End Date: 05/27/2021  Recent Progress: On track  Priority: High  Note:   Current Barriers:  Unable to independently monitor therapeutic efficacy Unable to achieve control of diabetes   Pharmacist Clinical Goal(s):  Patient will achieve adherence to monitoring guidelines and medication adherence to achieve therapeutic efficacy achieve control of diabetes as evidenced by A1c  through collaboration with PharmD and provider.   Interventions: 1:1 collaboration with Caren Macadam, MD regarding development and update of comprehensive plan of care as evidenced by provider attestation and co-signature Inter-disciplinary care team collaboration (see longitudinal plan of care) Comprehensive medication review performed; medication list updated in electronic medical record  Hypertension (BP goal <130/80) -Controlled -Current treatment: Losartan 25 mg 1 tablet daily - Appropriate, Effective, Safe, Accessible -Medications previously tried: lisinopril (cough)  -Current home readings: does not check (needs to purchase a BP cuff) -Current dietary habits: limits red meat and eats lots of vegetables -Current exercise habits: some walking -Denies hypotensive/hypertensive symptoms -Educated on Exercise goal of 150 minutes per week; Importance of home blood pressure monitoring; Proper BP monitoring technique; -Counseled to  monitor BP at home weekly, document, and provide log at future appointments -Counseled on diet and exercise extensively Recommended to continue current medication Recommended purchasing a BP cuff.  Hyperlipidemia: (LDL goal < 70) -Controlled -Current treatment: Atorvastatin 40 mg 1 tablet daily - Appropriate, Effective, Safe, Accessible -Medications previously tried: none  -Current dietary patterns: limits take out and red meat -Current exercise habits: some walking -Educated on Cholesterol goals;  Benefits of statin for ASCVD risk reduction; Importance of limiting foods high in cholesterol; Exercise goal of 150 minutes per week; -Counseled on diet and exercise extensively Recommended to continue current medication  Diabetes (A1c goal <7%) -Controlled -Current medications: Synjardy 5-500 mg 1 tablet twice daily - Appropriate, Effective, Safe, Accessible -Medications previously tried: none  -Current home glucose readings fasting glucose: lowest was 89, low 110s, highest was 130 post prandial glucose: n/a -Denies hypoglycemic/hyperglycemic symptoms -Current meal patterns:  breakfast: scrambled eggs, yogurt, and blueberries Lunch/dinner: chicken taco with beans and a salad snacks: did not discuss drinks: did not discuss -Current exercise: some walking  -Educated on A1c and blood sugar goals; Exercise goal of 150 minutes per week; Benefits of routine self-monitoring of blood sugar; Carbohydrate counting and/or plate method -Counseled to check feet daily and get yearly eye exams -Counseled on diet and exercise extensively Recommended to continue current medication Educated on goals for blood sugars (morning 80-130, 1-2 hours after eating < 180 and before bedtime 100-150).   Osteoporosis (Goal prevent fractures) -Controlled -Last DEXA Scan: 11/2020  T-Score femoral neck: -2.2  T-Score total hip: n/a  T-Score lumbar spine: 0.7  T-Score forearm radius: n/a  10-year  probability of major osteoporotic fracture: 12%  10-year probability of hip fracture: 2.6% -Patient is not a candidate for pharmacologic treatment -Current treatment  Calcium 600 mg 1 tablet once daily (20 mcg of vitamin D in each) - Appropriate, Effective, Query Safe, Accessible Multivitamin (25 mcg of vitamin D) daily - Appropriate, Effective, Safe, Accessible -Medications previously tried: Prolia (only completed 4 years or less of treatment - BMD improved) -Recommend [phone] units of vitamin D daily. Recommend 1200 mg of calcium daily from dietary and supplemental sources. Recommend weight-bearing and muscle strengthening exercises for building and maintaining bone density. -Counseled on diet and exercise extensively Recommended to continue current medication Collaborated with NP about rechecking calcium.  GERD/cough/nerve damage (Goal: minimize symptoms) -Controlled -Current treatment  Gabapentin 100 mg 1 capsule at bedtime - Appropriate, Effective, Safe, Accessible -Medications previously tried: omeprazole, nortriptyline, pantoprazole -Counseled on non-pharmacologic management of symptoms such as elevating the head of your bed, avoiding eating 2-3 hours before bed, avoiding triggering foods such as acidic, spicy, or fatty foods, eating smaller meals, and wearing clothes that are loose around the waist  Allergic rhinitis (Goal: minimize symptoms) -Controlled -Current treatment  Fluticasone 50 mcg/act 2 sprays as needed - Appropriate, Effective, Safe, Accessible -Medications previously tried: chlorpheniramine -Counseled on fall risks with taking older antihistamines such as chlorpheniramine and recommended Zyrtec or Allegra   Health Maintenance -Vaccine gaps: none -Current therapy:  Ibuprofen 200 mg as needed Multivitamin 1 tablet daily -Educated on Cost vs benefit of each product must be carefully weighed by individual consumer -Patient is satisfied with current therapy and  denies issues -Recommended to continue current medication  Patient Goals/Self-Care Activities Patient will:  - take medications as prescribed check glucose a few times a week, document, and provide at future appointments target a minimum of 150 minutes of moderate intensity exercise weekly  Follow Up Plan: The care management team will reach out to the patient again over the next 30 days.         Patient verbalizes understanding of instructions and care plan provided today and agrees to view in [place]. Active MyChart status and patient understanding of how to access instructions and care plan via MyChart confirmed with patient.    The pharmacy team will reach out to the patient again over the next 7 days.   Viona Gilmore, [facility]

## 2021-07-22 ENCOUNTER — Ambulatory Visit (HOSPITAL_COMMUNITY)
Admission: RE | Admit: 2021-07-22 | Discharge: 2021-07-22 | Disposition: A | Discharge status: Home / Self Care | Payer: Medicare PPO | Source: Ambulatory Visit | Attending: Vascular Surgery | Admitting: Vascular Surgery

## 2021-07-22 ENCOUNTER — Ambulatory Visit: Payer: Medicare PPO | Admitting: Physician Assistant

## 2021-07-22 VITALS — BP 127/83 | HR 68 | Temp 95.0°F | Resp 20 | Ht 61.0 in | Wt 118.7 lb

## 2021-07-22 DIAGNOSIS — I8393 Asymptomatic varicose veins of bilateral lower extremities: Secondary | ICD-10-CM | POA: Diagnosis not present

## 2021-08-30 ENCOUNTER — Telehealth: Payer: Self-pay | Admitting: Pharmacist

## 2021-08-30 ENCOUNTER — Other Ambulatory Visit: Payer: Self-pay | Admitting: Family Medicine

## 2021-08-30 DIAGNOSIS — E1169 Type 2 diabetes mellitus with other specified complication: Secondary | ICD-10-CM

## 2021-08-30 MED ORDER — ATORVASTATIN CALCIUM 40 MG PO TABS: 40.0000 mg | ORAL_TABLET | Freq: Every day | ORAL | 1 refills | Status: DC

## 2021-08-30 NOTE — Telephone Encounter (Signed)
Patient called me as she is out of her atorvastatin and was told by the pharmacy that a refill request was sent over for it. Unable to find the refill request but will route to new PCP for refills.

## 2021-09-07 ENCOUNTER — Other Ambulatory Visit: Payer: Self-pay | Admitting: *Deleted

## 2021-09-07 DIAGNOSIS — E118 Type 2 diabetes mellitus with unspecified complications: Secondary | ICD-10-CM

## 2021-09-07 MED ORDER — LOSARTAN POTASSIUM 25 MG PO TABS: ORAL_TABLET | ORAL | 0 refills | Status: DC

## 2021-10-25 ENCOUNTER — Other Ambulatory Visit: Payer: Self-pay | Admitting: *Deleted

## 2021-10-25 MED ORDER — SYNJARDY 5-500 MG PO TABS: 1.0000 | ORAL_TABLET | Freq: Two times a day (BID) | ORAL | 0 refills | Status: DC

## 2021-12-01 DIAGNOSIS — Z1231 Encounter for screening mammogram for malignant neoplasm of breast: Secondary | ICD-10-CM | POA: Diagnosis not present

## 2021-12-04 ENCOUNTER — Other Ambulatory Visit: Payer: Self-pay | Admitting: Family Medicine

## 2021-12-04 DIAGNOSIS — E118 Type 2 diabetes mellitus with unspecified complications: Secondary | ICD-10-CM

## 2021-12-06 ENCOUNTER — Encounter: Payer: Self-pay | Admitting: Family Medicine

## 2021-12-06 ENCOUNTER — Ambulatory Visit: Payer: Medicare PPO | Admitting: Family Medicine

## 2021-12-06 VITALS — BP 120/68 | HR 91 | Temp 98.2°F | Ht 61.0 in | Wt 120.0 lb

## 2021-12-06 DIAGNOSIS — E118 Type 2 diabetes mellitus with unspecified complications: Secondary | ICD-10-CM | POA: Diagnosis not present

## 2021-12-06 DIAGNOSIS — I10 Essential (primary) hypertension: Secondary | ICD-10-CM | POA: Diagnosis not present

## 2021-12-06 DIAGNOSIS — M19049 Primary osteoarthritis, unspecified hand: Secondary | ICD-10-CM | POA: Diagnosis not present

## 2021-12-06 LAB — POCT GLYCOSYLATED HEMOGLOBIN (HGB A1C): Hemoglobin A1C: 6.1 % — AB (ref 4.0–5.6)

## 2021-12-06 MED ORDER — LOSARTAN POTASSIUM 25 MG PO TABS: ORAL_TABLET | ORAL | 3 refills | Status: DC

## 2021-12-06 NOTE — Progress Notes (Signed)
Established Patient Office Visit  Subjective   Patient ID: Stephanie Grimes, female    DOB: 01-Apr-1950  Age: 71 y.o. MRN: [id number]  Chief Complaint  Patient presents with   Establish Care    Patient is here for transition of care visit.   Diabetes-- patient reports she is doing well with her blood sugars. She reports that she is compliant with er medication, state she checks her blood sugars every other day. A1C today performed in office and is..   We reviewed her HM and she is UTD on her foot, eye, and kidney evaluations. She is UTD on her vaccinations.   Chronic cough- pt reports she has had this for decades. States that it is a combination of GERD, allergies and vocal fold atrophy/ dysphonia. States that the medications including gabapentin 100 mg BID is working well for her.   Hand OA-- pt reports that she has decreased ROM of her fingers, on both hands but worse on the right which is her dominant hand. She reports that her fingers "sometimes get stuck" but not constant. We discussed referral to hand surgeon, pt wants to wait.   Current Outpatient Medications  Medication Instructions   atorvastatin (LIPITOR) 40 mg, Oral, Daily   azelastine (ASTELIN) 0.1 % nasal spray Nasal   blood glucose meter kit and supplies KIT Dispense based on patient and insurance preference. Use up to four times daily as directed.   Calcium Carbonate-Vit D-Min (CALCIUM 1200 PO) Oral, Daily   chlorpheniramine (CHLOR-TRIMETON) 4 MG tablet Oral   Cholecalciferol 125 MCG (5000 UT) TABS Oral   docusate sodium (COLACE) 100 mg, Oral, 2 times daily   Empagliflozin-metFORMIN HCl (SYNJARDY) 5-500 MG TABS 1 tablet, Oral, 2 times daily   fluticasone (FLONASE) 50 MCG/ACT nasal spray 2 sprays, Nasal, Daily   gabapentin (NEURONTIN) 100 mg, Oral, 3 times daily PRN, Take 1 tablet morning, 2 every night   ibuprofen (ADVIL) 200 mg, Oral, As needed   losartan (COZAAR) 25 MG tablet TAKE 1 TABLET(25 MG) BY MOUTH DAILY    Multiple Vitamin (MULTIVITAMIN) capsule 1 capsule, Oral, Daily,     OVER THE COUNTER MEDICATION OTC medication for reflux once a day-cannot recall name    Patient Active Problem List   Diagnosis Date Noted   Hypertension 12/06/2021   Localized osteoarthritis of hand 12/06/2021   Hyperlipidemia associated with type 2 diabetes mellitus ([place]) 12/04/2019   Post-nasal drainage 11/22/2017   Vertigo 11/22/2017   Osteoporosis 08/24/2016   Type II diabetes mellitus with complication ([place]) 10/16/1217   History of colonic polyps    Esophageal reflux 09/24/2010   Globus sensation 09/24/2010   Raynauds phenomenon 09/24/2010      Review of Systems  All other systems reviewed and are negative.     Objective:     BP 120/68 (BP Location: Left Arm, Patient Position: Sitting, Cuff Size: Normal)   Pulse 91   Temp 98.2 F (36.8 C) (Oral)   Ht _0  (1.549 m)   Wt 120 lb (54.4 kg)   LMP 01/10/1998   SpO2 97%   BMI 22.67 kg/m    Physical Exam Vitals reviewed.  Constitutional:      Appearance: Normal appearance. She is well-groomed and normal weight.  Eyes:     Conjunctiva/sclera: Conjunctivae normal.  Neck:     Thyroid: No thyromegaly.  Cardiovascular:     Rate and Rhythm: Normal rate and regular rhythm.     Pulses: Normal pulses.     Heart  sounds: S1 normal and S2 normal.  Pulmonary:     Effort: Pulmonary effort is normal.     Breath sounds: Normal breath sounds and air entry.  Abdominal:     General: Bowel sounds are normal.  Musculoskeletal:     Right lower leg: No edema.     Left lower leg: No edema.  Neurological:     Mental Status: She is alert and oriented to person, place, and time. Mental status is at baseline.     Gait: Gait is intact.  Psychiatric:        Mood and Affect: Mood and affect normal.        Speech: Speech normal.        Behavior: Behavior normal.        Judgment: Judgment normal.      Results for orders placed or performed in visit on 12/06/21   POC HgB A1c  Result Value Ref Range   Hemoglobin A1C 6.1 (A) 4.0 - 5.6 %   HbA1c POC (<> result, manual entry)     HbA1c, POC (prediabetic range)     HbA1c, POC (controlled diabetic range)        The 10-year ASCVD risk score (Arnett DK, et al., 2019) is: 20.2%    Assessment & Plan:   Problem List Items Addressed This Visit       Unprioritized   Type II diabetes mellitus with complication ([place]) - Primary    A1C remains at 6.1 today, well controlled, continue synjardy 5/500 1 tab BID for diabetes management. She is UTD on her health maintenance. Will see her back in 6 months for annual labs and foot exam.       Relevant Medications   losartan (COZAAR) 25 MG tablet   Other Relevant Orders   POC HgB A1c (Completed)   Hypertension    BP is well controlled today in office, will continue losartan 25 mg daily.       Relevant Medications   losartan (COZAAR) 25 MG tablet   Localized osteoarthritis of hand (Chronic)    Both hands involved, worse on the right hand. Pt is having mild contractures of the third, fourth and fifth digit on this hand, I advised that if she begins to lose function then she will need referral to hand surgeon for management.        Return in about 6 months (around 06/06/2022) for annual visit.    Farrel Conners, MD

## 2021-12-06 NOTE — Assessment & Plan Note (Signed)
Both hands involved, worse on the right hand. Pt is having mild contractures of the third, fourth and fifth digit on this hand, I advised that if she begins to lose function then she will need referral to hand surgeon for management.

## 2021-12-06 NOTE — Assessment & Plan Note (Signed)
BP is well controlled today in office, will continue losartan 25 mg daily.

## 2021-12-06 NOTE — Assessment & Plan Note (Signed)
A1C remains at 6.1 today, well controlled, continue synjardy 5/500 1 tab BID for diabetes management. She is UTD on her health maintenance. Will see her back in 6 months for annual labs and foot exam.

## 2021-12-08 ENCOUNTER — Encounter: Payer: Self-pay | Admitting: Obstetrics and Gynecology

## 2022-01-24 ENCOUNTER — Other Ambulatory Visit: Payer: Self-pay | Admitting: Family Medicine

## 2022-02-17 ENCOUNTER — Ambulatory Visit: Payer: Medicare PPO | Admitting: Family Medicine

## 2022-02-17 ENCOUNTER — Encounter: Payer: Self-pay | Admitting: Family Medicine

## 2022-02-17 VITALS — BP 110/64 | HR 110 | Temp 97.7°F | Ht 61.0 in | Wt 118.0 lb

## 2022-02-17 DIAGNOSIS — E118 Type 2 diabetes mellitus with unspecified complications: Secondary | ICD-10-CM | POA: Diagnosis not present

## 2022-02-17 NOTE — Progress Notes (Signed)
Established Patient Office Visit  Subjective   Patient ID: Stephanie Grimes, female    DOB: [birth year]  Age: 72 y.o. MRN: [id number]  Chief Complaint  Patient presents with   Hyperglycemia    Patient complains of elevated glucose readings x3 weeks, denies any diet changes    Patient is reporting spiking blood sugars for the past 3 weeks, states that she has getting 157, 152, states that she hasn't changed anything about her diet, is still watching her sugar intake. States that last month she refilled her synjardy states that with this new fill is when it started going higher, states that the pills look the same, doesn't think that it is a Risk analyst. States that she has not had any blood sugars over 200. States that it has been colder so she hasn't been out walking as much.   Current Outpatient Medications  Medication Instructions   atorvastatin (LIPITOR) 40 mg, Oral, Daily   azelastine (ASTELIN) 0.1 % nasal spray Nasal   blood glucose meter kit and supplies KIT Dispense based on patient and insurance preference. Use up to four times daily as directed.   Calcium Carbonate-Vit D-Min (CALCIUM 1200 PO) Oral, Daily   chlorpheniramine (CHLOR-TRIMETON) 4 MG tablet Oral   Cholecalciferol 125 MCG (5000 UT) TABS Oral   docusate sodium (COLACE) 100 mg, Oral, 2 times daily   Empagliflozin-metFORMIN HCl (SYNJARDY) 5-500 MG TABS 1 tablet, Oral, 2 times daily   fluticasone (FLONASE) 50 MCG/ACT nasal spray 2 sprays, Nasal, Daily   gabapentin (NEURONTIN) 100 mg, Oral, 3 times daily PRN, Take 1 tablet morning, 2 every night   ibuprofen (ADVIL) 200 mg, Oral, As needed   losartan (COZAAR) 25 MG tablet TAKE 1 TABLET(25 MG) BY MOUTH DAILY   Multiple Vitamin (MULTIVITAMIN) capsule 1 capsule, Oral, Daily,     OVER THE COUNTER MEDICATION OTC medication for reflux once a day-cannot recall name    Patient Active Problem List   Diagnosis Date Noted   Hypertension 12/06/2021   Localized osteoarthritis  of hand 12/06/2021   Hyperlipidemia associated with type 2 diabetes mellitus ([place]) 12/04/2019   Post-nasal drainage 11/22/2017   Vertigo 11/22/2017   Osteoporosis 08/24/2016   Type II diabetes mellitus with complication ([place]) 54/00/8676   History of colonic polyps    Esophageal reflux 09/24/2010   Globus sensation 09/24/2010   Raynauds phenomenon 09/24/2010      Review of Systems  All other systems reviewed and are negative.     Objective:     BP 110/64 (BP Location: Left Arm, Patient Position: Sitting, Cuff Size: Normal)   Pulse (!) 110   Temp 97.7 F (36.5 C) (Oral)   Ht '5\' 1"'$  (1.549 m)   Wt 118 lb (53.5 kg)   LMP 01/10/1998   SpO2 98%   BMI 22.30 kg/m    Physical Exam Vitals reviewed.  Constitutional:      Appearance: Normal appearance. She is well-groomed and normal weight.  Eyes:     Conjunctiva/sclera: Conjunctivae normal.  Cardiovascular:     Heart sounds: S1 normal and S2 normal.  Pulmonary:     Effort: Pulmonary effort is normal.  Neurological:     Mental Status: She is alert and oriented to person, place, and time. Mental status is at baseline.     Gait: Gait is intact.  Psychiatric:        Mood and Affect: Mood and affect normal.        Speech: Speech normal.  Behavior: Behavior normal.      No results found for any visits on 02/17/22.    The 10-year ASCVD risk score (Arnett DK, et al., 2019) is: 17.3%    Assessment & Plan:   Problem List Items Addressed This Visit       Unprioritized   Type II diabetes mellitus with complication ([place]) - Primary  I gave the patient samples of the Freestyle libre 3 CGM for her to wear for 4 weeks. Then she will return and we can review her blood sugars to try and find a pattern.   Return in about 6 weeks (around 03/31/2022) for DM-- please move up her May visit to [place].    Farrel Conners, MD

## 2022-02-22 ENCOUNTER — Other Ambulatory Visit: Payer: Self-pay | Admitting: Family Medicine

## 2022-02-22 DIAGNOSIS — E1169 Type 2 diabetes mellitus with other specified complication: Secondary | ICD-10-CM

## 2022-03-01 ENCOUNTER — Encounter: Payer: Self-pay | Admitting: Family Medicine

## 2022-03-31 ENCOUNTER — Ambulatory Visit: Payer: Medicare PPO | Admitting: Family Medicine

## 2022-03-31 ENCOUNTER — Encounter: Payer: Self-pay | Admitting: Family Medicine

## 2022-03-31 VITALS — BP 118/72 | HR 90 | Temp 98.2°F | Ht 61.0 in | Wt 118.7 lb

## 2022-03-31 DIAGNOSIS — E118 Type 2 diabetes mellitus with unspecified complications: Secondary | ICD-10-CM | POA: Diagnosis not present

## 2022-03-31 LAB — POCT GLYCOSYLATED HEMOGLOBIN (HGB A1C): Hemoglobin A1C: 6.3 % — AB (ref 4.0–5.6)

## 2022-03-31 NOTE — Progress Notes (Signed)
Established Patient Office Visit  Subjective   Patient ID: Stephanie Grimes, female    DOB: 22-Sep-1950  Age: 72 y.o. MRN: [id number]  Chief Complaint  Patient presents with   Medical Management of Chronic Issues    Pt is here for follow up on her blood sugars. States that she used the CGM for about 1 month. States that she watched her blood sugars closely and it never went above 200. She reports that she feels a lot better now and she feels more reassured about her blood sugars. She remains on her synjardy 5/500 mg daily. States that she only had 1 alarm for low blood sugar.    Current Outpatient Medications  Medication Instructions   atorvastatin (LIPITOR) 40 MG tablet TAKE 1 TABLET(40 MG) BY MOUTH DAILY   azelastine (ASTELIN) 0.1 % nasal spray Nasal   blood glucose meter kit and supplies KIT Dispense based on patient and insurance preference. Use up to four times daily as directed.   Calcium Carbonate-Vit D-Min (CALCIUM 1200 PO) Oral, Daily   chlorpheniramine (CHLOR-TRIMETON) 4 MG tablet Oral   Cholecalciferol 125 MCG (5000 UT) TABS Oral   docusate sodium (COLACE) 100 mg, Oral, 2 times daily   Empagliflozin-metFORMIN HCl (SYNJARDY) 5-500 MG TABS 1 tablet, Oral, 2 times daily   fluticasone (FLONASE) 50 MCG/ACT nasal spray 2 sprays, Nasal, Daily   gabapentin (NEURONTIN) 100 mg, Oral, 3 times daily PRN, Take 1 tablet morning, 2 every night   ibuprofen (ADVIL) 200 mg, Oral, As needed   losartan (COZAAR) 25 MG tablet TAKE 1 TABLET(25 MG) BY MOUTH DAILY   Multiple Vitamin (MULTIVITAMIN) capsule 1 capsule, Oral, Daily,     OVER THE COUNTER MEDICATION OTC medication for reflux once a day-cannot recall name    Patient Active Problem List   Diagnosis Date Noted   Hypertension 12/06/2021   Localized osteoarthritis of hand 12/06/2021   Hyperlipidemia associated with type 2 diabetes mellitus ([place]) 12/04/2019   Post-nasal drainage 11/22/2017   Vertigo 11/22/2017   Osteoporosis 08/24/2016   Type  II diabetes mellitus with complication ([place]) [id number]   History of colonic polyps    Esophageal reflux 09/24/2010   Globus sensation 09/24/2010   Raynauds phenomenon 09/24/2010      Review of Systems  All other systems reviewed and are negative.     Objective:     BP 118/72 (BP Location: Left Arm, Patient Position: Sitting, Cuff Size: Normal)   Pulse 90   Temp 98.2 F (36.8 C) (Oral)   Ht 5\' 1"  (1.549 m)   Wt 118 lb 11.2 oz (53.8 kg)   LMP 01/10/1998   SpO2 98%   BMI 22.43 kg/m    Physical Exam Vitals reviewed.  Constitutional:      Appearance: Normal appearance. She is well-groomed and normal weight.  Cardiovascular:     Rate and Rhythm: Normal rate and regular rhythm.     Pulses: Normal pulses.     Heart sounds: S1 normal and S2 normal.  Pulmonary:     Effort: Pulmonary effort is normal.     Breath sounds: Normal breath sounds and air entry.  Abdominal:     General: Bowel sounds are normal.  Neurological:     Mental Status: She is alert and oriented to person, place, and time. Mental status is at baseline.     Gait: Gait is intact.  Psychiatric:        Mood and Affect: Mood and affect normal.  Speech: Speech normal.        Behavior: Behavior normal.        Judgment: Judgment normal.      Results for orders placed or performed in visit on 03/31/22  POC HgB A1c  Result Value Ref Range   Hemoglobin A1C 6.3 (A) 4.0 - 5.6 %   HbA1c POC (<> result, manual entry)     HbA1c, POC (prediabetic range)     HbA1c, POC (controlled diabetic range)        The 10-year ASCVD risk score (Arnett DK, et al., 2019) is: 19.6%    Assessment & Plan:   Problem List Items Addressed This Visit       Unprioritized   Type II diabetes mellitus with complication ([place]) - Primary    I reviewed her blood sugars from the last month, she is very well controlled, nothing over 200, I reassured patient, A1CV today is 6.3 which is still good. RTC in May at her regular appt.  Ordered her labs for the next visit.       Relevant Orders   POC HgB A1c (Completed)   Lipid Panel   Microalbumin/Creatinine Ratio, Urine   CMP    No follow-ups on file.    Farrel Conners, MD

## 2022-04-04 NOTE — Assessment & Plan Note (Addendum)
I reviewed her blood sugars from the last month, she is very well controlled, nothing over 200, I reassured patient, A1CV today is 6.3 which is still good. RTC in May at her regular appt. Ordered her labs for the next visit.

## 2022-05-06 ENCOUNTER — Telehealth: Payer: Self-pay | Admitting: Family Medicine

## 2022-05-06 NOTE — Telephone Encounter (Signed)
Called patient to schedule Medicare Annual Wellness Visit (AWV). Left message for patient to call back and schedule Medicare Annual Wellness Visit (AWV).  Last date of AWV: 05/11/21  Please schedule an appointment at any time with [facility] or hannah kim.  If any questions, please contact me at [phone].  Thank you ,  Matylda Fehring [facility] AWV direct phone # [phone] 

## 2022-05-09 DIAGNOSIS — H5069 Other mechanical strabismus: Secondary | ICD-10-CM | POA: Diagnosis not present

## 2022-05-09 DIAGNOSIS — E119 Type 2 diabetes mellitus without complications: Secondary | ICD-10-CM | POA: Diagnosis not present

## 2022-05-09 DIAGNOSIS — H2513 Age-related nuclear cataract, bilateral: Secondary | ICD-10-CM | POA: Diagnosis not present

## 2022-05-09 LAB — HM DIABETES EYE EXAM

## 2022-06-03 ENCOUNTER — Other Ambulatory Visit (INDEPENDENT_AMBULATORY_CARE_PROVIDER_SITE_OTHER): Payer: Medicare PPO

## 2022-06-03 DIAGNOSIS — E118 Type 2 diabetes mellitus with unspecified complications: Secondary | ICD-10-CM | POA: Diagnosis not present

## 2022-06-03 LAB — LIPID PANEL
Cholesterol: 122 mg/dL (ref 0–200)
HDL: 54.2 mg/dL (ref >39.00)
LDL Cholesterol: 51 mg/dL (ref 0–99)
NonHDL: 67.54
Total CHOL/HDL Ratio: 2
Triglycerides: 82 mg/dL (ref 0.0–149.0)
VLDL: 16.4 mg/dL (ref 0.0–40.0)

## 2022-06-03 LAB — MICROALBUMIN / CREATININE URINE RATIO
Creatinine,U: 44.8 mg/dL
Microalb Creat Ratio: 1.6 mg/g (ref 0.0–30.0)
Microalb, Ur: <0.7 mg/dL (ref 0.0–1.9)

## 2022-06-03 LAB — COMPREHENSIVE METABOLIC PANEL
ALT: 16 U/L (ref 0–35)
AST: 16 U/L (ref 0–37)
Albumin: 4.1 g/dL (ref 3.5–5.2)
Alkaline Phosphatase: 53 U/L (ref 39–117)
BUN: 22 mg/dL (ref 6–23)
CO2: 30 mEq/L (ref 19–32)
Calcium: 9.6 mg/dL (ref 8.4–10.5)
Chloride: 105 mEq/L (ref 96–112)
Creatinine, Ser: 0.66 mg/dL (ref 0.40–1.20)
GFR: 88.01 mL/min (ref >60.00)
Glucose, Bld: 116 mg/dL — ABNORMAL HIGH (ref 70–99)
Potassium: 4.7 mEq/L (ref 3.5–5.1)
Sodium: 142 mEq/L (ref 135–145)
Total Bilirubin: 0.5 mg/dL (ref 0.2–1.2)
Total Protein: 6.6 g/dL (ref 6.0–8.3)

## 2022-06-07 ENCOUNTER — Encounter: Payer: Medicare PPO | Admitting: Family Medicine

## 2022-06-10 ENCOUNTER — Ambulatory Visit (INDEPENDENT_AMBULATORY_CARE_PROVIDER_SITE_OTHER): Payer: Medicare PPO | Admitting: Family Medicine

## 2022-06-10 ENCOUNTER — Encounter: Payer: Self-pay | Admitting: Family Medicine

## 2022-06-10 VITALS — BP 120/62 | HR 85 | Temp 97.7°F | Ht 60.75 in | Wt 121.1 lb

## 2022-06-10 DIAGNOSIS — R09A2 Foreign body sensation, throat: Secondary | ICD-10-CM | POA: Diagnosis not present

## 2022-06-10 DIAGNOSIS — K219 Gastro-esophageal reflux disease without esophagitis: Secondary | ICD-10-CM

## 2022-06-10 DIAGNOSIS — Z0000 Encounter for general adult medical examination without abnormal findings: Secondary | ICD-10-CM

## 2022-06-10 NOTE — Patient Instructions (Signed)

## 2022-06-10 NOTE — Progress Notes (Signed)
Complete physical exam  Patient: Stephanie Grimes   DOB: [birth year]   72 y.o. Female  MRN: [id number]  Subjective:    Chief Complaint  Patient presents with   Annual Exam   Leg Pain    Patient complains of nocturnal bilateral leg cramps for years, worse past month, questioned if related to statin    Stephanie Grimes is a 72 y.o. female who presents today for a complete physical exam. She reports consuming a general diet. Home exercise routine includes walking 2 hrs per week. She generally feels well. She reports sleeping well. She does not have additional problems to discuss today.    Most recent fall risk assessment:    06/10/2022    9:02 AM  Fall Risk   Falls in the past year? 0  Number falls in past yr: 0  Injury with Fall? 0  Risk for fall due to : No Fall Risks  Follow up Falls evaluation completed     Most recent depression screenings:    06/10/2022    9:02 AM 03/31/2022    1:23 PM  PHQ 2/9 Scores  PHQ - 2 Score 0 0  PHQ- 9 Score 2 2    Vision:Within last year and Dental: No current dental problems and Receives regular dental care  Patient Active Problem List   Diagnosis Date Noted   Hypertension 12/06/2021   Localized osteoarthritis of hand 12/06/2021   Hyperlipidemia associated with type 2 diabetes mellitus (HCC) 12/04/2019   Post-nasal drainage 11/22/2017   Vertigo 11/22/2017   Osteoporosis 08/24/2016   Type II diabetes mellitus with complication (HCC) 05/24/2016   History of colonic polyps    Esophageal reflux 09/24/2010   Globus sensation 09/24/2010   Raynauds phenomenon 09/24/2010      Patient Care Team: Karie Georges, MD as PCP - General (Family Medicine) Patton Salles, MD as Consulting Physician (Obstetrics and Gynecology) Rhea Belton, Carie Caddy, MD as Consulting Physician (Gastroenterology) Verner Chol, [facility] (Inactive) as Pharmacist (Pharmacist)   Outpatient Medications Prior to Visit  Medication Sig   atorvastatin (LIPITOR) 40 MG tablet  TAKE 1 TABLET(40 MG) BY MOUTH DAILY   azelastine (ASTELIN) 0.1 % nasal spray Place into the nose.   blood glucose meter kit and supplies KIT Dispense based on patient and insurance preference. Use up to four times daily as directed.   Calcium Carbonate-Vit D-Min (CALCIUM 1200 PO) Take by mouth daily.   chlorpheniramine (CHLOR-TRIMETON) 4 MG tablet Take by mouth.   Cholecalciferol 125 MCG (5000 UT) TABS Take by mouth.   docusate sodium (COLACE) 100 MG capsule Take 100 mg by mouth 2 (two) times daily.   Empagliflozin-metFORMIN HCl (SYNJARDY) 5-500 MG TABS TAKE 1 TABLET BY MOUTH TWICE DAILY   fluticasone (FLONASE) 50 MCG/ACT nasal spray Place 2 sprays into the nose daily. (Patient taking differently: Place 2 sprays into the nose as needed.)   gabapentin (NEURONTIN) 100 MG capsule Take 1 capsule (100 mg total) by mouth 3 (three) times daily as needed. Take 1 tablet morning, 2 every night (Patient taking differently: Take 100 mg by mouth at bedtime.)   ibuprofen (ADVIL,MOTRIN) 200 MG tablet Take 200 mg by mouth as needed for mild pain.    losartan (COZAAR) 25 MG tablet TAKE 1 TABLET(25 MG) BY MOUTH DAILY   Multiple Vitamin (MULTIVITAMIN) capsule Take 1 capsule by mouth daily.   OVER THE COUNTER MEDICATION OTC medication for reflux once a day-cannot recall name   No facility-administered medications prior  to visit.    Review of Systems  HENT:  Negative for hearing loss.   Eyes:  Negative for blurred vision.  Respiratory:  Negative for shortness of breath.   Cardiovascular:  Negative for chest pain.  Gastrointestinal: Negative.   Genitourinary: Negative.   Musculoskeletal:  Negative for back pain.  Neurological:  Negative for headaches.  Psychiatric/Behavioral:  Negative for depression.   All other systems reviewed and are negative.      Objective:     BP 120/62 (BP Location: Left Arm, Patient Position: Sitting, Cuff Size: Normal)   Pulse 85   Temp 97.7 F (36.5 C) (Oral)   Ht 5'  0.75" (1.543 m)   Wt 121 lb 1.6 oz (54.9 kg)   LMP 01/10/1998   SpO2 98%   BMI 23.07 kg/m    Physical Exam Vitals reviewed.  Constitutional:      Appearance: Normal appearance. She is well-groomed and normal weight.  Eyes:     Conjunctiva/sclera: Conjunctivae normal.  Neck:     Thyroid: No thyromegaly.  Cardiovascular:     Rate and Rhythm: Normal rate and regular rhythm.     Pulses: Normal pulses.     Heart sounds: S1 normal and S2 normal.  Pulmonary:     Effort: Pulmonary effort is normal.     Breath sounds: Normal breath sounds and air entry.  Abdominal:     General: Bowel sounds are normal.  Musculoskeletal:     Right lower leg: No edema.     Left lower leg: No edema.  Neurological:     Mental Status: She is alert and oriented to person, place, and time. Mental status is at baseline.     Gait: Gait is intact.  Psychiatric:        Mood and Affect: Mood and affect normal.        Speech: Speech normal.        Behavior: Behavior normal.        Judgment: Judgment normal.      Results for orders placed or performed in visit on 06/10/22  HM DIABETES EYE EXAM  Result Value Ref Range   HM Diabetic Eye Exam No Retinopathy No Retinopathy   Last metabolic panel Lab Results  Component Value Date   GLUCOSE 116 (H) 06/03/2022   NA 142 06/03/2022   K 4.7 06/03/2022   CL 105 06/03/2022   CO2 30 06/03/2022   BUN 22 06/03/2022   CREATININE 0.66 06/03/2022   GFRNONAA 78 12/09/2019   CALCIUM 9.6 06/03/2022   PROT 6.6 06/03/2022   ALBUMIN 4.1 06/03/2022   BILITOT 0.5 06/03/2022   ALKPHOS 53 06/03/2022   AST 16 06/03/2022   ALT 16 06/03/2022   ANIONGAP 11 07/05/2018   Last lipids Lab Results  Component Value Date   CHOL 122 06/03/2022   HDL 54.20 06/03/2022   LDLCALC 51 06/03/2022   LDLDIRECT 146.0 05/06/2016   TRIG 82.0 06/03/2022   CHOLHDL 2 06/03/2022   Last hemoglobin A1c Lab Results  Component Value Date   HGBA1C 6.3 (A) 03/31/2022        Assessment &  Plan:    Routine Health Maintenance and Physical Exam  Immunization History  Administered Date(s) Administered   Influenza, High Dose Seasonal PF 11/24/2016, 11/22/2017, 11/07/2018   Influenza-Unspecified 11/04/2018, 10/18/2019, 11/05/2021   PFIZER(Purple Top)SARS-COV-2 Vaccination 02/04/2019, 02/25/2019, 10/18/2019   PNEUMOCOCCAL CONJUGATE-20 06/02/2021   Pfizer Covid-19 Vaccine Bivalent Booster 10yrs & up 10/30/2020, 11/05/2021   Pneumococcal Conjugate-13 11/22/2017  Tdap 06/07/2013   Zoster Recombinat (Shingrix) 07/09/2019, 09/27/2019    Health Maintenance  Topic Date Due   Medicare Annual Wellness (AWV)  05/12/2022   Colonoscopy  09/22/2022   COVID-19 Vaccine (6 - 2023-24 season) 06/26/2022 (Originally 12/31/2021)   INFLUENZA VACCINE  08/11/2022   HEMOGLOBIN A1C  10/01/2022   MAMMOGRAM  12/02/2022   OPHTHALMOLOGY EXAM  05/09/2023   Diabetic kidney evaluation - eGFR measurement  06/03/2023   Diabetic kidney evaluation - Urine ACR  06/03/2023   DTaP/Tdap/Td (2 - Td or Tdap) 06/08/2023   FOOT EXAM  06/10/2023   Pneumonia Vaccine 25+ Years old  Completed   DEXA SCAN  Completed   Hepatitis C Screening  Completed   Zoster Vaccines- Shingrix  Completed   HPV VACCINES  Aged Out    Discussed health benefits of physical activity, and encouraged her to engage in regular exercise appropriate for her age and condition.  Gastroesophageal reflux disease without esophagitis -     Ambulatory referral to Gastroenterology  Globus sensation -     Ambulatory referral to Gastroenterology  Routine general medical examination at a health care facility  Normal physical exam findings, she is UTD on her health maintenance, she will schedule her AWV soon. She will be coming up due for her colonoscopy in Sept, I have placed a referral to GI-- pt states her ENT told her she needed an upper endoscopy as well. RTC 6 months for follow up. Handouts given on healthy exercise and eating.   Return in  6 months (on 12/10/2022) for pt needs AWV with [facility]. then t see me in 6 months for follow up DM.     Karie Georges, MD

## 2022-07-15 ENCOUNTER — Encounter: Payer: Self-pay | Admitting: Physician Assistant

## 2022-07-28 ENCOUNTER — Ambulatory Visit: Payer: Medicare PPO | Admitting: Physician Assistant

## 2022-07-28 ENCOUNTER — Encounter: Payer: Self-pay | Admitting: Physician Assistant

## 2022-07-28 VITALS — BP 110/70 | HR 112 | Ht 60.75 in | Wt 117.4 lb

## 2022-07-28 DIAGNOSIS — K5904 Chronic idiopathic constipation: Secondary | ICD-10-CM | POA: Diagnosis not present

## 2022-07-28 DIAGNOSIS — Z86010 Personal history of colonic polyps: Secondary | ICD-10-CM

## 2022-07-28 DIAGNOSIS — R1314 Dysphagia, pharyngoesophageal phase: Secondary | ICD-10-CM | POA: Diagnosis not present

## 2022-07-28 DIAGNOSIS — K219 Gastro-esophageal reflux disease without esophagitis: Secondary | ICD-10-CM

## 2022-07-28 DIAGNOSIS — R1319 Other dysphagia: Secondary | ICD-10-CM

## 2022-07-28 MED ORDER — NA SULFATE-K SULFATE-MG SULF 17.5-3.13-1.6 GM/177ML PO SOLN: 1.0000 | Freq: Once | ORAL | 0 refills | Status: AC

## 2022-07-28 MED ORDER — PANTOPRAZOLE SODIUM 40 MG PO TBEC: 40.0000 mg | DELAYED_RELEASE_TABLET | Freq: Two times a day (BID) | ORAL | 3 refills | Status: DC

## 2022-07-28 NOTE — Patient Instructions (Addendum)
You have been scheduled for a colonoscopy. Please follow written instructions given to you at your visit today.   Please pick up your prep supplies at the pharmacy within the next 1-3 days.  If you use inhalers (even only as needed), please bring them with you on the day of your procedure.  DO NOT TAKE 7 DAYS PRIOR TO TEST- Trulicity (dulaglutide) Ozempic, Wegovy (semaglutide) Mounjaro (tirzepatide) Bydureon Bcise (exanatide extended release)  DO NOT TAKE 1 DAY PRIOR TO YOUR TEST Rybelsus (semaglutide) Adlyxin (lixisenatide) Victoza (liraglutide) Byetta (exanatide) ___________________________________________________________________________   Please take your proton pump inhibitor medication, stop the nexium, increase to the protonix 40 mg twice a day for at least a month, then go down to once a day.   Please take this medication 30 minutes to 1 hour before meals- this makes it more effective.  Avoid spicy and acidic foods Avoid fatty foods Limit your intake of coffee, tea, alcohol, and carbonated drinks Work to maintain a healthy weight Keep the head of the bed elevated at least 3 inches with blocks or a wedge pillow if you are having any nighttime symptoms Stay upright for 2 hours after eating Avoid meals and snacks three to four hours before bedtime  Here some information about pelvic floor dysfunction. This may be contributing to some of your symptoms. We will continue with our evaluation but I do want you to consider adding on fiber supplement with low-dose MiraLAX daily. We could also refer to pelvic floor physical therapy.   Pelvic Floor Dysfunction, Female Pelvic floor dysfunction (PFD) is a condition that results when the group of muscles and connective tissues that support the organs in the pelvis (pelvic floor muscles) do not work well. These muscles and their connections form a sling that supports the colon and bladder. In women, they also support the uterus. PFD  causes pelvic floor muscles to be too weak, too tight, or both. In PFD, muscle movements are not coordinated. This may cause bowel or bladder problems. It may also cause pain. What are the causes? This condition may be caused by an injury to the pelvic area or by a weakening of pelvic muscles. This often results from pregnancy and childbirth or other types of strain. In many cases, the exact cause is not known. What increases the risk? The following factors may make you more likely to develop this condition: Having chronic bladder tissue inflammation (interstitial cystitis). Being an older person. Being overweight. History of radiation treatment for cancer in the pelvic region. Previous pelvic surgery, such as removal of the uterus (hysterectomy). What are the signs or symptoms? Symptoms of this condition vary and may include: Bladder symptoms, such as: Trouble starting urination and emptying the bladder. Frequent urinary tract infections. Leaking urine when coughing, laughing, or exercising (stress incontinence). Having to pass urine urgently or frequently. Pain when passing urine. Bowel symptoms, such as: Constipation. Urgent or frequent bowel movements. Incomplete bowel movements. Painful bowel movements. Leaking stool or gas. Unexplained genital or rectal pain. Genital or rectal muscle spasms. Low back pain. Other symptoms may include: A heavy, full, or aching feeling in the vagina. A bulge that protrudes into the vagina. Pain during or after sex. How is this diagnosed? This condition may be diagnosed based on: Your symptoms and medical history. A physical exam. During the exam, your health care provider may check your pelvic muscles for tightness, spasm, pain, or weakness. This may include a rectal exam and a pelvic exam. In some cases, you  may have diagnostic tests, such as: Electrical muscle function tests. Urine flow testing. X-ray tests of bowel function. Ultrasound of  the pelvic organs. How is this treated? Treatment for this condition depends on the symptoms. Treatment options include: Physical therapy. This may include Kegel exercises to help relax or strengthen the pelvic floor muscles. Biofeedback. This type of therapy provides feedback on how tight your pelvic floor muscles are so that you can learn to control them. Internal or external massage therapy. A treatment that involves electrical stimulation of the pelvic floor muscles to help control pain (transcutaneous electrical nerve stimulation, or TENS). Sound wave therapy (ultrasound) to reduce muscle spasms. Medicines, such as: Muscle relaxants. Bladder control medicines. Surgery to reconstruct or support pelvic floor muscles may be an option if other treatments do not help. Follow these instructions at home: Activity Do your usual activities as told by your health care provider. Ask your health care provider if you should modify any activities. Do pelvic floor strengthening or relaxing exercises at home as told by your physical therapist. Lifestyle Maintain a healthy weight. Eat foods that are high in fiber, such as beans, whole grains, and fresh fruits and vegetables. Limit foods that are high in fat and processed sugars, such as fried or sweet foods. Manage stress with relaxation techniques such as yoga or meditation. General instructions If you have problems with leakage: Use absorbable pads or wear padded underwear. Wash frequently with mild soap. Keep your genital and anal area as clean and dry as possible. Ask your health care provider if you should try a barrier cream to prevent skin irritation. Take warm baths to relieve pelvic muscle tension or spasms. Take over-the-counter and prescription medicines only as told by your health care provider. Keep all follow-up visits. How is this prevented? The cause of PFD is not always known, but there are a few things you can do to reduce the  risk of developing this condition, including: Staying at a healthy weight. Getting regular exercise. Managing stress. Contact a health care provider if: Your symptoms are not improving with home care. You have signs or symptoms of PFD that get worse at home. You develop new signs or symptoms. You have signs of a urinary tract infection, such as: Fever. Chills. Increased urinary frequency. A burning feeling when urinating. You have not had a bowel movement in 3 days (constipation). Summary Pelvic floor dysfunction results when the muscles and connective tissues in your pelvic floor do not work well. These muscles and their connections form a sling that supports your colon and bladder. In women, they also support the uterus. PFD may be caused by an injury to the pelvic area or by a weakening of pelvic muscles. PFD causes pelvic floor muscles to be too weak, too tight, or a combination of both. Symptoms may vary from person to person. In most cases, PFD can be treated with physical therapies and medicines. Surgery may be an option if other treatments do not help. This information is not intended to replace advice given to you by your health care provider. Make sure you discuss any questions you have with your health care provider. Document Revised: 05/06/2020 Document Reviewed: 05/06/2020 Elsevier Patient Education  2022 [employer]    _______________________________________________________  If your blood pressure at your visit was 140/90 or greater, please contact your primary care physician to follow up on this.  _______________________________________________________  If you are age 53 or older, your body mass index should be between 23-30. Your  Body mass index is 22.36 kg/m. If this is out of the aforementioned range listed, please consider follow up with your Primary Care Provider.  If you are age 61 or younger, your body mass index should be between 19-25. Your Body mass  index is 22.36 kg/m. If this is out of the aformentioned range listed, please consider follow up with your Primary Care Provider.   ________________________________________________________  The [facility] providers would like to encourage you to use [facility] to communicate with providers for non-urgent requests or questions.  Due to long hold times on the telephone, sending your provider a message by [facility] may be a faster and more efficient way to get a response.  Please allow 48 business hours for a response.  Please remember that this is for non-urgent requests.  _______________________________________________________ It was a pleasure to see you today!  Thank you for trusting me with your gastrointestinal care!

## 2022-07-28 NOTE — Progress Notes (Signed)
Addendum: Reviewed and agree with assessment and management plan. Pyrtle, Jay M, MD  
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
## 2022-08-25 ENCOUNTER — Telehealth: Payer: Medicare PPO | Admitting: Family Medicine

## 2022-08-25 ENCOUNTER — Encounter: Payer: Self-pay | Admitting: Family Medicine

## 2022-08-25 VITALS — Ht 61.5 in | Wt 120.0 lb

## 2022-08-25 DIAGNOSIS — Z0000 Encounter for general adult medical examination without abnormal findings: Secondary | ICD-10-CM
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
## 2022-08-25 NOTE — Patient Instructions (Addendum)
I really enjoyed getting to talk with you today! I am available on Tuesdays and Thursdays for virtual visits if you have any questions or concerns, or if I can be of any further assistance.   CHECKLIST FROM ANNUAL WELLNESS VISIT:  -Follow up (please call to schedule if not scheduled after visit):   -yearly for annual wellness visit with primary care office  Here is a list of your preventive care/health maintenance measures and the plan for each if any are due:  PLAN For any measures below that may be due:   Health Maintenance  Topic Date Due   Colonoscopy  09/22/2022   COVID-19 Vaccine (6 - 2023-24 season) 09/10/2022 (Originally 12/31/2021)   INFLUENZA VACCINE  04/10/2023 (Originally 08/11/2022)   HEMOGLOBIN A1C  10/01/2022   MAMMOGRAM  12/02/2022   OPHTHALMOLOGY EXAM  05/09/2023   Diabetic kidney evaluation - eGFR measurement  06/03/2023   Diabetic kidney evaluation - Urine ACR  06/03/2023   DTaP/Tdap/Td (2 - Td or Tdap) 06/08/2023   FOOT EXAM  06/10/2023   Medicare Annual Wellness (AWV)  08/25/2023   Pneumonia Vaccine 52+ Years old  Completed   DEXA SCAN  Completed   Hepatitis C Screening  Completed   Zoster Vaccines- Shingrix  Completed   HPV VACCINES  Aged Out    -See a dentist at least yearly  -Get your eyes checked and then per your eye specialist's recommendations  -Other issues addressed today:   -I have included below further information regarding a healthy whole foods based diet, physical activity guidelines for adults, stress management and opportunities for social connections. I hope you find this information useful.   -----------------------------------------------------------------------------------------------------------------------------------------------------------------------------------------------------------------------------------------------------------  NUTRITION: -eat real food: lots of colorful vegetables (half the plate) and fruits -5-7 servings  of vegetables and fruits per day (fresh or steamed is best), exp. 2 servings of vegetables with lunch and dinner and 2 servings of fruit per day. Berries and greens such as kale and collards are great choices.  -consume on a regular basis: whole grains (make sure first ingredient on label contains the word "whole"), fresh fruits, fish, nuts, seeds, healthy oils (such as olive oil, avocado oil, grape seed oil) -may eat small amounts of dairy and lean meat on occasion, but avoid processed meats such as ham, bacon, lunch meat, etc. -drink water -try to avoid fast food and pre-packaged foods, processed meat -most experts advise limiting sodium to < 2300mg  per day, should limit further is any chronic conditions such as high blood pressure, heart disease, diabetes, etc. The American Heart Association advised that < 1500mg  is is ideal -try to avoid foods that contain any ingredients with names you do not recognize  -try to avoid sugar/sweets (except for the natural sugar that occurs in fresh fruit) -try to avoid sweet drinks -try to avoid white rice, white bread, pasta (unless whole grain), white or yellow potatoes  EXERCISE GUIDELINES FOR ADULTS: -if you wish to increase your physical activity, do so gradually and with the approval of your doctor -STOP and seek medical care immediately if you have any chest pain, chest discomfort or trouble breathing when starting or increasing exercise  -move and stretch your body, legs, feet and arms when sitting for long periods -Physical activity guidelines for optimal health in adults: -least 150 minutes per week of aerobic exercise (can talk, but not sing) once approved by your doctor, 20-30 minutes of sustained activity or two 10 minute episodes of sustained activity every day.  -resistance training at  least 2 days per week if approved by your doctor -balance exercises 3+ days per week:   Stand somewhere where you have something sturdy to hold onto if you lose  balance.    1) lift up on toes, start with 5x per day and work up to 20x   2) stand and lift on leg straight out to the side so that foot is a few inches of the floor, start with 5x each side and work up to 20x each side   3) stand on one foot, start with 5 seconds each side and work up to 20 seconds on each side  If you need ideas or help with getting more active:  -Silver sneakers https://tools.silversneakers.com  -Walk with a Doc: http://www.duncan-williams.com/  -try to include resistance (weight lifting/strength building) and balance exercises twice per week: or the following link for ideas: http://castillo-powell.com/  BuyDucts.dk  STRESS MANAGEMENT: -can try meditating, or just sitting quietly with deep breathing while intentionally relaxing all parts of your body for 5 minutes daily -if you need further help with stress, anxiety or depression please follow up with your primary doctor or contact the wonderful folks at [facility]: [phone]  SOCIAL CONNECTIONS: -options in [place] if you wish to engage in more social and exercise related activities:  -Silver sneakers https://tools.silversneakers.com  -Walk with a Doc: http://www.duncan-williams.com/  -Check out the [facility] Active Adults 50+ section on the [place] of [employer] (hiking clubs, book clubs, cards and games, chess, exercise classes, aquatic classes and much more) - see the website for details: https://www.[place].gov/departments/parks-recreation/active-adults50  -YouTube has lots of exercise videos for different ages and abilities as well  -[facility] (a variety of indoor and outdoor inperson activities for adults). ([phone]. [address].  -Virtual Online Classes (a variety of topics): see seniorplanet.org or call [phone]  -consider volunteering at a school, hospice  center, church, senior center or elsewhere

## 2022-08-25 NOTE — Progress Notes (Signed)
Patient was unable to self-report vitals due to a lack of equipment at home via telehealth

## 2022-08-29 ENCOUNTER — Other Ambulatory Visit: Payer: Self-pay | Admitting: Family Medicine

## 2022-08-29 DIAGNOSIS — E785 Hyperlipidemia, unspecified: Secondary | ICD-10-CM

## 2022-09-05 ENCOUNTER — Encounter: Payer: Self-pay | Admitting: Family Medicine

## 2022-09-06 MED ORDER — GABAPENTIN 100 MG PO CAPS: 100.0000 mg | ORAL_CAPSULE | Freq: Two times a day (BID) | ORAL | 0 refills | Status: DC
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
## 2022-09-22 ENCOUNTER — Ambulatory Visit: Payer: Medicare PPO | Admitting: Physician Assistant

## 2022-09-23 ENCOUNTER — Encounter: Payer: Self-pay | Admitting: Internal Medicine

## 2022-09-26 ENCOUNTER — Encounter: Payer: Self-pay | Admitting: Obstetrics and Gynecology

## 2022-09-26 ENCOUNTER — Ambulatory Visit (INDEPENDENT_AMBULATORY_CARE_PROVIDER_SITE_OTHER): Payer: Medicare PPO | Admitting: Obstetrics and Gynecology

## 2022-09-26 VITALS — BP 134/82 | HR 103 | Ht 61.0 in | Wt 118.0 lb

## 2022-09-26 DIAGNOSIS — Z01419 Encounter for gynecological examination (general) (routine) without abnormal findings: Secondary | ICD-10-CM | POA: Diagnosis not present

## 2022-09-26 DIAGNOSIS — M8589 Other specified disorders of bone density and structure, multiple sites: Secondary | ICD-10-CM | POA: Diagnosis not present

## 2022-09-26 DIAGNOSIS — N3946 Mixed incontinence: Secondary | ICD-10-CM

## 2022-09-26 DIAGNOSIS — N812 Incomplete uterovaginal prolapse: Secondary | ICD-10-CM

## 2022-09-26 NOTE — Patient Instructions (Addendum)
EXERCISE AND DIET:  We recommended that you start or continue a regular exercise program for good health. Regular exercise means any activity that makes your heart beat faster and makes you sweat.  We recommend exercising at least 30 minutes per day at least 3 days a week, preferably 4 or 5.  We also recommend a diet low in fat and sugar.  Inactivity, poor dietary choices and obesity can cause diabetes, heart attack, stroke, and kidney damage, among others.    ALCOHOL AND SMOKING:  Women should limit their alcohol intake to no more than 7 drinks/beers/glasses of wine (combined, not each!) per week. Moderation of alcohol intake to this level decreases your risk of breast cancer and liver damage. And of course, no recreational drugs are part of a healthy lifestyle.  And absolutely no smoking or even second hand smoke. Most people know smoking can cause heart and lung diseases, but did you know it also contributes to weakening of your bones? Aging of your skin?  Yellowing of your teeth and nails?  CALCIUM AND VITAMIN D:  Adequate intake of calcium and Vitamin D are recommended.  The recommendations for exact amounts of these supplements seem to change often, but generally speaking 600 mg of calcium (either carbonate or citrate) and 800 units of Vitamin D per day seems prudent. Certain women may benefit from higher intake of Vitamin D.  If you are among these women, your doctor will have told you during your visit.    PAP SMEARS:  Pap smears, to check for cervical cancer or precancers,  have traditionally been done yearly, although recent scientific advances have shown that most women can have pap smears less often.  However, every woman still should have a physical exam from her gynecologist every year. It will include a breast check, inspection of the vulva and vagina to check for abnormal growths or skin changes, a visual exam of the cervix, and then an exam to evaluate the size and shape of the uterus and  ovaries.  And after 72 years of age, a rectal exam is indicated to check for rectal cancers. We will also provide age appropriate advice regarding health maintenance, like when you should have certain vaccines, screening for sexually transmitted diseases, bone density testing, colonoscopy, mammograms, etc.   MAMMOGRAMS:  All women over 37 years old should have a yearly mammogram. Many facilities now offer a "3D" mammogram, which may cost around $50 extra out of pocket. If possible,  we recommend you accept the option to have the 3D mammogram performed.  It both reduces the number of women who will be called back for extra views which then turn out to be normal, and it is better than the routine mammogram at detecting truly abnormal areas.    COLONOSCOPY:  Colonoscopy to screen for colon cancer is recommended for all women at age 72.  We know, you hate the idea of the prep.  We agree, BUT, having colon cancer and not knowing it is worse!!  Colon cancer so often starts as a polyp that can be seen and removed at colonscopy, which can quite literally save your life!  And if your first colonoscopy is normal and you have no family history of colon cancer, most women don't have to have it again for 10 years.  Once every ten years, you can do something that may end up saving your life, right?  We will be happy to help you get it scheduled when you are ready.  Be sure to check your insurance coverage so you understand how much it will cost.  It may be covered as a preventative service at no cost, but you should check your particular policy.    Overactive Bladder, Adult  Overactive bladder is a condition in which a person has a sudden and frequent need to urinate. A person might also leak urine if he or she cannot get to the bathroom fast enough (urinary incontinence). Sometimes, symptoms can interfere with work or social activities. What are the causes? Overactive bladder is associated with poor nerve signals  between your bladder and your brain. Your bladder may get the signal to empty before it is full. You may also have very sensitive muscles that make your bladder squeeze too soon. This condition may also be caused by other factors, such as: Medical conditions: Urinary tract infection. Infection of nearby tissues. Prostate enlargement. Bladder stones, inflammation, or tumors. Diabetes. Muscle or nerve weakness, especially from these conditions: A spinal cord injury. Stroke. Multiple sclerosis. Parkinson's disease. Other causes: Surgery on the uterus or urethra. Drinking too much caffeine or alcohol. Certain medicines, especially those that eliminate extra fluid in the body (diuretics). Constipation. What increases the risk? You may be at greater risk for overactive bladder if you: Are an older adult. Smoke. Are going through menopause. Have prostate problems. Have a neurological disease, such as stroke, dementia, Parkinson's disease, or multiple sclerosis (MS). Eat or drink alcohol, spicy food, caffeine, and other things that irritate the bladder. Are overweight or obese. What are the signs or symptoms? Symptoms of this condition include a sudden, strong urge to urinate. Other symptoms include: Leaking urine. Urinating 8 or more times a day. Waking up to urinate 2 or more times overnight. How is this diagnosed? This condition may be diagnosed based on: Your symptoms and medical history. A physical exam. Blood or urine tests to check for possible causes, such as infection. You may also need to see a health care provider who specializes in urinary tract problems. This is called a urologist. How is this treated? Treatment for overactive bladder depends on the cause of your condition and whether it is mild or severe. Treatment may include: Bladder training, such as: Learning to control the urge to urinate by following a schedule to urinate at regular intervals. Doing Kegel  exercises to strengthen the pelvic floor muscles that support your bladder. Special devices, such as: Biofeedback. This uses sensors to help you become aware of your body's signals. Electrical stimulation. This uses electrodes placed inside the body (implanted) or outside the body. These electrodes send gentle pulses of electricity to strengthen the nerves or muscles that control the bladder. Women may use a plastic device, called a pessary, that fits into the vagina and supports the bladder. Medicines, such as: Antibiotics to treat bladder infection. Antispasmodics to stop the bladder from releasing urine at the wrong time. Tricyclic antidepressants to relax bladder muscles. Injections of botulinum toxin type A directly into the bladder tissue to relax bladder muscles. Surgery, such as: A device may be implanted to help manage the nerve signals that control urination. An electrode may be implanted to stimulate electrical signals in the bladder. A procedure may be done to change the shape of the bladder. This is done only in very severe cases. Follow these instructions at home: Eating and drinking  Make diet or lifestyle changes recommended by your health care provider. These may include: Drinking fluids throughout the day and not only with meals.  Cutting down on caffeine or alcohol. Eating a healthy and balanced diet to prevent constipation. This may include: Choosing foods that are high in fiber, such as beans, whole grains, and fresh fruits and vegetables. Limiting foods that are high in fat and processed sugars, such as fried and sweet foods. Lifestyle  Lose weight if needed. Do not use any products that contain nicotine or tobacco. These include cigarettes, chewing tobacco, and vaping devices, such as e-cigarettes. If you need help quitting, ask your health care provider. General instructions Take over-the-counter and prescription medicines only as told by your health care  provider. If you were prescribed an antibiotic medicine, take it as told by your health care provider. Do not stop taking the antibiotic even if you start to feel better. Use any implants or pessary as told by your health care provider. If needed, wear pads to absorb urine leakage. Keep a log to track how much and when you drink, and when you need to urinate. This will help your health care provider monitor your condition. Keep all follow-up visits. This is important. Contact a health care provider if: You have a fever or chills. Your symptoms do not get better with treatment. Your pain and discomfort get worse. You have more frequent urges to urinate. Get help right away if: You are not able to control your bladder. Summary Overactive bladder refers to a condition in which a person has a sudden and frequent need to urinate. Several conditions may lead to an overactive bladder. Treatment for overactive bladder depends on the cause and severity of your condition. Making lifestyle changes, doing Kegel exercises, keeping a log, and taking medicines can help with this condition. This information is not intended to replace advice given to you by your health care provider. Make sure you discuss any questions you have with your health care provider. Document Revised: 09/16/2019 Document Reviewed: 09/16/2019 Elsevier Patient Education  2024 [employer].

## 2022-10-07 ENCOUNTER — Ambulatory Visit: Payer: Medicare PPO | Admitting: Internal Medicine

## 2022-10-07 ENCOUNTER — Encounter: Payer: Self-pay | Admitting: Internal Medicine

## 2022-10-07 VITALS — BP 129/94 | HR 74 | Temp 97.3°F | Resp 17 | Ht 61.0 in | Wt 118.0 lb

## 2022-10-07 DIAGNOSIS — Z09 Encounter for follow-up examination after completed treatment for conditions other than malignant neoplasm: Secondary | ICD-10-CM

## 2022-10-07 DIAGNOSIS — E785 Hyperlipidemia, unspecified: Secondary | ICD-10-CM | POA: Diagnosis not present

## 2022-10-07 DIAGNOSIS — Z86010 Personal history of colonic polyps: Secondary | ICD-10-CM | POA: Diagnosis not present

## 2022-10-07 DIAGNOSIS — R1310 Dysphagia, unspecified: Secondary | ICD-10-CM | POA: Diagnosis not present

## 2022-10-07 DIAGNOSIS — K219 Gastro-esophageal reflux disease without esophagitis: Secondary | ICD-10-CM

## 2022-10-07 DIAGNOSIS — D122 Benign neoplasm of ascending colon: Secondary | ICD-10-CM | POA: Diagnosis not present

## 2022-10-07 DIAGNOSIS — R1319 Other dysphagia: Secondary | ICD-10-CM

## 2022-10-07 DIAGNOSIS — E119 Type 2 diabetes mellitus without complications: Secondary | ICD-10-CM | POA: Diagnosis not present

## 2022-10-07 DIAGNOSIS — Z1211 Encounter for screening for malignant neoplasm of colon: Secondary | ICD-10-CM | POA: Diagnosis not present

## 2022-10-07 DIAGNOSIS — I10 Essential (primary) hypertension: Secondary | ICD-10-CM | POA: Diagnosis not present

## 2022-10-07 MED ORDER — SODIUM CHLORIDE 0.9 % IV SOLN: 500.0000 mL | Freq: Once | INTRAVENOUS | Status: DC

## 2022-10-07 NOTE — Progress Notes (Signed)
Called to room to assist during endoscopic procedure.  Patient ID and intended procedure confirmed with present staff. Received instructions for my participation in the procedure from the performing physician.  

## 2022-10-07 NOTE — Progress Notes (Signed)
Patient with a dry chronic cough. IV Lidocaine administered pre-sedation. Uneventful anesthetic. Report to pacu rn. Vss. Care resumed by rn.

## 2022-10-07 NOTE — Op Note (Signed)
[facility] Patient Name: Stephanie Grimes Procedure Date: 10/07/2022 2:22 PM MRN: [id number] Endoscopist: Beverley Fiedler , MD, [id number] Age: 72 Referring MD:  Date of Birth: January 23, 1950 Gender: Female Account #: [id number] Procedure:                Upper GI endoscopy Indications:              Dysphagia, Gastro-esophageal reflux disease Medicines:                Monitored Anesthesia Care Procedure:                Pre-Anesthesia Assessment:                           - Prior to the procedure, a History and Physical                            was performed, and patient medications and                            allergies were reviewed. The patient's tolerance of                            previous anesthesia was also reviewed. The risks                            and benefits of the procedure and the sedation                            options and risks were discussed with the patient.                            All questions were answered, and informed consent                            was obtained. Prior Anticoagulants: The patient has                            taken no anticoagulant or antiplatelet agents. ASA                            Grade Assessment: II - A patient with mild systemic                            disease. After reviewing the risks and benefits,                            the patient was deemed in satisfactory condition to                            undergo the procedure.                           After obtaining informed consent, the endoscope was  passed under direct vision. Throughout the                            procedure, the patient's blood pressure, pulse, and                            oxygen saturations were monitored continuously. The                            GIF [id number] #[id number] was introduced through the                            mouth, and advanced to the second part of duodenum.                            The upper GI  endoscopy was accomplished without                            difficulty. The patient tolerated the procedure                            well. Scope In: Scope Out: Findings:                 The examined esophagus was normal.                           No endoscopic abnormality was evident in the                            esophagus to explain the patient's complaint of                            dysphagia. It was decided, however, to proceed with                            dilation of the entire esophagus. The scope was                            withdrawn. Dilation was performed with a Maloney                            dilator with mild resistance at 52 Fr.                           The entire examined stomach was normal.                           The examined duodenum was normal. Complications:            No immediate complications. Estimated Blood Loss:     Estimated blood loss: none. Impression:               - Normal esophagus.                           -  No endoscopic esophageal abnormality to explain                            patient's dysphagia. Esophagus dilated with 52 Fr                            Maloney.                           - Normal stomach.                           - Normal examined duodenum.                           - No specimens collected. Recommendation:           - Patient has a contact number available for                            emergencies. The signs and symptoms of potential                            delayed complications were discussed with the                            patient. Return to normal activities tomorrow.                            Written discharge instructions were provided to the                            patient.                           - Resume previous diet.                           - Continue present medications. Can reduce to                            pantoprazole 40 mg once daily. If breakthrough                             heartburn then famotidine 20 mg can be added in the                            evening as needed.                           - See the other procedure note for documentation of                            additional recommendations. Beverley Fiedler, MD 10/07/2022 3:03:57 PM This report has been signed electronically.

## 2022-10-07 NOTE — Progress Notes (Signed)
Vitals-SH  Pt's states no medical or surgical changes since previsit or office visit. 

## 2022-10-07 NOTE — Op Note (Signed)
[facility] Patient Name: Stephanie Grimes Procedure Date: 10/07/2022 2:13 PM MRN: [id number] Endoscopist: Beverley Fiedler , MD, [id number] Age: 72 Referring MD:  Date of Birth: 11-Nov-1950 Gender: Female Account #: [id number] Procedure:                Colonoscopy Indications:              High risk colon cancer surveillance: Personal                            history of adenomas (including large adenoma with                            EMR in 2015), Last colonoscopy: September 2019 (no                            adenomas on last exam) Medicines:                Monitored Anesthesia Care Procedure:                Pre-Anesthesia Assessment:                           - Prior to the procedure, a History and Physical                            was performed, and patient medications and                            allergies were reviewed. The patient's tolerance of                            previous anesthesia was also reviewed. The risks                            and benefits of the procedure and the sedation                            options and risks were discussed with the patient.                            All questions were answered, and informed consent                            was obtained. Prior Anticoagulants: The patient has                            taken no anticoagulant or antiplatelet agents. ASA                            Grade Assessment: II - A patient with mild systemic                            disease. After reviewing the risks and benefits,  the patient was deemed in satisfactory condition to                            undergo the procedure.                           After obtaining informed consent, the colonoscope                            was passed under direct vision. Throughout the                            procedure, the patient's blood pressure, pulse, and                            oxygen saturations were monitored  continuously. The                            Olympus [id number]-[id number] (#[id number]) Colonoscope was                            introduced through the anus and advanced to the                            cecum, identified by appendiceal orifice and                            ileocecal valve. The colonoscopy was performed                            without difficulty. The patient tolerated the                            procedure well. The quality of the bowel                            preparation was good. The ileocecal valve,                            appendiceal orifice, and rectum were photographed. Scope In: 2:38:30 PM Scope Out: 2:56:33 PM Scope Withdrawal Time: 0 hours 13 minutes 55 seconds  Total Procedure Duration: 0 hours 18 minutes 3 seconds  Findings:                 The digital rectal exam was normal.                           A 4 mm polyp was found in the ascending colon. The                            polyp was sessile. The polyp was removed with a                            cold snare. Resection and retrieval were complete.  A tattoo was seen in the ascending colon. A                            post-polypectomy scar was found at the tattoo site.                            There was no evidence of residual polyp tissue.                           The exam was otherwise without abnormality on                            direct and retroflexion views. Complications:            No immediate complications. Estimated Blood Loss:     Estimated blood loss: none. Impression:               - One 4 mm polyp in the ascending colon, removed                            with a cold snare. Resected and retrieved.                           - A tattoo was seen in the ascending colon. A                            post-polypectomy scar was found at the tattoo site.                            There was no evidence of residual polyp tissue.                           - The  examination was otherwise normal on direct                            and retroflexion views. Recommendation:           - Patient has a contact number available for                            emergencies. The signs and symptoms of potential                            delayed complications were discussed with the                            patient. Return to normal activities tomorrow.                            Written discharge instructions were provided to the                            patient.                           -  Resume previous diet.                           - Continue present medications.                           - Await pathology results.                           - Repeat colonoscopy in 5 years for surveillance. Beverley Fiedler, MD 10/07/2022 3:06:04 PM This report has been signed electronically.
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
## 2022-10-07 NOTE — Patient Instructions (Signed)
Handouts provided about polyps. Resume previous diet. Continue present medications.  Can reduce to pantoprazole 40 mg once daily.  If breakthrough heartburn then famotidine 20 mg can be added in the evening as needed.   Await pathology results.  Repeat colonoscopy in 5 years for surveillance.   YOU HAD AN ENDOSCOPIC PROCEDURE TODAY AT THE [facility]:   Refer to the procedure report that was given to you for any specific questions about what was found during the examination.  If the procedure report does not answer your questions, please call your gastroenterologist to clarify.  If you requested that your care partner not be given the details of your procedure findings, then the procedure report has been included in a sealed envelope for you to review at your convenience later.  YOU SHOULD EXPECT: Some feelings of bloating in the abdomen. Passage of more gas than usual.  Walking can help get rid of the air that was put into your GI tract during the procedure and reduce the bloating. If you had a lower endoscopy (such as a colonoscopy or flexible sigmoidoscopy) you may notice spotting of blood in your stool or on the toilet paper. If you underwent a bowel prep for your procedure, you may not have a normal bowel movement for a few days.  Please Note:  You might notice some irritation and congestion in your nose or some drainage.  This is from the oxygen used during your procedure.  There is no need for concern and it should clear up in a day or so.  SYMPTOMS TO REPORT IMMEDIATELY:  Following lower endoscopy (colonoscopy or flexible sigmoidoscopy):  Excessive amounts of blood in the stool  Significant tenderness or worsening of abdominal pains  Swelling of the abdomen that is new, acute  Fever of 100F or higher  Following upper endoscopy (EGD)  Vomiting of blood or coffee ground material  New chest pain or pain under the shoulder blades  Painful or persistently difficult  swallowing  New shortness of breath  Fever of 100F or higher  Black, tarry-looking stools  For urgent or emergent issues, a gastroenterologist can be reached at any hour by calling [phone]. Do not use MyChart messaging for urgent concerns.    DIET:  We do recommend a small meal at first, but then you may proceed to your regular diet.  Drink plenty of fluids but you should avoid alcoholic beverages for 24 hours.  ACTIVITY:  You should plan to take it easy for the rest of today and you should NOT DRIVE or use heavy machinery until tomorrow (because of the sedation medicines used during the test).    FOLLOW UP: Our staff will call the number listed on your records the next business day following your procedure.  We will call around 7:15- 8:00 am to check on you and address any questions or concerns that you may have regarding the information given to you following your procedure. If we do not reach you, we will leave a message.     If any biopsies were taken you will be contacted by phone or by letter within the next 1-3 weeks.  Please call us at [phone] if you have not heard about the biopsies in 3 weeks.    SIGNATURES/CONFIDENTIALITY: You and/or your care partner have signed paperwork which will be entered into your electronic medical record.  These signatures attest to the fact that that the information above on your After Visit Summary has been reviewed  and is understood.  Full responsibility of the confidentiality of this discharge information lies with you and/or your care-partner.

## 2022-10-10 ENCOUNTER — Telehealth: Payer: Self-pay | Admitting: *Deleted

## 2022-10-10 NOTE — Telephone Encounter (Signed)
  Follow up Call-     10/07/2022    1:43 PM 10/07/2022    1:32 PM  Call back number  Post procedure Call Back phone  # [phone]   Permission to leave phone message  Yes   [facility]

## 2022-10-14 LAB — SURGICAL PATHOLOGY

## 2022-10-19 ENCOUNTER — Encounter: Payer: Self-pay | Admitting: Internal Medicine

## 2022-12-06 ENCOUNTER — Other Ambulatory Visit: Payer: Self-pay | Admitting: Family Medicine

## 2022-12-06 DIAGNOSIS — E118 Type 2 diabetes mellitus with unspecified complications: Secondary | ICD-10-CM

## 2022-12-07 DIAGNOSIS — Z1231 Encounter for screening mammogram for malignant neoplasm of breast: Secondary | ICD-10-CM | POA: Diagnosis not present

## 2022-12-07 LAB — HM MAMMOGRAPHY

## 2022-12-12 ENCOUNTER — Ambulatory Visit (INDEPENDENT_AMBULATORY_CARE_PROVIDER_SITE_OTHER): Payer: Medicare PPO | Admitting: Family Medicine

## 2022-12-12 ENCOUNTER — Encounter: Payer: Self-pay | Admitting: Obstetrics and Gynecology

## 2022-12-12 ENCOUNTER — Encounter: Payer: Self-pay | Admitting: Family Medicine

## 2022-12-12 VITALS — BP 136/80 | HR 95 | Temp 97.9°F | Ht 61.0 in | Wt 120.5 lb

## 2022-12-12 DIAGNOSIS — E118 Type 2 diabetes mellitus with unspecified complications: Secondary | ICD-10-CM | POA: Diagnosis not present

## 2022-12-12 DIAGNOSIS — I10 Essential (primary) hypertension: Secondary | ICD-10-CM

## 2022-12-12 DIAGNOSIS — Z7985 Long-term (current) use of injectable non-insulin antidiabetic drugs: Secondary | ICD-10-CM | POA: Diagnosis not present

## 2022-12-12 LAB — POCT GLYCOSYLATED HEMOGLOBIN (HGB A1C): Hemoglobin A1C: 6.3 % — AB (ref 4.0–5.6)

## 2022-12-12 MED ORDER — PANTOPRAZOLE SODIUM 40 MG PO TBEC: 40.0000 mg | DELAYED_RELEASE_TABLET | Freq: Every day | ORAL | Status: DC

## 2022-12-12 NOTE — Progress Notes (Signed)
Established Patient Office Visit  Subjective   Patient ID: Stephanie Grimes, female    DOB: Jul 17, 1950  Age: 72 y.o. MRN: [id number]  Chief Complaint  Patient presents with   Medical Management of Chronic Issues    Patient is here for 6 month follow up on diabetes and HTN.  DM-- A1C performed in office today and is 6.3, stable from previous. She reports that her blood sugars are stable at home. She continues on synjardy 5/500 BID. She has been taking gabapentin only at night for her chronic cough.   HTN -- BP in office performed and is well controlled. She  reports no side effects to the medications, no chest pain, SOB, dizziness or headaches. She has a BP cuff at home and is checking BP regularly, reports they are in the normal range.   Chronic cough-- a combination of GERD and post nasal drip. She reports that this is fairly well controlled with the gabapentin at night and the pantoprazole, states she is only taking this medication once a day and it is working well for her. Reviewed colonoscopy results, she is going back in 5 years for repeat C-scope due to a few polyps found.  Reviewed health maintenance, she did have her DEXA scan at [facility], will ask for records  Pt is reporting chronic right hip pain and some low back pain that comes and goes , located down in her right buttock area. States that she occasionally takes ibuprofen or tylenol but mostly she just "lives with it".     Current Outpatient Medications  Medication Instructions   atorvastatin (LIPITOR) 40 MG tablet TAKE 1 TABLET(40 MG) BY MOUTH DAILY   blood glucose meter kit and supplies KIT Dispense based on patient and insurance preference. Use up to four times daily as directed.   Calcium Carbonate-Vit D-Min (CALCIUM 1200 PO) Oral, Daily   chlorpheniramine (CHLOR-TRIMETON) 4 MG tablet Oral   Cholecalciferol 125 MCG (5000 UT) TABS Oral   docusate sodium (COLACE) 100 mg, Oral, 2 times daily   Empagliflozin-metFORMIN HCl  (SYNJARDY) 5-500 MG TABS 1 tablet, Oral, 2 times daily   fluticasone (FLONASE) 50 MCG/ACT nasal spray 2 sprays, Nasal, Daily   gabapentin (NEURONTIN) 100 mg, Oral, 2 times daily, And as needed   ibuprofen (ADVIL) 200 mg, Oral, As needed   losartan (COZAAR) 25 MG tablet TAKE 1 TABLET(25 MG) BY MOUTH DAILY   Multiple Vitamin (MULTIVITAMIN) capsule 1 capsule, Oral, Daily,     pantoprazole (PROTONIX) 40 mg, Oral, Daily    Patient Active Problem List   Diagnosis Date Noted   Chronic idiopathic constipation 07/28/2022   Hypertension 12/06/2021   Localized osteoarthritis of hand 12/06/2021   Hyperlipidemia associated with type 2 diabetes mellitus (HCC) 12/04/2019   Post-nasal drainage 11/22/2017   Vertigo 11/22/2017   Osteoporosis 08/24/2016   Type II diabetes mellitus with complication (HCC) 05/24/2016   History of adenomatous polyp of colon    Esophageal reflux 09/24/2010   Globus sensation 09/24/2010   Raynauds phenomenon 09/24/2010      Review of Systems  All other systems reviewed and are negative.     Objective:     BP 136/80 (BP Location: Left Arm, Patient Position: Sitting, Cuff Size: Normal)   Pulse 95   Temp 97.9 F (36.6 C) (Oral)   Ht 5\' 1"  (1.549 m)   Wt 120 lb 8 oz (54.7 kg)   LMP 01/10/1998   SpO2 99%   BMI 22.77 kg/m    Physical  Exam Vitals reviewed.  Constitutional:      Appearance: Normal appearance. She is well-groomed and normal weight.  Eyes:     Conjunctiva/sclera: Conjunctivae normal.  Neck:     Thyroid: No thyromegaly.  Cardiovascular:     Rate and Rhythm: Normal rate and regular rhythm.     Pulses: Normal pulses.     Heart sounds: S1 normal and S2 normal.  Pulmonary:     Effort: Pulmonary effort is normal.     Breath sounds: Normal breath sounds and air entry.  Abdominal:     General: Bowel sounds are normal.  Musculoskeletal:     Right lower leg: No edema.     Left lower leg: No edema.  Neurological:     Mental Status: She is alert  and oriented to person, place, and time. Mental status is at baseline.     Gait: Gait is intact.  Psychiatric:        Mood and Affect: Mood and affect normal.        Speech: Speech normal.        Behavior: Behavior normal.        Judgment: Judgment normal.      Results for orders placed or performed in visit on 12/12/22  POC HgB A1c  Result Value Ref Range   Hemoglobin A1C 6.3 (A) 4.0 - 5.6 %   HbA1c POC (<> result, manual entry)     HbA1c, POC (prediabetic range)     HbA1c, POC (controlled diabetic range)    Results for orders placed or performed in visit on 12/12/22  HM MAMMOGRAPHY  Result Value Ref Range   HM Mammogram 0-4 Bi-Rad 0-4 Bi-Rad, Self Reported Normal      The ASCVD Risk score (Arnett DK, et al., 2019) failed to calculate for the following reasons:   The valid total cholesterol range is 130 to 320 mg/dL    Assessment & Plan:  Type II diabetes mellitus with complication [facility]) Assessment & Plan: Chronic, stable.  she is very well controlled, A1C today is 6.3 which is still good. RTC in May at her regular appt. Continue Synjardy 5/500 BID. Pt on lipitor 40 mg daily for risk factor reduction.   Orders: -     POCT glycosylated hemoglobin (Hb A1C)  Primary hypertension Assessment & Plan: BP is well controlled today in office, will continue losartan 25 mg daily.    Other orders -     Pantoprazole Sodium; Take 1 tablet (40 mg total) by mouth daily.   Will ask for records from [facility] for her DEXA scan result.   Return in about 6 months (around 06/12/2023) for annual physical exam.    Karie Georges, MD

## 2022-12-12 NOTE — Assessment & Plan Note (Signed)
Chronic, stable.  she is very well controlled, A1C today is 6.3 which is still good. RTC in May at her regular appt. Continue Synjardy 5/500 BID. Pt on lipitor 40 mg daily for risk factor reduction.

## 2022-12-12 NOTE — Assessment & Plan Note (Signed)
BP is well controlled today in office, will continue losartan 25 mg daily.

## 2022-12-14 DIAGNOSIS — M8588 Other specified disorders of bone density and structure, other site: Secondary | ICD-10-CM | POA: Diagnosis not present

## 2022-12-14 DIAGNOSIS — Z8262 Family history of osteoporosis: Secondary | ICD-10-CM | POA: Diagnosis not present

## 2022-12-14 LAB — HM DEXA SCAN

## 2022-12-15 ENCOUNTER — Encounter: Payer: Self-pay | Admitting: Family Medicine

## 2022-12-15 DIAGNOSIS — M810 Age-related osteoporosis without current pathological fracture: Secondary | ICD-10-CM

## 2022-12-16 DIAGNOSIS — R922 Inconclusive mammogram: Secondary | ICD-10-CM | POA: Diagnosis not present

## 2022-12-16 DIAGNOSIS — N6489 Other specified disorders of breast: Secondary | ICD-10-CM | POA: Diagnosis not present

## 2022-12-16 MED ORDER — ALENDRONATE SODIUM 70 MG PO TABS: 70.0000 mg | ORAL_TABLET | ORAL | 3 refills | Status: DC

## 2022-12-16 NOTE — Progress Notes (Signed)
Do I need to put in the additional spot compression and [place] orders to go to [place]?

## 2023-01-19 ENCOUNTER — Other Ambulatory Visit: Payer: Self-pay | Admitting: Family Medicine

## 2023-02-09 ENCOUNTER — Other Ambulatory Visit: Payer: Self-pay | Admitting: Family Medicine

## 2023-03-03 ENCOUNTER — Other Ambulatory Visit: Payer: Self-pay | Admitting: Family Medicine

## 2023-03-03 DIAGNOSIS — E1169 Type 2 diabetes mellitus with other specified complication: Secondary | ICD-10-CM

## 2023-03-10 ENCOUNTER — Other Ambulatory Visit: Payer: Self-pay | Admitting: Physician Assistant

## 2023-03-10 ENCOUNTER — Other Ambulatory Visit: Payer: Self-pay | Admitting: Family Medicine

## 2023-03-10 DIAGNOSIS — E118 Type 2 diabetes mellitus with unspecified complications: Secondary | ICD-10-CM

## 2023-05-17 DIAGNOSIS — H2513 Age-related nuclear cataract, bilateral: Secondary | ICD-10-CM | POA: Diagnosis not present

## 2023-05-17 DIAGNOSIS — E119 Type 2 diabetes mellitus without complications: Secondary | ICD-10-CM | POA: Diagnosis not present

## 2023-05-17 DIAGNOSIS — H5069 Other mechanical strabismus: Secondary | ICD-10-CM | POA: Diagnosis not present

## 2023-05-17 DIAGNOSIS — H18513 Endothelial corneal dystrophy, bilateral: Secondary | ICD-10-CM | POA: Diagnosis not present

## 2023-05-17 LAB — OPHTHALMOLOGY REPORT-SCANNED

## 2023-06-12 ENCOUNTER — Ambulatory Visit (INDEPENDENT_AMBULATORY_CARE_PROVIDER_SITE_OTHER): Payer: Medicare PPO | Admitting: Family Medicine

## 2023-06-12 ENCOUNTER — Ambulatory Visit

## 2023-06-12 VITALS — BP 136/76 | HR 82 | Temp 97.7°F | Ht 60.5 in | Wt 116.8 lb

## 2023-06-12 DIAGNOSIS — M898X8 Other specified disorders of bone, other site: Secondary | ICD-10-CM | POA: Diagnosis not present

## 2023-06-12 DIAGNOSIS — E118 Type 2 diabetes mellitus with unspecified complications: Secondary | ICD-10-CM

## 2023-06-12 DIAGNOSIS — Z0000 Encounter for general adult medical examination without abnormal findings: Secondary | ICD-10-CM

## 2023-06-12 DIAGNOSIS — M25551 Pain in right hip: Secondary | ICD-10-CM | POA: Diagnosis not present

## 2023-06-12 DIAGNOSIS — M1611 Unilateral primary osteoarthritis, right hip: Secondary | ICD-10-CM | POA: Diagnosis not present

## 2023-06-12 LAB — COMPREHENSIVE METABOLIC PANEL WITH GFR
ALT: 15 U/L (ref 0–35)
AST: 16 U/L (ref 0–37)
Albumin: 4.7 g/dL (ref 3.5–5.2)
Alkaline Phosphatase: 48 U/L (ref 39–117)
BUN: 17 mg/dL (ref 6–23)
CO2: 29 meq/L (ref 19–32)
Calcium: 10 mg/dL (ref 8.4–10.5)
Chloride: 104 meq/L (ref 96–112)
Creatinine, Ser: 0.65 mg/dL (ref 0.40–1.20)
GFR: 87.7 mL/min (ref >60.00)
Glucose, Bld: 131 mg/dL — ABNORMAL HIGH (ref 70–99)
Potassium: 4.5 meq/L (ref 3.5–5.1)
Sodium: 141 meq/L (ref 135–145)
Total Bilirubin: 0.6 mg/dL (ref 0.2–1.2)
Total Protein: 7.4 g/dL (ref 6.0–8.3)

## 2023-06-12 LAB — POCT GLYCOSYLATED HEMOGLOBIN (HGB A1C): Hemoglobin A1C: 6.3 % — AB (ref 4.0–5.6)

## 2023-06-12 LAB — MICROALBUMIN / CREATININE URINE RATIO
Creatinine,U: 58.5 mg/dL
Microalb Creat Ratio: 14.7 mg/g (ref 0.0–30.0)
Microalb, Ur: 0.9 mg/dL (ref 0.0–1.9)

## 2023-06-12 LAB — LIPID PANEL
Cholesterol: 145 mg/dL (ref 0–200)
HDL: 67.8 mg/dL (ref >39.00)
LDL Cholesterol: 60 mg/dL (ref 0–99)
NonHDL: 76.8
Total CHOL/HDL Ratio: 2
Triglycerides: 82 mg/dL (ref 0.0–149.0)
VLDL: 16.4 mg/dL (ref 0.0–40.0)

## 2023-06-12 NOTE — Patient Instructions (Addendum)
 Tdap booster is due RSV vaccination is also recommended  Health Maintenance, Female Adopting a healthy lifestyle and getting preventive care are important in promoting health and wellness. Ask your health care provider about: The right schedule for you to have regular tests and exams. Things you can do on your own to prevent diseases and keep yourself healthy. What should I know about diet, weight, and exercise? Eat a healthy diet  Eat a diet that includes plenty of vegetables, fruits, low-fat dairy products, and lean protein. Do not eat a lot of foods that are high in solid fats, added sugars, or sodium. Maintain a healthy weight Body mass index (BMI) is used to identify weight problems. It estimates body fat based on height and weight. Your health care provider can help determine your BMI and help you achieve or maintain a healthy weight. Get regular exercise Get regular exercise. This is one of the most important things you can do for your health. Most adults should: Exercise for at least 150 minutes each week. The exercise should increase your heart rate and make you sweat (moderate-intensity exercise). Do strengthening exercises at least twice a week. This is in addition to the moderate-intensity exercise. Spend less time sitting. Even light physical activity can be beneficial. Watch cholesterol and blood lipids Have your blood tested for lipids and cholesterol at 73 years of age, then have this test every 5 years. Have your cholesterol levels checked more often if: Your lipid or cholesterol levels are high. You are older than 73 years of age. You are at high risk for heart disease. What should I know about cancer screening? Depending on your health history and family history, you may need to have cancer screening at various ages. This may include screening for: Breast cancer. Cervical cancer. Colorectal cancer. Skin cancer. Lung cancer. What should I know about heart disease,  diabetes, and high blood pressure? Blood pressure and heart disease High blood pressure causes heart disease and increases the risk of stroke. This is more likely to develop in people who have high blood pressure readings or are overweight. Have your blood pressure checked: Every 3-5 years if you are 20-95 years of age. Every year if you are 51 years old or older. Diabetes Have regular diabetes screenings. This checks your fasting blood sugar level. Have the screening done: Once every three years after age 35 if you are at a normal weight and have a low risk for diabetes. More often and at a younger age if you are overweight or have a high risk for diabetes. What should I know about preventing infection? Hepatitis B If you have a higher risk for hepatitis B, you should be screened for this virus. Talk with your health care provider to find out if you are at risk for hepatitis B infection. Hepatitis C Testing is recommended for: Everyone born from [birth year] through 1965. Anyone with known risk factors for hepatitis C. Sexually transmitted infections (STIs) Get screened for STIs, including gonorrhea and chlamydia, if: You are sexually active and are younger than 73 years of age. You are older than 73 years of age and your health care provider tells you that you are at risk for this type of infection. Your sexual activity has changed since you were last screened, and you are at increased risk for chlamydia or gonorrhea. Ask your health care provider if you are at risk. Ask your health care provider about whether you are at high risk for HIV. Your health care  provider may recommend a prescription medicine to help prevent HIV infection. If you choose to take medicine to prevent HIV, you should first get tested for HIV. You should then be tested every 3 months for as long as you are taking the medicine. Pregnancy If you are about to stop having your period (premenopausal) and you may become pregnant,  seek counseling before you get pregnant. Take 400 to 800 micrograms (mcg) of folic acid  every day if you become pregnant. Ask for birth control (contraception) if you want to prevent pregnancy. Osteoporosis and menopause Osteoporosis is a disease in which the bones lose minerals and strength with aging. This can result in bone fractures. If you are 14 years old or older, or if you are at risk for osteoporosis and fractures, ask your health care provider if you should: Be screened for bone loss. Take a calcium  or vitamin D  supplement to lower your risk of fractures. Be given hormone replacement therapy (HRT) to treat symptoms of menopause. Follow these instructions at home: Alcohol use Do not drink alcohol if: Your health care provider tells you not to drink. You are pregnant, may be pregnant, or are planning to become pregnant. If you drink alcohol: Limit how much you have to: 0-1 drink a day. Know how much alcohol is in your drink. In the [place] one drink equals one 12 oz bottle of beer (355 mL), one 5 oz glass of wine (148 mL), or one 1 oz glass of hard liquor (44 mL). Lifestyle Do not use any products that contain nicotine or tobacco. These products include cigarettes, chewing tobacco, and vaping devices, such as e-cigarettes. If you need help quitting, ask your health care provider. Do not use street drugs. Do not share needles. Ask your health care provider for help if you need support or information about quitting drugs. General instructions Schedule regular health, dental, and eye exams. Stay current with your vaccines. Tell your health care provider if: You often feel depressed. You have ever been abused or do not feel safe at home. Summary Adopting a healthy lifestyle and getting preventive care are important in promoting health and wellness. Follow your health care provider's instructions about healthy diet, exercising, and getting tested or screened for diseases. Follow your  health care provider's instructions on monitoring your cholesterol and blood pressure. This information is not intended to replace advice given to you by your health care provider. Make sure you discuss any questions you have with your health care provider. Document Revised: 05/18/2020 Document Reviewed: 05/18/2020 Elsevier Patient Education  2024 [employer].

## 2023-06-12 NOTE — Progress Notes (Signed)
 Complete physical exam  Patient: Stephanie Grimes   DOB: 01-28-1950   73 y.o. Female  MRN: [id number]  Subjective:     Chief Complaint  Patient presents with   Annual Exam    Stephanie Grimes is a 73 y.o. female who presents today for a complete physical exam. She reports consuming a diabetic, reduced calorie diet. Home exercise routine includes walking 1-2 hrs per week. She generally feels well. She reports sleeping fairly well. She does not have additional problems to discuss today.    Most recent fall risk assessment:    06/12/2023    9:03 AM  Fall Risk   Falls in the past year? 1  Number falls in past yr: 0  Injury with Fall? 0  Risk for fall due to : Impaired balance/gait  Follow up Falls evaluation completed     Most recent depression screenings:    06/12/2023    9:16 AM 06/12/2023    9:04 AM  PHQ 2/9 Scores  PHQ - 2 Score 2 0  PHQ- 9 Score 9 2    Vision:sees Dr. Candi Chafe, is having cataract surgery this month.  and Dental: No current dental problems and Receives regular dental care  Patient Active Problem List   Diagnosis Date Noted   Chronic idiopathic constipation 07/28/2022   Hypertension 12/06/2021   Localized osteoarthritis of hand 12/06/2021   Hyperlipidemia associated with type 2 diabetes mellitus (HCC) 12/04/2019   Post-nasal drainage 11/22/2017   Vertigo 11/22/2017   Osteoporosis 08/24/2016   Type II diabetes mellitus with complication (HCC) 05/24/2016   History of adenomatous polyp of colon    Esophageal reflux 09/24/2010   Globus sensation 09/24/2010   Raynauds phenomenon 09/24/2010      Patient Care Team: Aida House, MD as PCP - General (Family Medicine) Greta Leatherwood, MD as Consulting Physician (Obstetrics and Gynecology) Bridgett Camps, Amber Bail, MD as Consulting Physician (Gastroenterology) Alver Austin, [facility] (Inactive) as Pharmacist (Pharmacist)   Outpatient Medications Prior to Visit  Medication Sig   alendronate  (FOSAMAX ) 70 MG tablet  Take 1 tablet (70 mg total) by mouth every 7 (seven) days. Take with a full glass of water on an empty stomach.   atorvastatin  (LIPITOR) 40 MG tablet TAKE 1 TABLET(40 MG) BY MOUTH DAILY   blood glucose meter kit and supplies KIT Dispense based on patient and insurance preference. Use up to four times daily as directed.   Calcium  Carbonate-Vit D-Min (CALCIUM  1200 PO) Take by mouth daily.   chlorpheniramine  (CHLOR-TRIMETON ) 4 MG tablet Take by mouth.   Cholecalciferol 125 MCG (5000 UT) TABS Take by mouth.   docusate sodium (COLACE) 100 MG capsule Take 100 mg by mouth 2 (two) times daily.   Empagliflozin -metFORMIN  HCl (SYNJARDY ) 5-500 MG TABS TAKE 1 TABLET BY MOUTH TWICE DAILY   fluticasone  (FLONASE ) 50 MCG/ACT nasal spray Place 2 sprays into the nose daily. (Patient taking differently: Place 2 sprays into the nose as needed.)   gabapentin  (NEURONTIN ) 100 MG capsule TAKE 1 CAPSULE(100 MG) BY MOUTH TWICE DAILY AS NEEDED   ibuprofen (ADVIL,MOTRIN) 200 MG tablet Take 200 mg by mouth as needed for mild pain.    losartan  (COZAAR ) 25 MG tablet TAKE 1 TABLET(25 MG) BY MOUTH DAILY   Multiple Vitamin (MULTIVITAMIN) capsule Take 1 capsule by mouth daily.   pantoprazole  (PROTONIX ) 40 MG tablet TAKE 1 TABLET(40 MG) BY MOUTH TWICE DAILY BEFORE A MEAL   No facility-administered medications prior to visit.    Review  of Systems  HENT:  Negative for hearing loss.   Eyes:  Negative for blurred vision.  Respiratory:  Negative for shortness of breath.   Cardiovascular:  Negative for chest pain.  Gastrointestinal: Negative.   Genitourinary: Negative.   Musculoskeletal:  Negative for back pain.  Neurological:  Negative for headaches.  Psychiatric/Behavioral:  Negative for depression.        Objective:     BP 136/76   Pulse 82   Temp 97.7 F (36.5 C) (Oral)   Ht 5' 0.5" (1.537 m)   Wt 116 lb 12.8 oz (53 kg)   LMP 01/10/1998   SpO2 98%   BMI 22.44 kg/m    Physical Exam Vitals reviewed.   Constitutional:      Appearance: Normal appearance. She is well-groomed and normal weight.  HENT:     Right Ear: Tympanic membrane and ear canal normal.     Left Ear: Tympanic membrane and ear canal normal.     Mouth/Throat:     Mouth: Mucous membranes are moist.     Pharynx: No posterior oropharyngeal erythema.  Eyes:     Conjunctiva/sclera: Conjunctivae normal.  Neck:     Thyroid : No thyromegaly.  Cardiovascular:     Rate and Rhythm: Normal rate and regular rhythm.     Pulses: Normal pulses.     Heart sounds: S1 normal and S2 normal.  Pulmonary:     Effort: Pulmonary effort is normal.     Breath sounds: Normal breath sounds and air entry.  Abdominal:     General: Abdomen is flat. Bowel sounds are normal.     Palpations: Abdomen is soft.  Musculoskeletal:     Right lower leg: No edema.     Left lower leg: No edema.  Lymphadenopathy:     Cervical: No cervical adenopathy.  Neurological:     Mental Status: She is alert and oriented to person, place, and time. Mental status is at baseline.     Gait: Gait is intact.  Psychiatric:        Mood and Affect: Mood and affect normal.        Speech: Speech normal.        Behavior: Behavior normal.        Judgment: Judgment normal.    Diabetic Foot Exam - Simple   Simple Foot Form Diabetic Foot exam was performed with the following findings: Yes 06/12/2023  9:36 AM  Visual Inspection No deformities, no ulcerations, no other skin breakdown bilaterally: Yes Sensation Testing Intact to touch and monofilament testing bilaterally: Yes Pulse Check Posterior Tibialis and Dorsalis pulse intact bilaterally: Yes Comments      Results for orders placed or performed in visit on 06/12/23  POC HgB A1c  Result Value Ref Range   Hemoglobin A1C 6.3 (A) 4.0 - 5.6 %   HbA1c POC (<> result, manual entry)     HbA1c, POC (prediabetic range)     HbA1c, POC (controlled diabetic range)         Assessment & Plan:    Routine Health Maintenance  and Physical Exam  Immunization History  Administered Date(s) Administered   Influenza, High Dose Seasonal PF 11/24/2016, 11/22/2017, 11/07/2018   Influenza-Unspecified 11/04/2018, 10/18/2019, 11/05/2021   PFIZER(Purple Top)SARS-COV-2 Vaccination 02/04/2019, 02/25/2019, 10/18/2019   PNEUMOCOCCAL CONJUGATE-20 06/02/2021   Pfizer Covid-19 Vaccine Bivalent Booster 9yrs & up 10/30/2020, 11/05/2021   Pneumococcal Conjugate-13 11/22/2017   Tdap 06/07/2013   Zoster Recombinant(Shingrix) 07/09/2019, 09/27/2019    Health Maintenance  Topic Date Due   COVID-19 Vaccine (6 - 2024-25 season) 09/11/2022   OPHTHALMOLOGY EXAM  05/09/2023   Diabetic kidney evaluation - eGFR measurement  06/03/2023   Diabetic kidney evaluation - Urine ACR  06/03/2023   DTaP/Tdap/Td (2 - Td or Tdap) 06/08/2023   FOOT EXAM  06/10/2023   INFLUENZA VACCINE  08/11/2023   Medicare Annual Wellness (AWV)  08/25/2023   MAMMOGRAM  12/07/2023   HEMOGLOBIN A1C  12/12/2023   Colonoscopy  10/07/2027   Pneumonia Vaccine 12+ Years old  Completed   DEXA SCAN  Completed   Hepatitis C Screening  Completed   Zoster Vaccines- Shingrix  Completed   HPV VACCINES  Aged Out   Meningococcal B Vaccine  Aged Out    Discussed health benefits of physical activity, and encouraged her to engage in regular exercise appropriate for her age and condition.  Type II diabetes mellitus with complication (HCC) -     POCT glycosylated hemoglobin (Hb A1C) -     Collection capillary blood specimen -     Comprehensive metabolic panel with GFR; Future -     Lipid panel; Future -     Microalbumin / creatinine urine ratio; Future  Routine general medical examination at a health care facility  Right hip pain -     DG HIP UNILAT W OR W/O PELVIS 2-3 VIEWS RIGHT; Future  Pt reported chronic right hip "clicking/catching" and states that it inhibits her ability to be physical active. Will check hip films to assess this problem. I counseled the patient  on the importance of physical activity and that if her hip was reducing her ability to exercise that we should start investigating the cause of the abnormality. Otherwise her physical exam findings are WNL, will order her annual labs for surveillance, foot exam performed today. Handouts given on healthy eating and exercise, health maintenance reviewed .  Return in 6 months (on 12/12/2023) for DM.     Aida House, MD

## 2023-06-14 ENCOUNTER — Ambulatory Visit: Payer: Self-pay | Admitting: Family Medicine

## 2023-06-19 DIAGNOSIS — R92322 Mammographic fibroglandular density, left breast: Secondary | ICD-10-CM | POA: Diagnosis not present

## 2023-06-19 DIAGNOSIS — N6489 Other specified disorders of breast: Secondary | ICD-10-CM | POA: Diagnosis not present

## 2023-06-19 DIAGNOSIS — N6321 Unspecified lump in the left breast, upper outer quadrant: Secondary | ICD-10-CM | POA: Diagnosis not present

## 2023-06-20 ENCOUNTER — Ambulatory Visit: Payer: Self-pay | Admitting: Obstetrics and Gynecology

## 2023-06-20 ENCOUNTER — Encounter: Payer: Self-pay | Admitting: Obstetrics and Gynecology

## 2023-06-30 DIAGNOSIS — H25811 Combined forms of age-related cataract, right eye: Secondary | ICD-10-CM | POA: Diagnosis not present

## 2023-06-30 DIAGNOSIS — H52221 Regular astigmatism, right eye: Secondary | ICD-10-CM | POA: Diagnosis not present

## 2023-07-20 ENCOUNTER — Other Ambulatory Visit: Payer: Self-pay | Admitting: Family Medicine

## 2023-09-04 ENCOUNTER — Other Ambulatory Visit: Payer: Self-pay | Admitting: Family Medicine

## 2023-09-04 DIAGNOSIS — E1169 Type 2 diabetes mellitus with other specified complication: Secondary | ICD-10-CM

## 2023-09-06 DIAGNOSIS — H2512 Age-related nuclear cataract, left eye: Secondary | ICD-10-CM | POA: Diagnosis not present

## 2023-09-08 DIAGNOSIS — H52222 Regular astigmatism, left eye: Secondary | ICD-10-CM | POA: Diagnosis not present

## 2023-09-08 DIAGNOSIS — H25812 Combined forms of age-related cataract, left eye: Secondary | ICD-10-CM | POA: Diagnosis not present

## 2023-09-11 ENCOUNTER — Other Ambulatory Visit: Payer: Self-pay | Admitting: Family Medicine

## 2023-09-11 DIAGNOSIS — E118 Type 2 diabetes mellitus with unspecified complications: Secondary | ICD-10-CM

## 2023-09-28 ENCOUNTER — Ambulatory Visit (INDEPENDENT_AMBULATORY_CARE_PROVIDER_SITE_OTHER): Admitting: Family Medicine

## 2023-09-28 ENCOUNTER — Encounter: Payer: Self-pay | Admitting: Family Medicine

## 2023-09-28 ENCOUNTER — Ambulatory Visit: Admitting: Family Medicine

## 2023-09-28 VITALS — Wt 118.0 lb

## 2023-09-28 DIAGNOSIS — Z0000 Encounter for general adult medical examination without abnormal findings: Secondary | ICD-10-CM | POA: Diagnosis not present
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
## 2023-10-09 ENCOUNTER — Other Ambulatory Visit: Payer: Self-pay | Admitting: Physician Assistant

## 2023-12-13 ENCOUNTER — Ambulatory Visit: Admitting: Family Medicine

## 2023-12-13 ENCOUNTER — Encounter: Payer: Self-pay | Admitting: Family Medicine

## 2023-12-13 VITALS — BP 138/80 | HR 94 | Temp 98.2°F | Ht 60.5 in | Wt 113.8 lb

## 2023-12-13 DIAGNOSIS — Z7984 Long term (current) use of oral hypoglycemic drugs: Secondary | ICD-10-CM | POA: Diagnosis not present

## 2023-12-13 DIAGNOSIS — E118 Type 2 diabetes mellitus with unspecified complications: Secondary | ICD-10-CM

## 2023-12-13 DIAGNOSIS — I10 Essential (primary) hypertension: Secondary | ICD-10-CM

## 2023-12-13 DIAGNOSIS — M810 Age-related osteoporosis without current pathological fracture: Secondary | ICD-10-CM | POA: Diagnosis not present

## 2023-12-13 LAB — POCT GLYCOSYLATED HEMOGLOBIN (HGB A1C): Hemoglobin A1C: 6.5 % — AB (ref 4.0–5.6)

## 2023-12-13 MED ORDER — ALENDRONATE SODIUM 70 MG PO TABS: 70.0000 mg | ORAL_TABLET | ORAL | 3 refills | Status: AC

## 2023-12-13 MED ORDER — SYNJARDY 5-500 MG PO TABS: 1.0000 | ORAL_TABLET | Freq: Two times a day (BID) | ORAL | 1 refills | Status: AC

## 2023-12-13 NOTE — Patient Instructions (Signed)
 Magnesium glycinate or oxide - start with 100 or 200 mg tablet at bedtime

## 2023-12-13 NOTE — Assessment & Plan Note (Signed)
BP is well controlled today in office, will continue losartan 25 mg daily.

## 2023-12-13 NOTE — Progress Notes (Signed)
 Established Patient Office Visit  Subjective   Patient ID: Stephanie Grimes, female    DOB: 02/24/1950  Age: 73 y.o. MRN: [id number]  Chief Complaint  Patient presents with   Medical Management of Chronic Issues    HPI Discussed the use of AI scribe software for clinical note transcription with the patient, who gave verbal consent to proceed.  History of Present Illness   Stephanie Grimes is a 73 year old female with diabetes who presents for a six-month follow-up visit.  Her hemoglobin A1c is 6.5, slightly higher than her previous result, and she has no new diabetes symptoms. She takes Synjardy .  She has increased tingling in her feet and more frequent severe leg cramps at night, sometimes in her ankle. Her feet are often cold without reported blood flow problems. She takes gabapentin , usually one at night, and an occasional extra dose for nerve pain.  She has new muscle cramps that she wonders may be related to atorvastatin  40 mg, which she now takes at night. Labs in June were normal, including potassium. She takes Fosamax  without difficulty and follows administration instructions.  She notes increased stress related to caring for her ill cat.  She had cataract surgery in August. She has a mammogram scheduled next week and is otherwise up to date on preventive care. Her next bone density test is due in 2026.       Current Outpatient Medications  Medication Instructions   alendronate  (FOSAMAX ) 70 mg, Oral, Every 7 days, Take with a full glass of water on an empty stomach.   atorvastatin  (LIPITOR) 40 MG tablet TAKE 1 TABLET(40 MG) BY MOUTH DAILY   blood glucose meter kit and supplies KIT Dispense based on patient and insurance preference. Use up to four times daily as directed.   Calcium  Carbonate-Vit D-Min (CALCIUM  1200 PO) Daily   chlorpheniramine  (CHLOR-TRIMETON ) 4 MG tablet Take by mouth.   Cholecalciferol 125 MCG (5000 UT) TABS Take by mouth.   docusate sodium (COLACE) 100 mg, 2  times daily   Empagliflozin -metFORMIN  HCl (SYNJARDY ) 5-500 MG TABS 1 tablet, Oral, 2 times daily   fluticasone  (FLONASE ) 50 MCG/ACT nasal spray 2 sprays, Nasal, Daily   gabapentin  (NEURONTIN ) 100 MG capsule TAKE 1 CAPSULE(100 MG) BY MOUTH TWICE DAILY AS NEEDED   ibuprofen (ADVIL) 200 mg, As needed   losartan  (COZAAR ) 25 MG tablet TAKE 1 TABLET(25 MG) BY MOUTH DAILY   Multiple Vitamin (MULTIVITAMIN) capsule 1 capsule, Daily   pantoprazole  (PROTONIX ) 40 mg, Oral, 2 times daily, Patient needs follow up appointment for future refills. Please call [phone] to schedule an appointment.    Patient Active Problem List   Diagnosis Date Noted   Chronic idiopathic constipation 07/28/2022   Hypertension 12/06/2021   Localized osteoarthritis of hand 12/06/2021   Hyperlipidemia associated with type 2 diabetes mellitus (HCC) 12/04/2019   Post-nasal drainage 11/22/2017   Vertigo 11/22/2017   Osteoporosis 08/24/2016   Type II diabetes mellitus with complication (HCC) 05/24/2016   History of adenomatous polyp of colon    Esophageal reflux 09/24/2010   Globus sensation 09/24/2010   Raynauds phenomenon 09/24/2010     Review of Systems  All other systems reviewed and are negative.     Objective:     BP 138/80   Pulse 94   Temp 98.2 F (36.8 C) (Oral)   Ht 5' 0.5 (1.537 m)   Wt 113 lb 12.8 oz (51.6 kg)   LMP 01/10/1998   SpO2 98%   BMI 21.86  kg/m    Physical Exam Vitals reviewed.  Constitutional:      Appearance: Normal appearance. She is well-groomed and normal weight.  Eyes:     Conjunctiva/sclera: Conjunctivae normal.  Neck:     Thyroid : No thyromegaly.  Cardiovascular:     Rate and Rhythm: Normal rate and regular rhythm.     Pulses: Normal pulses.     Heart sounds: S1 normal and S2 normal.  Pulmonary:     Effort: Pulmonary effort is normal.     Breath sounds: Normal breath sounds and air entry.  Abdominal:     General: Bowel sounds are normal.  Musculoskeletal:      Right lower leg: No edema.     Left lower leg: No edema.  Neurological:     Mental Status: She is alert and oriented to person, place, and time. Mental status is at baseline.     Gait: Gait is intact.  Psychiatric:        Mood and Affect: Mood and affect normal.        Speech: Speech normal.        Behavior: Behavior normal.        Judgment: Judgment normal.      Results for orders placed or performed in visit on 12/13/23  POC HgB A1c  Result Value Ref Range   Hemoglobin A1C 6.5 (A) 4.0 - 5.6 %   HbA1c POC (<> result, manual entry)     HbA1c, POC (prediabetic range)     HbA1c, POC (controlled diabetic range)        The 10-year ASCVD risk score (Arnett DK, et al., 2019) is: 32.7%    Assessment & Plan:  Type II diabetes mellitus with complication (HCC) -     POCT glycosylated hemoglobin (Hb A1C) -     Collection capillary blood specimen -     Synjardy ; Take 1 tablet by mouth 2 (two) times daily.  Dispense: 180 tablet; Refill: 1  Age-related osteoporosis without current pathological fracture -     Alendronate  Sodium; Take 1 tablet (70 mg total) by mouth every 7 (seven) days. Take with a full glass of water on an empty stomach.  Dispense: 12 tablet; Refill: 3  Primary hypertension Assessment & Plan: BP is well controlled today in office, will continue losartan  25 mg daily.     Assessment and Plan    Type 2 diabetes mellitus complicated by diabetic neuropathy Type 2 diabetes mellitus is well controlled with an A1c of 6.5%. Reports increased tingling in feet and leg cramps, particularly at night, suggestive of worsening diabetic neuropathy. - We discussed increasing her gabapentin , she will think about it. - Continue annual foot exams to monitor for complications.  Age-related osteoporosis Currently on Fosamax  with no reported issues. Bone density test is due in 2026. - Continue Fosamax  as prescribed. - Will schedule bone density test in 2026.  Muscle  cramps Experiencing severe muscle cramps, particularly at night, possibly related to atorvastatin  use? Labs from June were normal, including potassium levels. - Start magnesium supplement (magnesium glycinate or magnesium oxide) 100-200 mg at bedtime to alleviate muscle cramps. - Ensure adequate hydration throughout the day.  General health maintenance Up to date with eye exam and mammogram scheduled for next week. Blood pressure is slightly elevated at 138/80 mmHg but remains under 140 mmHg. - Continue current management of blood pressure. - Ensure follow-up with mammogram next week.        Return in about 6 months (around  06/12/2024) for annual physical exam.    Heron CHRISTELLA Sharper, MD

## 2024-01-02 ENCOUNTER — Encounter: Payer: Self-pay | Admitting: Obstetrics and Gynecology

## 2024-01-05 ENCOUNTER — Ambulatory Visit: Payer: Self-pay | Admitting: Obstetrics and Gynecology

## 2024-01-08 ENCOUNTER — Other Ambulatory Visit: Payer: Self-pay | Admitting: Family Medicine

## 2024-06-13 ENCOUNTER — Encounter: Admitting: Family Medicine
